# Patient Record
Sex: Female | Born: 1985
Health system: Southern US, Community
[De-identification: ages and names within clinical notes are randomized; demographics above are authoritative.]

## PROBLEM LIST (undated history)

## (undated) DIAGNOSIS — A630 Anogenital (venereal) warts: Secondary | ICD-10-CM

## (undated) HISTORY — PX: WISDOM TOOTH EXTRACTION: SHX21

## (undated) HISTORY — PX: TONSILLECTOMY AND ADENOIDECTOMY: SHX28

## (undated) HISTORY — PX: HYSTEROSCOPY: SHX211

## (undated) HISTORY — PX: NO PAST SURGERIES: SHX2092

---
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
## 2006-02-25 ENCOUNTER — Other Ambulatory Visit: Admission: RE | Admit: 2006-02-25 | Discharge: 2006-02-25 | Payer: Self-pay | Admitting: Obstetrics and Gynecology

## 2006-05-29 ENCOUNTER — Encounter: Admission: RE | Admit: 2006-05-29 | Discharge: 2006-05-29 | Payer: Self-pay | Admitting: Obstetrics and Gynecology

## 2009-04-06 ENCOUNTER — Encounter: Admission: RE | Admit: 2009-04-06 | Discharge: 2009-04-06 | Payer: Self-pay | Admitting: Family Medicine

## 2010-05-15 ENCOUNTER — Ambulatory Visit (HOSPITAL_COMMUNITY): Admission: RE | Admit: 2010-05-15 | Discharge: 2010-05-15 | Payer: Self-pay | Admitting: Obstetrics and Gynecology

## 2010-10-18 ENCOUNTER — Ambulatory Visit (HOSPITAL_COMMUNITY)
Admission: RE | Admit: 2010-10-18 | Discharge: 2010-10-18 | Payer: Self-pay | Source: Home / Self Care | Attending: Obstetrics and Gynecology | Admitting: Obstetrics and Gynecology

## 2010-11-05 NOTE — L&D Delivery Note (Signed)
Delivery Note At 5:18 PM a viable female was delivered via Vaginal, Spontaneous Delivery (Presentation: Right Occiput Anterior).  APGAR: 8, 9; weight 7 lb 12.5 oz (3530 g).   Placenta status: Intact, Spontaneous.  Cord: 3 vessels with the following complications: Nuchal  Anesthesia: Epidural  Episiotomy: none Lacerations: 1st degree;Labial Suture Repair: 3.0 vicryl rapide Est. Blood Loss (mL): 300  Mom to postpartum.  Baby to nursery-stable.  A -, RI, IUD? - check benefits, Breast Martha Coleman  Coleman,Martha Cardozo 06/12/2011, 5:51 PM

## 2011-02-10 LAB — HIV ANTIBODY (ROUTINE TESTING W REFLEX): HIV: NONREACTIVE

## 2011-04-12 LAB — STREP B DNA PROBE: GBS: NEGATIVE

## 2011-06-02 ENCOUNTER — Encounter (HOSPITAL_COMMUNITY): Payer: Self-pay | Admitting: *Deleted

## 2011-06-02 ENCOUNTER — Inpatient Hospital Stay (HOSPITAL_COMMUNITY)
Admission: AD | Admit: 2011-06-02 | Discharge: 2011-06-02 | Disposition: A | Discharge status: Home / Self Care | Payer: Medicaid Other | Source: Ambulatory Visit | Attending: Obstetrics and Gynecology | Admitting: Obstetrics and Gynecology

## 2011-06-02 DIAGNOSIS — O479 False labor, unspecified: Secondary | ICD-10-CM | POA: Insufficient documentation

## 2011-06-02 HISTORY — DX: Anogenital (venereal) warts: A63.0

## 2011-06-02 NOTE — Progress Notes (Signed)
Dr. Jackelyn Knife notified of cervical exam unchanged from previous exam. Plan to discharge patient home with labor precautions. Pain management discussed with pt and Dr. Rock Nephew declines the need for pain medication at this time.

## 2011-06-02 NOTE — Progress Notes (Signed)
Pt reports ctx every 3-4 min snce 0430. Reports some leaking of clear fluid no big gush

## 2011-06-02 NOTE — Progress Notes (Signed)
Dr. Jackelyn Knife notified of vaginal exam 3, 60 -2, Prior exam in the office was the same. Contraction are 2-4 mins apart mild/mod. bulging Bag of water felt on exam. Plan to keep pt 1 hr- allow her to walk and recheck cervix.

## 2011-06-02 NOTE — Progress Notes (Signed)
Pt presents to [facility] with complaints of contractions that started at 0400 this AM. Pt is a G2P0 at 38 weeks 2days.

## 2011-06-02 NOTE — Progress Notes (Signed)
Monitor removed for patient to walk times 1hr.

## 2011-06-11 ENCOUNTER — Other Ambulatory Visit: Payer: Self-pay | Admitting: Obstetrics and Gynecology

## 2011-06-12 ENCOUNTER — Inpatient Hospital Stay (HOSPITAL_COMMUNITY)
Admission: RE | Admit: 2011-06-12 | Discharge: 2011-06-14 | DRG: 775 | Disposition: A | Discharge status: Home / Self Care | Payer: Medicaid Other | Source: Ambulatory Visit | Attending: Obstetrics and Gynecology | Admitting: Obstetrics and Gynecology

## 2011-06-12 ENCOUNTER — Encounter (HOSPITAL_COMMUNITY): Payer: Self-pay

## 2011-06-12 ENCOUNTER — Encounter (HOSPITAL_COMMUNITY): Payer: Self-pay | Admitting: Anesthesiology

## 2011-06-12 ENCOUNTER — Inpatient Hospital Stay (HOSPITAL_COMMUNITY): Payer: Medicaid Other | Admitting: Anesthesiology

## 2011-06-12 DIAGNOSIS — Z3490 Encounter for supervision of normal pregnancy, unspecified, unspecified trimester: Secondary | ICD-10-CM

## 2011-06-12 LAB — CBC
HCT: 33 % — ABNORMAL LOW (ref 36.0–46.0)
RDW: 13 % (ref 11.5–15.5)
WBC: 12.9 10*3/uL — ABNORMAL HIGH (ref 4.0–10.5)

## 2011-06-12 LAB — RPR
RPR Ser Ql: NONREACTIVE
RPR: NONREACTIVE

## 2011-06-12 LAB — TYPE AND SCREEN: Antibody Screen: NEGATIVE

## 2011-06-12 MED ORDER — SENNOSIDES-DOCUSATE SODIUM 8.6-50 MG PO TABS
2.0000 | ORAL_TABLET | Freq: Every day | ORAL | Status: DC
  Administered 2011-06-12 – 2011-06-13 (×2): 2 via ORAL

## 2011-06-12 MED ORDER — OXYCODONE-ACETAMINOPHEN 5-325 MG PO TABS: 2.0000 | ORAL_TABLET | ORAL | Status: DC | PRN

## 2011-06-12 MED ORDER — SIMETHICONE 80 MG PO CHEW
80.0000 mg | CHEWABLE_TABLET | ORAL | Status: DC | PRN
  Administered 2011-06-12: 80 mg via ORAL

## 2011-06-12 MED ORDER — PHENYLEPHRINE 40 MCG/ML (10ML) SYRINGE FOR IV PUSH (FOR BLOOD PRESSURE SUPPORT)
PREFILLED_SYRINGE | INTRAVENOUS | Status: AC
  Filled 2011-06-12: qty 5

## 2011-06-12 MED ORDER — OXYTOCIN 20 UNITS IN LACTATED RINGERS INFUSION - SIMPLE: 125.0000 mL/h | Freq: Once | INTRAVENOUS | Status: DC

## 2011-06-12 MED ORDER — LIDOCAINE HCL (PF) 1 % IJ SOLN
30.0000 mL | INTRAMUSCULAR | Status: DC | PRN
  Administered 2011-06-12: 30 mL via SUBCUTANEOUS
  Filled 2011-06-12: qty 30

## 2011-06-12 MED ORDER — PRENATAL PLUS 27-1 MG PO TABS: 1.0000 | ORAL_TABLET | Freq: Every day | ORAL | Status: DC

## 2011-06-12 MED ORDER — OXYTOCIN 20 UNITS IN LACTATED RINGERS INFUSION - SIMPLE: 125.0000 mL/h | INTRAVENOUS | Status: DC | PRN

## 2011-06-12 MED ORDER — LIDOCAINE HCL 1.5 % IJ SOLN
INTRAMUSCULAR | Status: DC | PRN
  Administered 2011-06-12 (×2): 5 mL via EPIDURAL

## 2011-06-12 MED ORDER — DIBUCAINE 1 % RE OINT: 1.0000 "application " | TOPICAL_OINTMENT | RECTAL | Status: DC | PRN

## 2011-06-12 MED ORDER — CITRIC ACID-SODIUM CITRATE 334-500 MG/5ML PO SOLN: 30.0000 mL | ORAL | Status: DC | PRN

## 2011-06-12 MED ORDER — FENTANYL 2.5 MCG/ML BUPIVACAINE 1/10 % EPIDURAL INFUSION (WH - ANES)
INTRAMUSCULAR | Status: AC
  Filled 2011-06-12: qty 60

## 2011-06-12 MED ORDER — LIDOCAINE HCL (PF) 1 % IJ SOLN: 30.0000 mL | INTRAMUSCULAR | Status: DC | PRN

## 2011-06-12 MED ORDER — SODIUM CHLORIDE 0.9 % IJ SOLN: 3.0000 mL | Freq: Two times a day (BID) | INTRAMUSCULAR | Status: DC

## 2011-06-12 MED ORDER — LANOLIN HYDROUS EX OINT: TOPICAL_OINTMENT | CUTANEOUS | Status: DC | PRN

## 2011-06-12 MED ORDER — LACTATED RINGERS IV SOLN: 500.0000 mL | INTRAVENOUS | Status: DC | PRN

## 2011-06-12 MED ORDER — ZOLPIDEM TARTRATE 5 MG PO TABS: 5.0000 mg | ORAL_TABLET | Freq: Every evening | ORAL | Status: DC | PRN

## 2011-06-12 MED ORDER — TERBUTALINE SULFATE 1 MG/ML IJ SOLN: 0.2500 mg | Freq: Once | INTRAMUSCULAR | Status: DC | PRN

## 2011-06-12 MED ORDER — IBUPROFEN 600 MG PO TABS: 600.0000 mg | ORAL_TABLET | Freq: Four times a day (QID) | ORAL | Status: DC | PRN

## 2011-06-12 MED ORDER — DIPHENHYDRAMINE HCL 25 MG PO CAPS: 25.0000 mg | ORAL_CAPSULE | Freq: Four times a day (QID) | ORAL | Status: DC | PRN

## 2011-06-12 MED ORDER — OXYTOCIN 10 UNIT/ML IJ SOLN
INTRAMUSCULAR | Status: AC
  Filled 2011-06-12: qty 2

## 2011-06-12 MED ORDER — EPHEDRINE 5 MG/ML INJ
INTRAVENOUS | Status: AC
  Filled 2011-06-12: qty 4

## 2011-06-12 MED ORDER — ONDANSETRON HCL 4 MG/2ML IJ SOLN: 4.0000 mg | Freq: Four times a day (QID) | INTRAMUSCULAR | Status: DC | PRN

## 2011-06-12 MED ORDER — CALCIUM CARBONATE ANTACID 500 MG PO CHEW: 1.0000 | CHEWABLE_TABLET | Freq: Every day | ORAL | Status: DC | PRN

## 2011-06-12 MED ORDER — FENTANYL 2.5 MCG/ML BUPIVACAINE 1/10 % EPIDURAL INFUSION (WH - ANES)
INTRAMUSCULAR | Status: DC | PRN
  Administered 2011-06-12: 16:00:00
  Administered 2011-06-12: 14 mL/h via EPIDURAL

## 2011-06-12 MED ORDER — ACETAMINOPHEN 325 MG PO TABS: 650.0000 mg | ORAL_TABLET | ORAL | Status: DC | PRN

## 2011-06-12 MED ORDER — FENTANYL 2.5 MCG/ML BUPIVACAINE 1/10 % EPIDURAL INFUSION (WH - ANES)
INTRAMUSCULAR | Status: AC
  Administered 2011-06-12: 14:00:00
  Filled 2011-06-12: qty 60

## 2011-06-12 MED ORDER — IBUPROFEN 600 MG PO TABS
600.0000 mg | ORAL_TABLET | Freq: Four times a day (QID) | ORAL | Status: DC
  Administered 2011-06-13 – 2011-06-14 (×7): 600 mg via ORAL
  Filled 2011-06-12 (×7): qty 1

## 2011-06-12 MED ORDER — ONDANSETRON HCL 4 MG PO TABS: 4.0000 mg | ORAL_TABLET | ORAL | Status: DC | PRN

## 2011-06-12 MED ORDER — OXYTOCIN 20 UNITS IN LACTATED RINGERS INFUSION - SIMPLE
1.0000 m[IU]/min | INTRAVENOUS | Status: DC
  Administered 2011-06-12: 333 m[IU]/min via INTRAVENOUS
  Administered 2011-06-12: 6 m[IU]/min via INTRAVENOUS
  Administered 2011-06-12: 2 m[IU]/min via INTRAVENOUS
  Filled 2011-06-12: qty 1000

## 2011-06-12 MED ORDER — WITCH HAZEL-GLYCERIN EX PADS: 1.0000 "application " | MEDICATED_PAD | CUTANEOUS | Status: DC | PRN

## 2011-06-12 MED ORDER — PRENATAL PLUS 27-1 MG PO TABS
1.0000 | ORAL_TABLET | Freq: Every day | ORAL | Status: DC
  Administered 2011-06-12 – 2011-06-14 (×3): 1 via ORAL
  Filled 2011-06-12 (×3): qty 1

## 2011-06-12 MED ORDER — SODIUM CHLORIDE 0.9 % IV SOLN: 250.0000 mL | INTRAVENOUS | Status: DC

## 2011-06-12 MED ORDER — FLEET ENEMA 7-19 GM/118ML RE ENEM: 1.0000 | ENEMA | RECTAL | Status: DC | PRN

## 2011-06-12 MED ORDER — IBUPROFEN 600 MG PO TABS
600.0000 mg | ORAL_TABLET | Freq: Four times a day (QID) | ORAL | Status: DC | PRN
  Administered 2011-06-12: 600 mg via ORAL
  Filled 2011-06-12: qty 1

## 2011-06-12 MED ORDER — LACTATED RINGERS IV SOLN
INTRAVENOUS | Status: DC
  Administered 2011-06-12: 1000 mL via INTRAVENOUS

## 2011-06-12 MED ORDER — LACTATED RINGERS IV SOLN: INTRAVENOUS | Status: DC

## 2011-06-12 MED ORDER — BENZOCAINE-MENTHOL 20-0.5 % EX AERO: 1.0000 "application " | INHALATION_SPRAY | CUTANEOUS | Status: DC | PRN

## 2011-06-12 MED ORDER — SODIUM CHLORIDE 0.9 % IJ SOLN: 3.0000 mL | INTRAMUSCULAR | Status: DC | PRN

## 2011-06-12 MED ORDER — OXYCODONE-ACETAMINOPHEN 5-325 MG PO TABS
2.0000 | ORAL_TABLET | ORAL | Status: DC | PRN
  Administered 2011-06-12: 2 via ORAL
  Filled 2011-06-12 (×2): qty 1

## 2011-06-12 MED ORDER — TETANUS-DIPHTH-ACELL PERTUSSIS 5-2.5-18.5 LF-MCG/0.5 IM SUSP
0.5000 mL | Freq: Once | INTRAMUSCULAR | Status: AC
  Administered 2011-06-13: 0.5 mL via INTRAMUSCULAR
  Filled 2011-06-12: qty 0.5

## 2011-06-12 NOTE — Anesthesia Procedure Notes (Signed)
Epidural Patient location during procedure: OB Start time: 06/12/2011 11:03 AM  Staffing Performed by: anesthesiologist   Preanesthetic Checklist Completed: patient identified, site marked, surgical consent, pre-op evaluation, timeout performed, IV checked, risks and benefits discussed and monitors and equipment checked  Epidural Patient position: sitting Prep: site prepped and draped and DuraPrep Patient monitoring: continuous pulse ox and blood pressure Approach: midline Injection technique: LOR air and LOR saline  Needle:  Needle type: Tuohy  Needle gauge: 17 G Needle length: 9 cm Needle insertion depth: 5 cm cm Catheter type: closed end flexible Catheter size: 19 Gauge Catheter at skin depth: 10 cm Test dose: negative  Assessment Events: blood not aspirated, injection not painful, no injection resistance, negative IV test and no paresthesia  Additional Notes Patient identified.  Risk benefits discussed including failed block, incomplete pain control, headache, nerve damage, paralysis, blood pressure changes, nausea, vomiting, reactions to medication both toxic or allergic, and postpartum back pain.  Patient expressed understanding and wished to proceed.  All questions were answered.  Sterile technique used throughout procedure and epidural site dressed with sterile barrier dressing. No paresthesia or other complications noted.The patient did not experience any signs of intravascular injection such as tinnitus or metallic taste in mouth nor signs of intrathecal spread such as rapid motor block. Please see nursing notes for vital signs.

## 2011-06-12 NOTE — Progress Notes (Signed)
AROM performed for copious clear fluid, w/o diff/comp SVE 5/80/-2 J BOVARD

## 2011-06-12 NOTE — Progress Notes (Signed)
Martha Coleman is a 25 y.o. G2P0010 at [redacted]w[redacted]d  admitted for induction of labor due to Elective at term.  Subjective: comf with epidural  Objective: BP 126/91  Pulse 83  Temp(Src) 98 F (36.7 C) (Tympanic)  Resp 20  Ht 5\' 4"  (1.626 m)  Wt 78.019 kg (172 lb)  BMI 29.52 kg/m2  SpO2 100%     gen NAD FHT:  FHR: 120-130 bpm, variability: moderate,  accelerations:  Present,  decelerations:  Absent UC:   regular, every 2-3 minutes SVE:   Dilation:7.8 Effacement (%): 90 Station: 0/+1 Exam by:: Bovard, MD  Labs: Lab Results  Component Value Date   WBC 12.9* 06/12/2011   HGB 11.1* 06/12/2011   HCT 33.0* 06/12/2011   MCV 82.5 06/12/2011   PLT 199 06/12/2011    Assessment / Plan: Induction of labor due to term with favorable cervix,  progressing well on pitocin  Labor: Progressing normally Preeclampsia:  no signs or symptoms of toxicity Fetal Wellbeing:  Category II Pain Control:  Epidural  Anticipated MOD:  NSVD  BOVARD,Serria Sloma 06/12/2011, 2:02 PM

## 2011-06-12 NOTE — Anesthesia Preprocedure Evaluation (Signed)
Anesthesia Evaluation  Name, MR# and DOB Patient awake  General Assessment Comment  Reviewed: Allergy & Precautions, H&P , Patient's Chart, lab work & pertinent test results  Airway Mallampati: II TM Distance: >3 FB Neck ROM: full    Dental No notable dental hx.    Pulmonary  clear to auscultation  pulmonary exam normalPulmonary Exam Normal breath sounds clear to auscultation none    Cardiovascular regular Normal    Neuro/Psych Negative Neurological ROS  Negative Psych ROS  GI/Hepatic/Renal negative GI ROS, negative Liver ROS, and negative Renal ROS (+)       Endo/Other  Negative Endocrine ROS (+)      Abdominal   Musculoskeletal   Hematology negative hematology ROS (+)   Peds  Reproductive/Obstetrics (+) Pregnancy    Anesthesia Other Findings             Anesthesia Physical Anesthesia Plan  ASA: II  Anesthesia Plan: Epidural   Post-op Pain Management:    Induction:   Airway Management Planned:   Additional Equipment:   Intra-op Plan:   Post-operative Plan:   Informed Consent: I have reviewed the patients History and Physical, chart, labs and discussed the procedure including the risks, benefits and alternatives for the proposed anesthesia with the patient or authorized representative who has indicated his/her understanding and acceptance.     Plan Discussed with:   Anesthesia Plan Comments:         Anesthesia Quick Evaluation  

## 2011-06-12 NOTE — H&P (Addendum)
TAJAI SUDER is a 25 y.o. female presenting for iol given term status and favorable cervix.  +FM, no LOF, no VB, occ ctx.  D/w pt iol inc R/B/A wishes to proceed.   History OB History    Grav Para Term Preterm Abortions TAB SAB Ect Mult Living   2 0 0 0 1 0 1 0 0 0     G1 SAB, G2 present; no abn pap, no STDs  Past Medical History  Diagnosis Date  . Genital warts   . 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 No past surgeries    Family History: Br Ca Social History:  reports that she has never smoked. She does not have any smokeless tobacco history on file. She reports that she does not drink alcohol or use illicit drugs. engaged  PNL A neg, Ab Scr neg, RI, HepbsAg neg, HIV neg, plt 199k, GBBS neg, RPR NR; Rhogam given, ASCUS HR HPV neg, GC-, Chl -, CF neg, AFP and 1st tri screen declined, glucola 121 1st tri [place] cwd EDC 8/9 [place] cwd, nl anat, Lt wall plac, female  Review of Systems  Constitutional: Negative.   HENT: Negative.   Eyes: Negative.   Respiratory: Negative.   Cardiovascular: Negative.   Gastrointestinal: Negative.   Genitourinary: Negative.   Musculoskeletal: Negative.   Skin: Negative.   Neurological: Negative.   Endo/Heme/Allergies: Negative.   Psychiatric/Behavioral: Negative.       Blood pressure 125/98, pulse 74, temperature 98.4 F (36.9 C), temperature source Oral, resp. rate 20, height 5\' 4"  (1.626 m), weight 78.019 kg (172 lb). Exam Physical Exam  Constitutional: She is oriented to person, place, and time. She appears well-developed and well-nourished.  HENT:  Head: Normocephalic and atraumatic.  Neck: Normal range of motion. Neck supple.  Cardiovascular: Normal rate and regular rhythm.   Respiratory: Effort normal and breath sounds normal.  GI: Soft.  Neurological: She is alert and oriented to person, place, and time. She has normal reflexes.     Assessment/Plan: 25yo G2P0010  at 39+ for iol given favorable cervix, term status Epidural for pain gbbs neg AROM and pitocin to augment   BOVARD,Kennia Vanvorst 06/12/2011, 9:11 AM

## 2011-06-13 LAB — CBC
HCT: 28 % — ABNORMAL LOW (ref 36.0–46.0)
Hemoglobin: 9.4 g/dL — ABNORMAL LOW (ref 12.0–15.0)
MCH: 28 pg (ref 26.0–34.0)
MCHC: 33.6 g/dL (ref 30.0–36.0)
MCV: 83.3 fL (ref 78.0–100.0)

## 2011-06-13 MED ORDER — OXYCODONE-ACETAMINOPHEN 5-325 MG PO TABS
1.0000 | ORAL_TABLET | ORAL | Status: DC | PRN
  Administered 2011-06-13 – 2011-06-14 (×3): 1 via ORAL
  Filled 2011-06-13 (×3): qty 1

## 2011-06-13 NOTE — Progress Notes (Signed)
Post Partum Day 1 Subjective: no complaints, tolerating PO and nl lochia and pain controlled.  Some cramping  Objective: Blood pressure 102/68, pulse 99, temperature 97.2 F (36.2 C), temperature source Oral, resp. rate 20, height 5\' 4"  (1.626 m), weight 78.019 kg (172 lb), SpO2 97.00%, unknown if currently breastfeeding.  Physical Exam:  General: alert and no distress Lochia: appropriate Uterine Fundus: firm    Basename 06/13/11 0512 06/12/11 0807  HGB 9.4* 11.1*  HCT 28.0* 33.0*    Assessment/Plan: Plan for discharge tomorrow and Contraception IUD Lactation consult.   LOS: 1 day   BOVARD,Kaisyn Reinhold 06/13/2011, 8:07 AM

## 2011-06-13 NOTE — Consult Note (Signed)
BREASTFEEDING CONSULTATION SERVICES INFORMATION LEFT WITH PATIENT.  PATIENT STATES BABY HAS BEEN SLEEPY AT FEEDINGS.  REVIEWED WAKING TECHNIQUES AND BASIC BREASTFEEDING TEACHING DONE.  BABY PLACED SKIN TO SKIN WITH GOOD FEEDING CUES.  BABY LATCHED EASILY AND NURSED WELL WITH GOOD SUCK/SWALLOWS.  COMFORT GELS GIVEN FOR SORE NIPPLES.  ENCOURAGED TO CALL FOR CONCERNS/ASSIST.

## 2011-06-13 NOTE — Progress Notes (Signed)
UR chart review completed.  

## 2011-06-14 MED ORDER — OXYCODONE-ACETAMINOPHEN 5-325 MG PO TABS: 1.0000 | ORAL_TABLET | ORAL | Status: AC | PRN

## 2011-06-14 MED ORDER — PRENATAL PLUS 27-1 MG PO TABS: 1.0000 | ORAL_TABLET | Freq: Every day | ORAL | Status: DC

## 2011-06-14 MED ORDER — IBUPROFEN 600 MG PO TABS: 600.0000 mg | ORAL_TABLET | Freq: Four times a day (QID) | ORAL | Status: AC

## 2011-06-14 NOTE — Progress Notes (Signed)
Mother has flat nipples and has bilateral positional strips. Infant has have only shallow latch. Multiple attempts until latch was sustained. Infant breast fed for 20-25 mins. inst mother in use of nipple shield , 2ml of EBM placed in NS and infant maintained good sucking for 3-5 mins. inst mother to only use nipple shield if unable to latch infant. inst to feed infant any amt of EBM pumped every 3 hrs. inst mother in hand exp and breast compression inst mother to pre pump piror to attempting to latch.. Mother rented [facility] loaner and also schedules for fup lc visit. August 15 at 2:30. Written plan given to mother . Mother states good understanding of plan.

## 2011-06-14 NOTE — Anesthesia Postprocedure Evaluation (Signed)
  Anesthesia Post Note  Patient: Martha Coleman  Procedure(s) Performed: * No procedures listed *  Anesthesia type: Epidural  Patient location: Mother/Baby  Post pain: Pain level controlled  Post assessment: Post-op Vital signs reviewed  Last Vitals:  Filed Vitals:   06/14/11 0624  BP: 107/72  Pulse: 80  Temp: 98.1 F (36.7 C)  Resp: 16    Post vital signs: Reviewed  Level of consciousness: awake  Complications: No apparent anesthesia complications

## 2011-06-14 NOTE — Discharge Summary (Signed)
Obstetric Discharge Summary Reason for Admission: induction of labor Prenatal Procedures: none Intrapartum Procedures: spontaneous vaginal delivery Postpartum Procedures: none Complications-Operative and Postpartum: vaginal laceration Hemoglobin  Date Value Range Status  06/13/2011 9.4* 12.0-15.0 (g/dL) Final     HCT  Date Value Range Status  06/13/2011 28.0* 36.0-46.0 (%) Final    Discharge Diagnoses: Term Pregnancy-delivered  Discharge Information: Date: 06/14/2011 Activity: pelvic rest Diet: routine Medications: PNV, Ibuprophen and Percocet Condition: stable Instructions: refer to practice specific booklet Discharge to: home   Newborn Data: Live born female  Birth Weight: 7 lb 12.5 oz (3530 g) APGAR: 8, 9  Home with mother.  Coleman,Martha Lightsey 06/14/2011, 7:18 AM

## 2011-06-14 NOTE — Progress Notes (Signed)
Post Partum Day 2 Subjective: no complaints and tolerating POnl lochia, pain controlled  Objective: Blood pressure 107/72, pulse 80, temperature 98.1 F (36.7 C), temperature source Oral, resp. rate 16, height 5\' 4"  (1.626 m), weight 78.019 kg (172 lb), SpO2 97.00%, unknown if currently breastfeeding.  Physical Exam:  General: alert and no distress Lochia: appropriate Uterine Fundus: firm    Basename 06/13/11 0512 06/12/11 0807  HGB 9.4* 11.1*  HCT 28.0* 33.0*    Assessment/Plan: Discharge home with Motrin, Persocet, PNV, f/u 6 weeks.     LOS: 2 days   BOVARD,Aliscia Clayton 06/14/2011, 6:52 AM

## 2011-06-20 ENCOUNTER — Ambulatory Visit (HOSPITAL_COMMUNITY)
Admit: 2011-06-20 | Discharge: 2011-06-20 | Disposition: A | Discharge status: Home / Self Care | Payer: Medicaid Other | Attending: Obstetrics and Gynecology | Admitting: Obstetrics and Gynecology

## 2011-06-20 NOTE — Progress Notes (Signed)
Adult Lactation Consultation Outpatient Visit Note  Patient Name: Martha Coleman Date of Birth: Sep 24, 1986 Gestational Age at Delivery: Unknown Type of Delivery:   Breastfeeding History: Frequency of Breastfeeding:Can only get to latch 2 times a day  Length of Feeding:Maybe 10 minutes, but not sucking the whole time  Voids: 10 saturated Stools: 5 to 6 yellow mustard color seedy  Supplementing / Method: Pumping:  Type of Pump:Got Lactina pump Monday - Was using Harmony before then   Frequency:Trying to pump every 2 1/2 to 3 hours .  Volume:  3 1/2 oz total (Feeding baby 2 1/2 oz every 3 hours).  Before mature milk came in was only pumping 1 oz.  Comments:  Mom has been having latch issues since birth d/t flat nipples and causing mom nipple tenderness.  Has been using # 24 nipple shield, but Martha Coleman usually refuses.  Has only been able to get her to breastfeed maybe twice a day leaving her on the breast for 10 minutes, but not sucking the whole time.  Was seen by home visit nurse today and had gained 1 1/2 oz over birth weight (7lb 13 1/2 oz) totally nude.  Martha Coleman had jaundice probably d/t poor latching and not enough milk exchange.  When Martha Coleman was discharged from the hospital her bilirubin was 6.4.  The next day at the [facility] office it was 16.4 resulting in more lethargy and poor feeding.  Supplementation started that day of 1/2 formula and 1/2 breastmilk.  The day supplementation was started, Martha Coleman was seen back in the hospital for throwing up per mom and bili was down to 13 then.  Martha Coleman got used to bottles and then really had latch issues.  Mom's mature milk transitioned in 2 days ago.  Mom's full breasts interfered more with Martha Coleman's latch.  Came in for consult today at 29 days old to get her back to the breast if possible.   Consultation Evaluation: Pre pumped mom to help evert nipple and initiate flow and got 25ml from left breast after 5 minutes.  Shown how to sandwich breast.  Martha Coleman latched  without aid of nipple shield and began good rhythmic sucking and swallowing.  Took 34ml from left breast after 10 to 15 minutes sucking for most of the time at the breast stimulating occasionally and massaging breast to keep sucking.  Mom latched Martha Coleman onto right breast without assistance from [facility] pre pumping and sandwiching breast as demonstrated on first breast and she took 28ml after 10 minutes and came off satiated taking a total of 62ml.  According to present weight calculations, Martha Coleman should be taking in 2.6 oz every 3 hours to continue to gain weight and grow.      Initial Feeding Assessment: Pre-feed Weight:3574gms (7-14.2 oz) Post-feed Weight:3602 gms (7-15.3 oz) Amount Transferred:68ml Comments:6ml taken from left breast after 10 to 15 minutes.  Changed moderate yellow seedy stool before feeding and large yellow seedy stool between breasts  Additional Feeding Assessment: Pre-feed Weight:3590 gms(7-14.5) Post-feed Weight:3618 gms (7-15.5 Amount Transferred:41ml Comments:75ml taken from right breast after 10 minutes.  Changed another moderate yellow seedy stool after right breast  Additional Feeding Assessment: Pre-feed Weight: Post-feed Weight: Amount Transferred: Comments:  Total Breast milk Transferred this Visit: 62ml (gms) Total Supplement Given:   Additional Interventions:  Encouraged mom to continue to put Martha Coleman to the breast even if she requests to feed less than every 2 to 3 hours for the next 24 to 48 hours to get her used  to solely breastfeeding only giving the bottle (expressed breastmilk) if absolutely necessary.  Continue to pre pump if needed to help nipple evert and initiate flow.  Decreased nipple size to #20 from #24, but only use if cannot achieve latch otherwise.  If cannot get to latch with or without nipple shield, give 2.5 to 3 oz via bottle.  Signs of adequate intake reviewed with mom and told to call with any problems, concerns or if needed earlier f/u lactation  out patient consult.      Follow-Up  Set up f/u out patient lactation consult for August 21st at 4pm.  But if concerned before then about adequate intake, please call. Has f/u 1 month Pediatric appointment with Dr. Genelle Bal at [facility].    Louis Meckel 06/20/2011, 2:30 PM

## 2011-06-26 ENCOUNTER — Ambulatory Visit (HOSPITAL_COMMUNITY): Payer: Medicaid Other | Attending: Obstetrics and Gynecology

## 2013-06-01 ENCOUNTER — Ambulatory Visit: Payer: Self-pay

## 2013-06-01 ENCOUNTER — Ambulatory Visit (INDEPENDENT_AMBULATORY_CARE_PROVIDER_SITE_OTHER): Payer: No Typology Code available for payment source | Admitting: Family Medicine

## 2013-06-01 ENCOUNTER — Ambulatory Visit: Payer: No Typology Code available for payment source

## 2013-06-01 VITALS — BP 110/66 | HR 71 | Temp 98.4°F | Resp 18 | Ht 64.0 in | Wt 162.0 lb

## 2013-06-01 DIAGNOSIS — S93409A Sprain of unspecified ligament of unspecified ankle, initial encounter: Secondary | ICD-10-CM

## 2013-06-01 DIAGNOSIS — M79609 Pain in unspecified limb: Secondary | ICD-10-CM

## 2013-06-01 MED ORDER — HYDROCODONE-ACETAMINOPHEN 5-325 MG PO TABS: 1.0000 | ORAL_TABLET | Freq: Three times a day (TID) | ORAL | Status: DC | PRN

## 2013-06-01 MED ORDER — TRAMADOL HCL 50 MG PO TABS: 50.0000 mg | ORAL_TABLET | Freq: Three times a day (TID) | ORAL | Status: DC | PRN

## 2013-06-01 NOTE — Patient Instructions (Addendum)
Please let me know if your ankle is not better in the next several days.   Elevated and ice your ankle as needed- try to ice it at least twice a day Use your ankle support as needed.    Use the tramadol as needed, but it can cause drowsiness so use caution.

## 2013-06-01 NOTE — Progress Notes (Signed)
[facility] [address] [place] [address] [phone]  Date:  06/01/2013   Name:  Martha Coleman   DOB:  09-25-1986   MRN:  [id number]  PCP:  No PCP Per Patient    Chief Complaint: injury to left ankle saturday   History of Present Illness:  CARINE NORDGREN is a 27 y.o. very pleasant female patient who presents with the following:  2 days ago she was working out in the yard and injured her right ankle. She is not sure exactly what happened to her ankle, but she twisted it somehow and fell.  She is able to walk on it, but it does hurt to dorsiflex the foot.  She has more pain over the lateral and dorsal foot.   She is otherwise generally healthy.   She had a baby 2 years ago and is not breastfeeding at this time.  She is using ibuprofen as needed for her ankle pain  She has an IUD.    Patient Active Problem List   Diagnosis Date Noted  . SVD (spontaneous vaginal delivery) 06/12/2011    Past Medical History  Diagnosis Date  . Genital warts   . Normal pregnancy 06/12/2011    Past Surgical History  Procedure Laterality Date  . No past surgeries      History  Substance Use Topics  . Smoking status: Never Smoker   . Smokeless tobacco: Not on file  . Alcohol Use: No    History reviewed. No pertinent family history.  No Known Allergies  Medication list has been reviewed and updated.  Current Outpatient Prescriptions on File Prior to Visit  Medication Sig Dispense Refill  . acetaminophen (TYLENOL) 500 MG tablet Take 500 mg by mouth every 6 (six) hours as needed. For pain       . calcium carbonate (TUMS - DOSED IN MG ELEMENTAL CALCIUM) 500 MG chewable tablet Chew 1 tablet by mouth daily as needed. For heartburn      . calcium carbonate (TUMS - DOSED IN MG ELEMENTAL CALCIUM) 500 MG chewable tablet Chew 1 tablet by mouth daily as needed. For heartburn       . prenatal vitamin w/FE, FA (PRENATAL 1 + 1) 27-1 MG TABS Take 1 tablet by mouth daily.        . prenatal vitamin w/FE, FA (PRENATAL 1 + 1) 27-1 MG TABS Take 1 tablet by mouth daily.        . prenatal vitamin w/FE, FA (PRENATAL 1 + 1) 27-1 MG TABS Take 1 tablet by mouth daily.  100 each  3   No current facility-administered medications on file prior to visit.    Review of Systems:  As per HPI- otherwise negative.   Physical Examination: Filed Vitals:   06/01/13 1624  BP: 110/66  Pulse: 71  Temp: 98.4 F (36.9 C)  Resp: 18   Filed Vitals:   06/01/13 1624  Height: 5\' 4"  (1.626 m)  Weight: 162 lb (73.483 kg)   Body mass index is 27.79 kg/(m^2). Ideal Body Weight: Weight in (lb) to have BMI = 25: 145.3  GEN: WDWN, NAD, Non-toxic, A & O x 3, looks well HEENT: Atraumatic, Normocephalic. Neck supple. No masses, No LAD. Ears and Nose: No external deformity. CV: RRR, No M/G/R. No JVD. No thrill. No extra heart sounds. PULM: CTA B, no wheezes, crackles, rhonchi. No retractions. No resp. distress. No accessory muscle use. EXTR: No c/c/e NEURO minimally favoring left leg  PSYCH: Normally interactive. Conversant. Not depressed or anxious appearing.  Calm demeanor.  Left lower extremity:  Minimal swelling, some tenderness over the lateral ankle.  No bruise.  Normal cap refill and ankle function.  Normal DP pulse  [facility] reading (PRIMARY) by  Dr. Patsy Lager. Left foot: negative  Left ankle: negative  LEFT FOOT - COMPLETE 3+ VIEW  Comparison: None.  Findings: There is no evidence of fracture or dislocation. There is no evidence of arthropathy or other focal bone abnormality. Soft tissues are unremarkable.  IMPRESSION: Negative.  LEFT ANKLE COMPLETE - 3+ VIEW  Comparison: None.  Findings: There is no evidence of fracture, dislocation, or joint effusion. There is no evidence of arthropathy or other focal bone abnormality. Soft tissues are unremarkable.  IMPRESSION: Negative.    Assessment and Plan: Sprain of ankle, unspecified site - Plan: DG Ankle Complete Left,  DG Foot Complete Left, HYDROcodone-acetaminophen (NORCO/VICODIN) 5-325 MG per tablet, DISCONTINUED: traMADol (ULTRAM) 50 MG tablet  Ankle sprain.  Applied sweedo, she declined crutches or work note.  She will ice and elevate her ankle, and let me know if not better in the next few days.  Sooner if worse.  Given a small supply of vicodin to use at night as needed for pain.      Signed Abbe Amsterdam, MD

## 2014-11-24 ENCOUNTER — Encounter: Payer: Self-pay | Admitting: Neurology

## 2014-11-24 ENCOUNTER — Ambulatory Visit (INDEPENDENT_AMBULATORY_CARE_PROVIDER_SITE_OTHER): Payer: BLUE CROSS/BLUE SHIELD | Admitting: Neurology

## 2014-11-24 ENCOUNTER — Telehealth: Payer: Self-pay | Admitting: *Deleted

## 2014-11-24 VITALS — BP 118/66 | HR 88 | Temp 98.7°F | Resp 16 | Ht 64.0 in | Wt 169.6 lb

## 2014-11-24 DIAGNOSIS — G4440 Drug-induced headache, not elsewhere classified, not intractable: Secondary | ICD-10-CM

## 2014-11-24 DIAGNOSIS — M542 Cervicalgia: Secondary | ICD-10-CM

## 2014-11-24 DIAGNOSIS — H538 Other visual disturbances: Secondary | ICD-10-CM

## 2014-11-24 DIAGNOSIS — G4441 Drug-induced headache, not elsewhere classified, intractable: Secondary | ICD-10-CM

## 2014-11-24 DIAGNOSIS — M5481 Occipital neuralgia: Secondary | ICD-10-CM

## 2014-11-24 NOTE — Patient Instructions (Signed)
The headache in the back of your head could be occipital neuralgia or from the neck itself.  I think you would benefit from physical therapy of the neck. The constant dull headache is likely related to medication overuse.  I would limit use of pain relievers to no more than 2 days out of the week. We will refer you for a formal eye exam regarding blurred vision  Follow up in 6-8 weeks.

## 2014-11-24 NOTE — Telephone Encounter (Signed)
[facility]06/14 at 8:30 am directions and telephone number was given  records faxed  to [phone]

## 2014-11-24 NOTE — Progress Notes (Signed)
NEUROLOGY CONSULTATION NOTE  Martha Coleman MRN: [id number] DOB: October 10, 1986  Referring provider: Carilyn Goodpasture, PA-C Primary care provider: Farris Has, MD  Reason for consult:  Headache, occipital neuralgia  HISTORY OF PRESENT ILLNESS: Martha Coleman is a 29 year old right-handed woman who presents for left occipital neuralgia.  Records reviewed.  Since August or September, she had had intermittent episodes of sharp pain in the left occipital region, lasting a few seconds and occuring about every 2 minutes.  This would typically last for one to one and a half days.  She has had about 4-5 episodes since August.  It is not associated with burning, numbness or tingling in the back of her head.  She does note some left-sided neck tightness and tenderness, and the pain can be exacerbated by neck movement to either side.  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  When she looks at the screen, her vision seems blurry and she has to squint.  She never tried closing either eye to see if it resolves.  This is not constant, however.  There is no associated vertigo, focal numbness or weakness or ataxia.  She also has been experiencing a constant dull bi-temporal headache for the same period of time.  It is associated with some photosensitivity but no nausea or phonophobia.  She has been taking either Tylenol, Excedrin or ibuprofen daily.  She was prescribed gabapentin but has not taken it.  She prefers not to take any medications as she is currently trying to get pregnant.   She denies prior history of headache.  Her brother has history of migraine.  There is no family history of intracranial aneurysms, but she says her mother has a "blood clotting disorder".  She is not sure specifically what this is.  PAST MEDICAL HISTORY: Past Medical History  Diagnosis Date  . Genital warts   . Normal pregnancy 06/12/2011    PAST SURGICAL HISTORY: Past Surgical History  Procedure Laterality  Date  . No past surgeries      MEDICATIONS: Current Outpatient Prescriptions on File Prior to Visit  Medication Sig Dispense Refill  . acetaminophen (TYLENOL) 500 MG tablet Take 500 mg by mouth every 6 (six) hours as needed. For pain     . prenatal vitamin w/FE, FA (PRENATAL 1 + 1) 27-1 MG TABS Take 1 tablet by mouth daily.     . SERTRALINE HCL PO Take by mouth.    . calcium carbonate (TUMS - DOSED IN MG ELEMENTAL CALCIUM) 500 MG chewable tablet Chew 1 tablet by mouth daily as needed. For heartburn    . calcium carbonate (TUMS - DOSED IN MG ELEMENTAL CALCIUM) 500 MG chewable tablet Chew 1 tablet by mouth daily as needed. For heartburn     . HYDROcodone-acetaminophen (NORCO/VICODIN) 5-325 MG per tablet Take 1 tablet by mouth every 8 (eight) hours as needed for pain. (Patient not taking: Reported on 11/24/2014) 8 tablet 0  . prenatal vitamin w/FE, FA (PRENATAL 1 + 1) 27-1 MG TABS Take 1 tablet by mouth daily.      . prenatal vitamin w/FE, FA (PRENATAL 1 + 1) 27-1 MG TABS Take 1 tablet by mouth daily. (Patient not taking: Reported on 11/24/2014) 100 each 3   No current facility-administered medications on file prior to visit.    ALLERGIES: Allergies  Allergen Reactions  . Doxapap-N [Propoxyphene]     FAMILY HISTORY: Family History  Problem Relation Age of Onset  . Diabetes Maternal  Grandmother   . Hypertension Maternal Grandmother     SOCIAL HISTORY: History   Social History  . Marital Status: Single    Spouse Name: N/A    Number of Children: N/A  . Years of Education: N/A   Occupational History  . Not on file.   Social History Main Topics  . Smoking status: Never Smoker   . Smokeless tobacco: Not on file  . Alcohol Use: 0.0 oz/week    0 Not specified per week     Comment: social   . Drug Use: No  . Sexual Activity:    Partners: Male   Other Topics Concern  . Not on file   Social History Narrative    REVIEW OF SYSTEMS: Constitutional: No fevers, chills, or  sweats, no generalized fatigue, change in appetite Eyes: No visual changes, double vision, eye pain Ear, nose and throat: No hearing loss, ear pain, nasal congestion, sore throat Cardiovascular: No chest pain, palpitations Respiratory:  No shortness of breath at rest or with exertion, wheezes GastrointestinaI: No nausea, vomiting, diarrhea, abdominal pain, fecal incontinence Genitourinary:  No dysuria, urinary retention or frequency Musculoskeletal:  No neck pain, back pain Integumentary: No rash, pruritus, skin lesions Neurological: as above Psychiatric: No depression, insomnia, anxiety Endocrine: No palpitations, fatigue, diaphoresis, mood swings, change in appetite, change in weight, increased thirst Hematologic/Lymphatic:  No anemia, purpura, petechiae. Allergic/Immunologic: no itchy/runny eyes, nasal congestion, recent allergic reactions, rashes  PHYSICAL EXAM: Filed Vitals:   11/24/14 0807  BP: 118/66  Pulse: 88  Temp: 98.7 F (37.1 C)  Resp: 16   General: No acute distress Head:  Normocephalic/atraumatic Eyes:  fundi unremarkable, without vessel changes, exudates, hemorrhages or papilledema. Neck: supple, left sided paraspinal tenderness, full range of motion.  Mild tenderness to palpation of the left suboccipital region. Back: No paraspinal tenderness Heart: regular rate and rhythm Lungs: Clear to auscultation bilaterally. Vascular: No carotid bruits. Neurological Exam: Mental status: alert and oriented to person, place, and time, recent and remote memory intact, fund of knowledge intact, attention and concentration intact, speech fluent and not dysarthric, language intact. Cranial nerves: CN I: not tested CN II: pupils equal, round and reactive to light, visual fields intact, fundi unremarkable, without vessel changes, exudates, hemorrhages or papilledema. CN III, IV, VI:  full range of motion, no nystagmus, no ptosis CN V: facial sensation intact CN VII: upper and  lower face symmetric CN VIII: hearing intact CN IX, X: gag intact, uvula midline CN XI: sternocleidomastoid and trapezius muscles intact CN XII: tongue midline Bulk & Tone: normal, no fasciculations. Motor:  5/5 throughout Sensation:  Temperature and vibration intact Deep Tendon Reflexes:  2+ throughout, toes downgoing Finger to nose testing:  No dysmetria Heel to shin:  No dysmetria Gait:  Normal station and stride.  Able to turn and walk in tandem. Romberg negative.  IMPRESSION: 1.  Left occipital neuralgia versus cervicogenic headache. 2.  Medication overuse headache.  PLAN: 1.  As she prefers not to take any medications, I feel that PT of the neck would be beneficial.  I don't think an occipital nerve block would be helpful because it occurs intermittently and lasts. 2.  Must limit use of pain relievers to no more than 2 days out of the week to prevent medication overuse headache. 3.  Since these headaches seem consistent with these diagnoses and she has a normal exam, I don't think imaging is warranted at this time.  She will follow up in 6 to 8  weeks.  If the headaches persist, we can consider MRI of the brain with a nd without contrast at that time.  45 minutes spent with patient, over 50% spent discussing diagnoses and coordinating care.  Thank you for allowing me to take part in the care of this patient.  Shon Millet, DO  CC:  Carilyn Goodpasture, PA-C  Farris Has, MD

## 2014-12-27 ENCOUNTER — Telehealth: Payer: Self-pay | Admitting: Neurology

## 2014-12-27 NOTE — Telephone Encounter (Signed)
Pt canceled appt to see Dr Everlena CooperJaffe on 01-07-15 for a follow up and will call back to resch

## 2015-01-07 ENCOUNTER — Ambulatory Visit: Payer: BLUE CROSS/BLUE SHIELD | Admitting: Neurology

## 2015-03-23 ENCOUNTER — Emergency Department (HOSPITAL_COMMUNITY): Payer: Medicaid Other

## 2015-03-23 ENCOUNTER — Emergency Department (HOSPITAL_COMMUNITY)
Admission: EM | Admit: 2015-03-23 | Discharge: 2015-03-23 | Disposition: A | Discharge status: Home / Self Care | Payer: Medicaid Other | Attending: Emergency Medicine | Admitting: Emergency Medicine

## 2015-03-23 ENCOUNTER — Encounter (HOSPITAL_COMMUNITY): Payer: Self-pay

## 2015-03-23 DIAGNOSIS — Z3202 Encounter for pregnancy test, result negative: Secondary | ICD-10-CM | POA: Diagnosis not present

## 2015-03-23 DIAGNOSIS — Z79899 Other long term (current) drug therapy: Secondary | ICD-10-CM | POA: Diagnosis not present

## 2015-03-23 DIAGNOSIS — R42 Dizziness and giddiness: Secondary | ICD-10-CM | POA: Diagnosis not present

## 2015-03-23 DIAGNOSIS — R110 Nausea: Secondary | ICD-10-CM | POA: Diagnosis not present

## 2015-03-23 DIAGNOSIS — Z8619 Personal history of other infectious and parasitic diseases: Secondary | ICD-10-CM | POA: Diagnosis not present

## 2015-03-23 LAB — URINALYSIS, ROUTINE W REFLEX MICROSCOPIC
Bilirubin Urine: NEGATIVE
Glucose, UA: NEGATIVE mg/dL
Hgb urine dipstick: NEGATIVE
KETONES UR: NEGATIVE mg/dL
Leukocytes, UA: NEGATIVE
NITRITE: NEGATIVE
PH: 8 (ref 5.0–8.0)
Protein, ur: NEGATIVE mg/dL
SPECIFIC GRAVITY, URINE: 1.012 (ref 1.005–1.030)
UROBILINOGEN UA: 0.2 mg/dL (ref 0.0–1.0)

## 2015-03-23 LAB — CBC WITH DIFFERENTIAL/PLATELET
Basophils Absolute: 0 10*3/uL (ref 0.0–0.1)
Basophils Relative: 0 % (ref 0–1)
EOS ABS: 0.2 10*3/uL (ref 0.0–0.7)
Eosinophils Relative: 2 % (ref 0–5)
HEMATOCRIT: 42.1 % (ref 36.0–46.0)
Hemoglobin: 14.8 g/dL (ref 12.0–15.0)
Lymphocytes Relative: 18 % (ref 12–46)
Lymphs Abs: 1.4 10*3/uL (ref 0.7–4.0)
MCH: 31.1 pg (ref 26.0–34.0)
MCHC: 35.2 g/dL (ref 30.0–36.0)
MCV: 88.4 fL (ref 78.0–100.0)
MONOS PCT: 5 % (ref 3–12)
Monocytes Absolute: 0.4 10*3/uL (ref 0.1–1.0)
NEUTROS ABS: 5.9 10*3/uL (ref 1.7–7.7)
Neutrophils Relative %: 75 % (ref 43–77)
Platelets: 251 10*3/uL (ref 150–400)
RBC: 4.76 MIL/uL (ref 3.87–5.11)
RDW: 11.8 % (ref 11.5–15.5)
WBC: 7.9 10*3/uL (ref 4.0–10.5)

## 2015-03-23 LAB — BASIC METABOLIC PANEL
ANION GAP: 7 (ref 5–15)
BUN: 10 mg/dL (ref 6–20)
CALCIUM: 9.4 mg/dL (ref 8.9–10.3)
CO2: 28 mmol/L (ref 22–32)
Chloride: 106 mmol/L (ref 101–111)
Creatinine, Ser: 0.63 mg/dL (ref 0.44–1.00)
GFR calc Af Amer: >60 mL/min (ref >60)
GFR calc non Af Amer: >60 mL/min (ref >60)
Glucose, Bld: 94 mg/dL (ref 65–99)
Potassium: 4 mmol/L (ref 3.5–5.1)
SODIUM: 141 mmol/L (ref 135–145)

## 2015-03-23 LAB — POC URINE PREG, ED: PREG TEST UR: NEGATIVE

## 2015-03-23 MED ORDER — MECLIZINE HCL 25 MG PO TABS
25.0000 mg | ORAL_TABLET | Freq: Once | ORAL | Status: AC
  Administered 2015-03-23: 25 mg via ORAL
  Filled 2015-03-23: qty 1

## 2015-03-23 MED ORDER — IOHEXOL 350 MG/ML SOLN
100.0000 mL | Freq: Once | INTRAVENOUS | Status: AC | PRN
  Administered 2015-03-23: 100 mL via INTRAVENOUS

## 2015-03-23 NOTE — Discharge Instructions (Signed)
Benign Positional Vertigo °Vertigo means you feel like you or your surroundings are moving when they are not. Benign positional vertigo is the most common form of vertigo. Benign means that the cause of your condition is not serious. Benign positional vertigo is more common in older adults. °CAUSES  °Benign positional vertigo is the result of an upset in the labyrinth system. This is an area in the middle ear that helps control your balance. This may be caused by a viral infection, head injury, or repetitive motion. However, often no specific cause is found. °SYMPTOMS  °Symptoms of benign positional vertigo occur when you move your head or eyes in different directions. Some of the symptoms may include: °1. Loss of balance and falls. °2. Vomiting. °3. Blurred vision. °4. Dizziness. °5. Nausea. °6. Involuntary eye movements (nystagmus). °DIAGNOSIS  °Benign positional vertigo is usually diagnosed by physical exam. If the specific cause of your benign positional vertigo is unknown, your caregiver may perform imaging tests, such as magnetic resonance imaging (MRI) or computed tomography (CT). °TREATMENT  °Your caregiver may recommend movements or procedures to correct the benign positional vertigo. Medicines such as meclizine, benzodiazepines, and medicines for nausea may be used to treat your symptoms. In rare cases, if your symptoms are caused by certain conditions that affect the inner ear, you may need surgery. °HOME CARE INSTRUCTIONS  °· Follow your caregiver's instructions. °· Move slowly. Do not make sudden body or head movements. °· Avoid driving. °· Avoid operating heavy machinery. °· Avoid performing any tasks that would be dangerous to you or others during a vertigo episode. °· Drink enough fluids to keep your urine clear or pale yellow. °SEEK IMMEDIATE MEDICAL CARE IF:  °· You develop problems with walking, weakness, numbness, or using your arms, hands, or legs. °· You have difficulty speaking. °· You develop  severe headaches. °· Your nausea or vomiting continues or gets worse. °· You develop visual changes. °· Your family or friends notice any behavioral changes. °· Your condition gets worse. °· You have a fever. °· You develop a stiff neck or sensitivity to light. °MAKE SURE YOU:  °· Understand these instructions. °· Will watch your condition. °· Will get help right away if you are not doing well or get worse. °Document Released: 07/30/2006 Document Revised: 01/14/2012 Document Reviewed: 07/12/2011 °ExitCare® Patient Information ©2015 [employer]. This information is not intended to replace advice given to you by your health care provider. Make sure you discuss any questions you have with your health care provider. ° °Epley Maneuver Self-Care °WHAT IS THE EPLEY MANEUVER? °The Epley maneuver is an exercise you can do to relieve symptoms of benign paroxysmal positional vertigo (BPPV). This condition is often just referred to as vertigo. BPPV is caused by the movement of tiny crystals (canaliths) inside your inner ear. The accumulation and movement of canaliths in your inner ear causes a sudden spinning sensation (vertigo) when you move your head to certain positions. Vertigo usually lasts about 30 seconds. BPPV usually occurs in just one ear. If you get vertigo when you lie on your left side, you probably have BPPV in your left ear. Your health care provider can tell you which ear is involved.  °BPPV may be caused by a head injury. Many people older than 50 get BPPV for unknown reasons. If you have been diagnosed with BPPV, your health care provider may teach you how to do this maneuver. BPPV is not life threatening (benign) and usually goes away in time.  °  WHEN SHOULD I PERFORM THE EPLEY MANEUVER? °You can do this maneuver at home whenever you have symptoms of vertigo. You may do the Epley maneuver up to 3 times a day until your symptoms of vertigo go away. °HOW SHOULD I DO THE EPLEY MANEUVER? °7. Sit on the edge of a  bed or table with your back straight. Your legs should be extended or hanging over the edge of the bed or table.   °8. Turn your head halfway toward the affected ear.   °9. Lie backward quickly with your head turned until you are lying flat on your back. You may want to position a pillow under your shoulders.   °10. Hold this position for 30 seconds. You may experience an attack of vertigo. This is normal. Hold this position until the vertigo stops. °11. Then turn your head to the opposite direction until your unaffected ear is facing the floor.   °12. Hold this position for 30 seconds. You may experience an attack of vertigo. This is normal. Hold this position until the vertigo stops. °13. Now turn your whole body to the same side as your head. Hold for another 30 seconds.   °14. You can then sit back up. °ARE THERE RISKS TO THIS MANEUVER? °In some cases, you may have other symptoms (such as changes in your vision, weakness, or numbness). If you have these symptoms, stop doing the maneuver and call your health care provider. Even if doing these maneuvers relieves your vertigo, you may still have dizziness. Dizziness is the sensation of light-headedness but without the sensation of movement. Even though the Epley maneuver may relieve your vertigo, it is possible that your symptoms will return within 5 years. °WHAT SHOULD I DO AFTER THIS MANEUVER? °After doing the Epley maneuver, you can return to your normal activities. Ask your doctor if there is anything you should do at home to prevent vertigo. This may include: °· Sleeping with two or more pillows to keep your head elevated. °· Not sleeping on the side of your affected ear. °· Getting up slowly from bed. °· Avoiding sudden movements during the day. °· Avoiding extreme head movement, like looking up or bending over. °· Wearing a cervical collar to prevent sudden head movements. °WHAT SHOULD I DO IF MY SYMPTOMS GET WORSE? °Call your health care provider if your  vertigo gets worse. Call your provider right way if you have other symptoms, including:  °· Nausea. °· Vomiting. °· Headache. °· Weakness. °· Numbness. °· Vision changes. °Document Released: 10/27/2013 Document Reviewed: 10/27/2013 °ExitCare® Patient Information ©2015 [employer]. This information is not intended to replace advice given to you by your health care provider. Make sure you discuss any questions you have with your health care provider. ° °

## 2015-03-23 NOTE — ED Notes (Signed)
Pt presents with c/o dizziness that started this morning. Pt reports she is also feeling some nausea. Pt reports she was told to have an MRI because of some headaches she was experiencing, unsure if all of this is related. Pt reports her neck is also very stiff.

## 2015-03-23 NOTE — ED Provider Notes (Signed)
CSN: [id number]     Arrival date & time 03/23/15  [id number] History   First MD Initiated Contact with Patient 03/23/15 0901     Chief Complaint  Patient presents with  . Dizziness     (Consider location/radiation/quality/duration/timing/severity/associated sxs/prior Treatment) Patient is a 29 y.o. female presenting with dizziness.  Dizziness Quality:  Room spinning Severity:  Moderate Onset quality:  Gradual Timing:  Constant Progression:  Unchanged Chronicity:  Recurrent Context comment:  Spontaneous, seen by neurology for occipital ha Relieved by:  Nothing Worsened by:  Standing up and turning head Ineffective treatments:  None tried Associated symptoms: nausea   Associated symptoms: no blood in stool, no chest pain, no shortness of breath, no syncope, no tinnitus, no vision changes, no vomiting and no weakness     Past Medical History  Diagnosis Date  . Genital warts   . Normal pregnancy 06/12/2011   Past Surgical History  Procedure Laterality Date  . No past surgeries     Family History  Problem Relation Age of Onset  . Diabetes Maternal Grandmother   . Hypertension Maternal Grandmother    History  Substance Use Topics  . Smoking status: Never Smoker   . Smokeless tobacco: Not on file  . Alcohol Use: 0.0 oz/week    0 Standard drinks or equivalent per week     Comment: social    OB History    Gravida Para Term Preterm AB TAB SAB Ectopic Multiple Living   2 1 1  0 1 0 1 0 0 1     Review of Systems  HENT: Negative for tinnitus.   Respiratory: Negative for shortness of breath.   Cardiovascular: Negative for chest pain and syncope.  Gastrointestinal: Positive for nausea. Negative for vomiting and blood in stool.  Neurological: Positive for dizziness. Negative for weakness.  All other systems reviewed and are negative.     Allergies  Doxapap-n and Doxycycline  Home Medications   Prior to Admission medications   Medication Sig Start Date End Date Taking?  Authorizing Provider  acetaminophen (TYLENOL) 500 MG tablet Take 500 mg by mouth every 6 (six) hours as needed. For pain     Historical Provider, MD  benzonatate (TESSALON) 100 MG capsule  09/17/14   Historical Provider, MD  calcium carbonate (TUMS - DOSED IN MG ELEMENTAL CALCIUM) 500 MG chewable tablet Chew 1 tablet by mouth daily as needed. For heartburn    Historical Provider, MD  calcium carbonate (TUMS - DOSED IN MG ELEMENTAL CALCIUM) 500 MG chewable tablet Chew 1 tablet by mouth daily as needed. For heartburn     Historical Provider, MD  CHERATUSSIN AC 100-10 MG/5ML syrup  09/17/14   Historical Provider, MD  HYDROcodone-acetaminophen (NORCO/VICODIN) 5-325 MG per tablet Take 1 tablet by mouth every 8 (eight) hours as needed for pain. Patient not taking: Reported on 11/24/2014 06/01/13   Gwenlyn FoundJessica C Copland, MD  ondansetron (ZOFRAN) 4 MG tablet Take 4 mg by mouth 3 (three) times daily as needed. 09/17/14   Historical Provider, MD  prenatal vitamin w/FE, FA (PRENATAL 1 + 1) 27-1 MG TABS Take 1 tablet by mouth daily.     Historical Provider, MD  prenatal vitamin w/FE, FA (PRENATAL 1 + 1) 27-1 MG TABS Take 1 tablet by mouth daily.      Historical Provider, MD  prenatal vitamin w/FE, FA (PRENATAL 1 + 1) 27-1 MG TABS Take 1 tablet by mouth daily. Patient not taking: Reported on 11/24/2014 06/14/11   Jody Bovard-Stuckert,  MD  SERTRALINE HCL PO Take by mouth.    Historical Provider, MD   BP 142/94 mmHg  Pulse 94  Temp(Src) 98.8 F (37.1 C) (Oral)  Resp 18  SpO2 98%  LMP 03/08/2015 (Approximate) Physical Exam  Constitutional: She is oriented to person, place, and time. She appears well-developed and well-nourished.  HENT:  Head: Normocephalic and atraumatic.  Right Ear: External ear normal.  Left Ear: External ear normal.  Eyes: Conjunctivae and EOM are normal. Pupils are equal, round, and reactive to light.  Neck: Normal range of motion. Neck supple.  Cardiovascular: Normal rate, regular rhythm,  normal heart sounds and intact distal pulses.   Pulmonary/Chest: Effort normal and breath sounds normal.  Abdominal: Soft. Bowel sounds are normal. There is no tenderness.  Musculoskeletal: Normal range of motion.  Neurological: She is alert and oriented to person, place, and time.  Skin: Skin is warm and dry.  Vitals reviewed.   ED Course  Procedures (including critical care time) Labs Review Labs Reviewed - No data to display  Imaging Review No results found.   EKG Interpretation   Date/Time:  Wednesday Mar 23 2015 08:45:06 EDT Ventricular Rate:  92 PR Interval:  152 QRS Duration: 92 QT Interval:  368 QTC Calculation: 455 R Axis:   53 Text Interpretation:  Sinus rhythm Baseline wander in lead(s) III aVL aVF  V1 No old tracing to compare Confirmed by Mirian MoGentry, Matthew [phone]([id number]) on  03/23/2015 9:03:53 AM      MDM   Final diagnoses:  None    29 y.o. female with pertinent PMH of ha, seen by neurology for same with suspected occipital neuralgia presents with neck pain, dizziness as above.  On arrival pt with vitals as above.  Exam with HINTS suggestive of L peripheral etiology.  Meclizine provided mild relief.  Pt not ataxic.  Obtained labs and CTA of head and neck due to concurrent neck pain to ro vert or carotid dissection.  This was unremarkable.  Doubt posterior fossa pathology given lack of deficits, exam.  DC home with strict return precautions to fu with neurology.    I have reviewed all laboratory and imaging studies if ordered as above  1. Dizziness   2. Dizziness        Mirian MoMatthew Gentry, MD 03/24/15 [phone]

## 2015-03-23 NOTE — ED Notes (Signed)
Martha LundJanet L collected blood at bedside and sent to lab

## 2015-11-06 NOTE — L&D Delivery Note (Signed)
Delivery Note Pt complete with pressure, in 2 pushes had delivery.  At 10:32 AM a viable and healthy female was delivered via  (Presentation: OA, ROT  ).  APGAR: 8,9 ; weight  .   Placenta status: delivered, intact.  Cord:  3 V with the following complications: none.    Anesthesia:  epidural Episiotomy:  none Lacerations:  1st degree, R labial Suture Repair: 3.0 vicryl rapide Est. Blood Loss (mL):  200cc  Mom to postpartum.  Baby to Couplet care / Skin to Skin.  Coleman, Martha Gerstenberger 10/12/2016, 10:53 AM  Br/A-/RI/Tdap in PNC/Contra?

## 2016-09-07 DIAGNOSIS — Z22330 Carrier of Group B streptococcus: Secondary | ICD-10-CM | POA: Insufficient documentation

## 2016-10-12 ENCOUNTER — Inpatient Hospital Stay (HOSPITAL_COMMUNITY): Payer: 59 | Admitting: Anesthesiology

## 2016-10-12 ENCOUNTER — Inpatient Hospital Stay (HOSPITAL_COMMUNITY)
Admission: AD | Admit: 2016-10-12 | Discharge: 2016-10-14 | DRG: 775 | Disposition: A | Discharge status: Home / Self Care | Payer: 59 | Source: Ambulatory Visit | Attending: Obstetrics and Gynecology | Admitting: Obstetrics and Gynecology

## 2016-10-12 ENCOUNTER — Encounter (HOSPITAL_COMMUNITY): Payer: Self-pay | Admitting: Certified Nurse Midwife

## 2016-10-12 DIAGNOSIS — Z8249 Family history of ischemic heart disease and other diseases of the circulatory system: Secondary | ICD-10-CM | POA: Diagnosis not present

## 2016-10-12 DIAGNOSIS — E669 Obesity, unspecified: Secondary | ICD-10-CM | POA: Diagnosis present

## 2016-10-12 DIAGNOSIS — O99214 Obesity complicating childbirth: Secondary | ICD-10-CM | POA: Diagnosis present

## 2016-10-12 DIAGNOSIS — Z6832 Body mass index (BMI) 32.0-32.9, adult: Secondary | ICD-10-CM | POA: Diagnosis not present

## 2016-10-12 DIAGNOSIS — Z3A38 38 weeks gestation of pregnancy: Secondary | ICD-10-CM

## 2016-10-12 DIAGNOSIS — Z3493 Encounter for supervision of normal pregnancy, unspecified, third trimester: Secondary | ICD-10-CM | POA: Diagnosis present

## 2016-10-12 DIAGNOSIS — O99824 Streptococcus B carrier state complicating childbirth: Secondary | ICD-10-CM | POA: Diagnosis present

## 2016-10-12 DIAGNOSIS — Z833 Family history of diabetes mellitus: Secondary | ICD-10-CM

## 2016-10-12 DIAGNOSIS — Z3490 Encounter for supervision of normal pregnancy, unspecified, unspecified trimester: Secondary | ICD-10-CM

## 2016-10-12 LAB — CBC
HEMATOCRIT: 33.8 % — AB (ref 36.0–46.0)
HEMOGLOBIN: 11.6 g/dL — AB (ref 12.0–15.0)
MCH: 27 pg (ref 26.0–34.0)
MCHC: 34.3 g/dL (ref 30.0–36.0)
MCV: 78.8 fL (ref 78.0–100.0)
Platelets: 273 10*3/uL (ref 150–400)
RBC: 4.29 MIL/uL (ref 3.87–5.11)
RDW: 13.8 % (ref 11.5–15.5)
WBC: 10.1 10*3/uL (ref 4.0–10.5)

## 2016-10-12 LAB — TYPE AND SCREEN
ABO/RH(D): A NEG
Antibody Screen: NEGATIVE

## 2016-10-12 LAB — RPR: RPR Ser Ql: NONREACTIVE

## 2016-10-12 MED ORDER — BUTORPHANOL TARTRATE 1 MG/ML IJ SOLN: 1.0000 mg | INTRAMUSCULAR | Status: DC | PRN

## 2016-10-12 MED ORDER — PRENATAL MULTIVITAMIN CH
1.0000 | ORAL_TABLET | Freq: Every day | ORAL | Status: DC
  Administered 2016-10-12 – 2016-10-13 (×2): 1 via ORAL
  Filled 2016-10-12 (×2): qty 1

## 2016-10-12 MED ORDER — EPHEDRINE 5 MG/ML INJ
10.0000 mg | INTRAVENOUS | Status: DC | PRN
  Filled 2016-10-12: qty 4

## 2016-10-12 MED ORDER — ONDANSETRON HCL 4 MG PO TABS: 4.0000 mg | ORAL_TABLET | ORAL | Status: DC | PRN

## 2016-10-12 MED ORDER — COCONUT OIL OIL: 1.0000 "application " | TOPICAL_OIL | Status: DC | PRN

## 2016-10-12 MED ORDER — SENNOSIDES-DOCUSATE SODIUM 8.6-50 MG PO TABS
2.0000 | ORAL_TABLET | ORAL | Status: DC
  Administered 2016-10-13 (×2): 2 via ORAL
  Filled 2016-10-12 (×2): qty 2

## 2016-10-12 MED ORDER — OXYTOCIN 40 UNITS IN LACTATED RINGERS INFUSION - SIMPLE MED
2.5000 [IU]/h | INTRAVENOUS | Status: DC
  Administered 2016-10-12: 2.5 [IU]/h via INTRAVENOUS

## 2016-10-12 MED ORDER — IBUPROFEN 600 MG PO TABS
600.0000 mg | ORAL_TABLET | Freq: Four times a day (QID) | ORAL | Status: DC
  Administered 2016-10-12 – 2016-10-14 (×8): 600 mg via ORAL
  Filled 2016-10-12 (×8): qty 1

## 2016-10-12 MED ORDER — ACETAMINOPHEN 325 MG PO TABS: 650.0000 mg | ORAL_TABLET | ORAL | Status: DC | PRN

## 2016-10-12 MED ORDER — FENTANYL 2.5 MCG/ML BUPIVACAINE 1/10 % EPIDURAL INFUSION (WH - ANES)
14.0000 mL/h | INTRAMUSCULAR | Status: DC | PRN
  Administered 2016-10-12 (×2): 14 mL/h via EPIDURAL
  Filled 2016-10-12: qty 100

## 2016-10-12 MED ORDER — OXYTOCIN 40 UNITS IN LACTATED RINGERS INFUSION - SIMPLE MED
1.0000 m[IU]/min | INTRAVENOUS | Status: DC
  Administered 2016-10-12: 2 m[IU]/min via INTRAVENOUS
  Filled 2016-10-12: qty 1000

## 2016-10-12 MED ORDER — WITCH HAZEL-GLYCERIN EX PADS: 1.0000 "application " | MEDICATED_PAD | CUTANEOUS | Status: DC | PRN

## 2016-10-12 MED ORDER — DIPHENHYDRAMINE HCL 25 MG PO CAPS: 25.0000 mg | ORAL_CAPSULE | Freq: Four times a day (QID) | ORAL | Status: DC | PRN

## 2016-10-12 MED ORDER — OXYCODONE-ACETAMINOPHEN 5-325 MG PO TABS
1.0000 | ORAL_TABLET | ORAL | Status: DC | PRN
Start: 2016-10-12 — End: 2016-10-12

## 2016-10-12 MED ORDER — ONDANSETRON HCL 4 MG/2ML IJ SOLN: 4.0000 mg | INTRAMUSCULAR | Status: DC | PRN

## 2016-10-12 MED ORDER — OXYCODONE HCL 5 MG PO TABS
5.0000 mg | ORAL_TABLET | ORAL | Status: DC | PRN
  Administered 2016-10-13 – 2016-10-14 (×2): 5 mg via ORAL
  Filled 2016-10-12 (×2): qty 1

## 2016-10-12 MED ORDER — OXYCODONE HCL 5 MG PO TABS: 10.0000 mg | ORAL_TABLET | ORAL | Status: DC | PRN

## 2016-10-12 MED ORDER — LACTATED RINGERS IV SOLN
INTRAVENOUS | Status: DC
  Administered 2016-10-12: 03:00:00 via INTRAVENOUS

## 2016-10-12 MED ORDER — OXYCODONE-ACETAMINOPHEN 5-325 MG PO TABS: 2.0000 | ORAL_TABLET | ORAL | Status: DC | PRN

## 2016-10-12 MED ORDER — PHENYLEPHRINE 40 MCG/ML (10ML) SYRINGE FOR IV PUSH (FOR BLOOD PRESSURE SUPPORT)
80.0000 ug | PREFILLED_SYRINGE | INTRAVENOUS | Status: DC | PRN
  Filled 2016-10-12: qty 10
  Filled 2016-10-12: qty 5

## 2016-10-12 MED ORDER — FENTANYL 2.5 MCG/ML BUPIVACAINE 1/10 % EPIDURAL INFUSION (WH - ANES)
INTRAMUSCULAR | Status: AC
  Filled 2016-10-12: qty 100

## 2016-10-12 MED ORDER — ZOLPIDEM TARTRATE 5 MG PO TABS: 5.0000 mg | ORAL_TABLET | Freq: Every evening | ORAL | Status: DC | PRN

## 2016-10-12 MED ORDER — LACTATED RINGERS IV SOLN: 500.0000 mL | Freq: Once | INTRAVENOUS | Status: DC

## 2016-10-12 MED ORDER — LACTATED RINGERS IV SOLN: 500.0000 mL | INTRAVENOUS | Status: DC | PRN

## 2016-10-12 MED ORDER — PHENYLEPHRINE 40 MCG/ML (10ML) SYRINGE FOR IV PUSH (FOR BLOOD PRESSURE SUPPORT)
80.0000 ug | PREFILLED_SYRINGE | INTRAVENOUS | Status: DC | PRN
  Administered 2016-10-12 (×2): 80 ug via INTRAVENOUS
  Filled 2016-10-12: qty 5

## 2016-10-12 MED ORDER — LIDOCAINE HCL (PF) 1 % IJ SOLN
INTRAMUSCULAR | Status: DC | PRN
  Administered 2016-10-12 (×2): 4 mL via EPIDURAL

## 2016-10-12 MED ORDER — FLEET ENEMA 7-19 GM/118ML RE ENEM: 1.0000 | ENEMA | RECTAL | Status: DC | PRN

## 2016-10-12 MED ORDER — LACTATED RINGERS IV SOLN: INTRAVENOUS | Status: DC

## 2016-10-12 MED ORDER — ACETAMINOPHEN 325 MG PO TABS
650.0000 mg | ORAL_TABLET | ORAL | Status: DC | PRN
  Administered 2016-10-13 – 2016-10-14 (×3): 650 mg via ORAL
  Filled 2016-10-12 (×3): qty 2

## 2016-10-12 MED ORDER — PHENYLEPHRINE 40 MCG/ML (10ML) SYRINGE FOR IV PUSH (FOR BLOOD PRESSURE SUPPORT)
PREFILLED_SYRINGE | INTRAVENOUS | Status: AC
  Administered 2016-10-12: 80 ug via INTRAVENOUS
  Filled 2016-10-12: qty 10

## 2016-10-12 MED ORDER — DIPHENHYDRAMINE HCL 50 MG/ML IJ SOLN
12.5000 mg | INTRAMUSCULAR | Status: DC | PRN
Start: 2016-10-12 — End: 2016-10-14
  Administered 2016-10-12: 12.5 mg via INTRAVENOUS
  Filled 2016-10-12: qty 1

## 2016-10-12 MED ORDER — TERBUTALINE SULFATE 1 MG/ML IJ SOLN
0.2500 mg | Freq: Once | INTRAMUSCULAR | Status: DC | PRN
  Filled 2016-10-12: qty 1

## 2016-10-12 MED ORDER — ONDANSETRON HCL 4 MG/2ML IJ SOLN: 4.0000 mg | Freq: Four times a day (QID) | INTRAMUSCULAR | Status: DC | PRN

## 2016-10-12 MED ORDER — SIMETHICONE 80 MG PO CHEW: 80.0000 mg | CHEWABLE_TABLET | ORAL | Status: DC | PRN

## 2016-10-12 MED ORDER — PENICILLIN G POT IN DEXTROSE 60000 UNIT/ML IV SOLN
3.0000 10*6.[IU] | INTRAVENOUS | Status: DC
  Administered 2016-10-12: 3 10*6.[IU] via INTRAVENOUS
  Filled 2016-10-12 (×2): qty 50

## 2016-10-12 MED ORDER — SOD CITRATE-CITRIC ACID 500-334 MG/5ML PO SOLN: 30.0000 mL | ORAL | Status: DC | PRN

## 2016-10-12 MED ORDER — LIDOCAINE HCL (PF) 1 % IJ SOLN
30.0000 mL | INTRAMUSCULAR | Status: DC | PRN
  Filled 2016-10-12: qty 30

## 2016-10-12 MED ORDER — DIBUCAINE 1 % RE OINT
1.0000 | TOPICAL_OINTMENT | RECTAL | Status: DC | PRN
Start: 2016-10-12 — End: 2016-10-14
  Filled 2016-10-12: qty 28

## 2016-10-12 MED ORDER — BENZOCAINE-MENTHOL 20-0.5 % EX AERO
1.0000 "application " | INHALATION_SPRAY | CUTANEOUS | Status: DC | PRN
  Administered 2016-10-13: 1 via TOPICAL
  Filled 2016-10-12: qty 56

## 2016-10-12 MED ORDER — OXYTOCIN BOLUS FROM INFUSION: 500.0000 mL | Freq: Once | INTRAVENOUS | Status: DC

## 2016-10-12 MED ORDER — PENICILLIN G POTASSIUM 5000000 UNITS IJ SOLR
5.0000 10*6.[IU] | Freq: Once | INTRAVENOUS | Status: AC
  Administered 2016-10-12: 5 10*6.[IU] via INTRAVENOUS
  Filled 2016-10-12: qty 5

## 2016-10-12 NOTE — Anesthesia Pain Management Evaluation Note (Signed)
  CRNA Pain Management Visit Note  Patient: Martha Coleman, 30 y.o., female  "Hello I am a member of the anesthesia team at [facility]. We have an anesthesia team available at all times to provide care throughout the hospital, including epidural management and anesthesia for C-section. I don't know your plan for the delivery whether it a natural birth, water birth, IV sedation, nitrous supplementation, doula or epidural, but we want to meet your pain goals."   1.Was your pain managed to your expectations on prior hospitalizations?   Yes   2.What is your expectation for pain management during this hospitalization?     Epidural  3.How can we help you reach that goal? Epidural in place at time of visit  Record the patient's initial score and the patient's pain goal.   Pain: 0  Pain Goal: 5 [facility] wants you to be able to say your pain was always managed very well.  Martha Coleman,Martha Coleman 10/12/2016

## 2016-10-12 NOTE — Anesthesia Preprocedure Evaluation (Signed)
Anesthesia Evaluation  Patient identified by MRN, date of birth, ID band Patient awake    Reviewed: Allergy & Precautions, Patient's Chart, lab work & pertinent test results  Airway Mallampati: II  TM Distance: >3 FB Neck ROM: Full    Dental no notable dental hx. (+) Teeth Intact   Pulmonary neg pulmonary ROS,    Pulmonary exam normal breath sounds clear to auscultation       Cardiovascular negative cardio ROS Normal cardiovascular exam Rhythm:Regular Rate:Normal     Neuro/Psych negative neurological ROS  negative psych ROS   GI/Hepatic   Endo/Other  Obesity  Renal/GU   negative genitourinary   Musculoskeletal negative musculoskeletal ROS (+)   Abdominal (+) + obese,   Peds  Hematology  (+) anemia ,   Anesthesia Other Findings   Reproductive/Obstetrics HPV                             Lab Results  Component Value Date   WBC 10.1 10/12/2016   HGB 11.6 (L) 10/12/2016   HCT 33.8 (L) 10/12/2016   MCV 78.8 10/12/2016   PLT 273 10/12/2016    Anesthesia Physical Anesthesia Plan  ASA: II  Anesthesia Plan: Epidural   Post-op Pain Management:    Induction:   Airway Management Planned: Natural Airway  Additional Equipment:   Intra-op Plan:   Post-operative Plan:   Informed Consent: I have reviewed the patients History and Physical, chart, labs and discussed the procedure including the risks, benefits and alternatives for the proposed anesthesia with the patient or authorized representative who has indicated his/her understanding and acceptance.     Plan Discussed with: Anesthesiologist  Anesthesia Plan Comments:         Anesthesia Quick Evaluation

## 2016-10-12 NOTE — MAU Note (Signed)
Patient present to MAU with complaints of contractions. Patient states contractions are 3-5 minutes apart that began around 2000. Patient rates the contractions and lower back pain 7/10. Denies bleeding and leaking of fluid.

## 2016-10-12 NOTE — Lactation Note (Signed)
This note was copied from a baby's chart. Lactation Consultation Note  Patient Name: Girl Electa SniffBrittany Yazdani ZOXWR'UToday's Date: 10/12/2016 Reason for consult: Follow-up assessment Baby at 8 hr of life. Upon entry baby was sts with mom. Mom stated baby is latching more comfortably with the NS. She has DEBP and Harmony in the room. She reports knowing how to manually express and has spoon in the room. Discussed baby behavior, feeding frequency, baby belly size, voids, wt loss, breast changes, and nipple care. She is aware of lactation services and support group. She will call as needed. She will offer the breast on demand 8+/24hr and post pump after using the NS.    Maternal Data    Feeding Feeding Type: Breast Fed Length of feed: 30 min  LATCH Score/Interventions Latch: Repeated attempts needed to sustain latch, nipple held in mouth throughout feeding, stimulation needed to elicit sucking reflex. Intervention(s): Assist with latch  Audible Swallowing: A few with stimulation Intervention(s): Skin to skin;Hand expression  Type of Nipple: Flat Intervention(s): Double electric pump  Comfort (Breast/Nipple): Soft / non-tender     Hold (Positioning): Assistance needed to correctly position infant at breast and maintain latch. Intervention(s): Support Pillows;Position options  LATCH Score: 6  Lactation Tools Discussed/Used Nipple shield size: 20 (20)   Consult Status Consult Status: Follow-up Date: 10/13/16 Follow-up type: In-patient    Rulon Eisenmengerlizabeth E Lauriel Helin 10/12/2016, 7:02 PM

## 2016-10-12 NOTE — Progress Notes (Signed)
Has epidural, some discomfort Afeb, VSS FHT- 130-140, mod variability, + accels, some early decels, ctx q 3-4 min, Cat I VE-5-6/70/-2, vtx, AROM clear Protracted labor, will see how she does with AROM for augmentation as well as pitocin, PCN for GBS

## 2016-10-12 NOTE — Anesthesia Procedure Notes (Signed)
Epidural Patient location during procedure: OB Start time: 10/12/2016 4:00 AM  Staffing Anesthesiologist: Mal AmabileFOSTER, Providence Stivers Performed: anesthesiologist   Preanesthetic Checklist Completed: patient identified, site marked, surgical consent, pre-op evaluation, timeout performed, IV checked, risks and benefits discussed and monitors and equipment checked  Epidural Patient position: sitting Prep: site prepped and draped and DuraPrep Patient monitoring: continuous pulse ox and blood pressure Approach: midline Location: L3-L4 Injection technique: LOR air  Needle:  Needle type: Tuohy  Needle gauge: 17 G Needle length: 9 cm and 9 Needle insertion depth: 5 cm cm Catheter type: closed end flexible Catheter size: 19 Gauge Catheter at skin depth: 10 cm Test dose: negative and Other  Assessment Events: blood not aspirated, injection not painful, no injection resistance, negative IV test and no paresthesia  Additional Notes Patient identified. Risks and benefits discussed including failed block, incomplete  Pain control, post dural puncture headache, nerve damage, paralysis, blood pressure Changes, nausea, vomiting, reactions to medications-both toxic and allergic and post Partum back pain. All questions were answered. Patient expressed understanding and wished to proceed. Sterile technique was used throughout procedure. Epidural site was Dressed with sterile barrier dressing. No paresthesias, signs of intravascular injection Or signs of intrathecal spread were encountered.  Patient was more comfortable after the epidural was dosed. Please see RN's note for documentation of vital signs and FHR which are stable.

## 2016-10-12 NOTE — H&P (Addendum)
Martha RakersBrittany L Coleman is a 30 y.o. female  G3P1011 at 38+ with ctx, increasing in intensity and frequency.  Cervical change from exam in office.  Relatively uncomplicated ZPNC.  Received Tdap, Rhogam and Flu.    OB History    Gravida Para Term Preterm AB Living   3 1 1  0 1 1   SAB TAB Ectopic Multiple Live Births   1 0 0 0 1    G1 SAB G2 SVD female, Martha Coleman, 7#12 G3 present  No abn pap, last 2017 WNL No STD  Past Medical History:  Diagnosis Date  . Genital warts   . Normal pregnancy 06/12/2011  h/o depression/anxiety  Past Surgical History:  Procedure Laterality Date  . Hysteroscopy - polyp removal     Family History: family history includes Diabetes in her maternal grandmother; Hypertension in her maternal grandmother. mo coag d/o  Social History:  reports that she has never smoked. She has quit using smokeless tobacco. She reports that she drinks alcohol. She reports that she does not use drugs. married [employer] [facility]  Meds PNV All doxycycline, latex     Maternal Diabetes: No Genetic Screening: Normal Maternal Ultrasounds/Referrals: Normal Fetal Ultrasounds or other Referrals:  None Maternal Substance Abuse:  No Significant Maternal Medications:  None Significant Maternal Lab Results:  Lab values include: Group B Strep positive Other Comments:  None  Review of Systems  Constitutional: Negative.   HENT: Negative.   Eyes: Negative.   Respiratory: Negative.   Cardiovascular: Negative.   Gastrointestinal: Negative.   Genitourinary: Negative.   Musculoskeletal: Negative.   Skin: Negative.   Neurological: Negative.   Psychiatric/Behavioral: Negative.    Maternal Medical History:  Reason for admission: Contractions.   Contractions: Frequency: regular.   Perceived severity is strong.    Fetal activity: Perceived fetal activity is normal.    Prenatal Complications - Diabetes: none.    Dilation: 5.5 Effacement (%): 70 Station: -1 Exam by:: S.dixon RN  Blood  pressure 116/90, pulse (!) 104, temperature 98.7 F (37.1 C), temperature source Oral, resp. rate 16, height 5\' 4"  (1.626 m), weight 84.4 kg (186 lb), last menstrual period 01/10/2016, SpO2 100 %. Maternal Exam:  Uterine Assessment: Contraction frequency is regular.   Abdomen: Patient reports no abdominal tenderness. Fundal height is appropriate for gestation.   Estimated fetal weight is 7.8-8.5#.   Fetal presentation: vertex  Introitus: Normal vulva. Normal vagina.    Physical Exam  Constitutional: She is oriented to person, place, and time. She appears well-developed and well-nourished.  HENT:  Head: Normocephalic and atraumatic.  Cardiovascular: Normal rate and regular rhythm.   Respiratory: Effort normal and breath sounds normal. No respiratory distress. She has no wheezes.  GI: Soft. Bowel sounds are normal. She exhibits no distension. There is no tenderness.  Musculoskeletal: Normal range of motion.  Neurological: She is alert and oriented to person, place, and time.  Skin: Skin is warm and dry.  Psychiatric: She has a normal mood and affect. Her behavior is normal.     FHTs 130's, mod var, category 1 toco q 4min  Prenatal labs: ABO, Rh: --/--/A NEG (12/08 0255) Antibody: NEG (12/08 0255) Rubella:  immune RPR:   NR HBsAg:   neg HIV:   neg GBS:   positive in urine  Hgb 13.9/Plt 303/Ur Cx +/GC neg/ Chl neg/Pap WNL/ First trimester Scr WNL/glucol 96/essential panel WNL/  Dated by LMP cw First trimester US  Nl NT  Nl anat post plac, female  Assessment/Plan:  30yo G3P1011 at 38+ in labor Epidural for pain PCN for GBBS Pitocin and AROM to augment Expect SVD  Bovard-Stuckert, Martha Coleman 10/12/2016, 4:49 AM

## 2016-10-12 NOTE — Lactation Note (Signed)
This note was copied from a baby's chart. Lactation Consultation Note  Patient Name: Martha Coleman UJWJX'BToday's Date: 10/12/2016 Reason for consult: Follow-up assessment;Difficult latch;Breast/nipple pain Mom was not successful BF her 30 year old, DL and nipple pain. This baby very eager at breast. Mom has flat nipples but with breast compression baby latched demonstrating few good suckling bursts. Baby has difficulty keeping lower lip un-tucked and because of this Mom developed small bruise on left nipple with baby nursing. Assisted with latch on right breast but same experience. Mom wanted to try nipple shield to see if more comfortable with baby at breast. Applied 20 nipple shield and Mom did not have discomfort. Baby tongue thrusting off/on throughout the feeding. Lower lip will not stay un-tucked so this needed to be adjusted off/on throughout the feeding.  Supporting breast well helped with this. Scant amount of colostrum visible in nipple shield with feeding. Advised Mom to BF with feeding ques, use hand pump to pre-pump to help with latch. Use nipple shield if less painful with baby at breast. Discussed post pumping later today if using nipple shield. Lactation brochure left for review, advised of OP services and support group. Mom considering pump/bottle feeding due to previous experience. Mom willing to work with BF but may change if painful or having difficulty with latch. Encouraged Mom to call for assist with feedings.   Maternal Data Has patient been taught Hand Expression?: Yes Does the patient have breastfeeding experience prior to this delivery?: Yes  Feeding Feeding Type: Breast Fed Length of feed: 20 min  LATCH Score/Interventions Latch: Grasps breast easily, tongue down, lips flanged, rhythmical sucking. (usign 20 nipple shield) Intervention(s): Adjust position;Assist with latch  Audible Swallowing: A few with stimulation  Type of Nipple: Flat Intervention(s): Hand  pump  Comfort (Breast/Nipple): Filling, red/small blisters or bruises, mild/mod discomfort  Problem noted: Mild/Moderate discomfort Interventions (Mild/moderate discomfort): Pre-pump if needed  Hold (Positioning): Assistance needed to correctly position infant at breast and maintain latch. Intervention(s): Breastfeeding basics reviewed;Support Pillows;Position options;Skin to skin  LATCH Score: 6  Lactation Tools Discussed/Used Tools: Nipple Dorris CarnesShields;Pump Nipple shield size: 20 Breast pump type: Manual WIC Program: No   Consult Status Consult Status: Follow-up Date: 10/13/16 Follow-up type: In-patient    Alfred LevinsGranger, [name] 10/12/2016, 12:10 PM

## 2016-10-12 NOTE — Anesthesia Postprocedure Evaluation (Signed)
Anesthesia Post Note  Patient: Renaye RakersBrittany L Vazquez  Procedure(s) Performed: * No procedures listed *  Patient location during evaluation: Mother Baby Anesthesia Type: Epidural Level of consciousness: awake and alert and oriented Pain management: satisfactory to patient Vital Signs Assessment: post-procedure vital signs reviewed and stable Respiratory status: spontaneous breathing and nonlabored ventilation Cardiovascular status: stable Postop Assessment: no headache, no backache, no signs of nausea or vomiting, adequate PO intake and patient able to bend at knees (patient up walking) Anesthetic complications: no     Last Vitals:  Vitals:   10/12/16 1350 10/12/16 1727  BP: 118/75 113/70  Pulse: 75 78  Resp: 18 18  Temp: 36.9 C 36.8 C    Last Pain:  Vitals:   10/12/16 1727  TempSrc: Oral  PainSc: 0-No pain   Pain Goal: Patients Stated Pain Goal: 0 (10/12/16 1230)               Madison HickmanGREGORY,Barbara Keng

## 2016-10-13 LAB — CBC
HEMATOCRIT: 28.2 % — AB (ref 36.0–46.0)
HEMOGLOBIN: 9.5 g/dL — AB (ref 12.0–15.0)
MCH: 27.2 pg (ref 26.0–34.0)
MCHC: 33.7 g/dL (ref 30.0–36.0)
MCV: 80.8 fL (ref 78.0–100.0)
Platelets: 199 10*3/uL (ref 150–400)
RBC: 3.49 MIL/uL — AB (ref 3.87–5.11)
RDW: 14.1 % (ref 11.5–15.5)
WBC: 8 10*3/uL (ref 4.0–10.5)

## 2016-10-13 MED ORDER — RHO D IMMUNE GLOBULIN 1500 UNIT/2ML IJ SOSY
300.0000 ug | PREFILLED_SYRINGE | Freq: Once | INTRAMUSCULAR | Status: AC
  Administered 2016-10-13: 300 ug via INTRAMUSCULAR
  Filled 2016-10-13: qty 2

## 2016-10-13 NOTE — Progress Notes (Signed)
MOB was referred for history of depression/anxiety.  Referral is screened out by Clinical Social Worker because none of the following criteria appear to apply and there are no reports impacting the pregnancy or her transition to the postpartum period.  CSW does not deem it clinically necessary to further investigate at this time.   -History of anxiety/depression during this pregnancy, or of post-partum depression.  - Diagnosis of anxiety and/or depression within last 3 years.-  - History of depression due to pregnancy loss/loss of child or -MOB's symptoms are currently being treated with medication and/or therapy.  Please contact the Clinical Social Worker if needs arise or upon MOB request.    Cecilie Heidel, MSW, LCSW-A Clinical Social Worker  [facility]  Office: [phone]   

## 2016-10-13 NOTE — Progress Notes (Signed)
PPD #1 No problems Afeb, VSS Fundus firm, NT at U-1 Continue routine postpartum care 

## 2016-10-13 NOTE — Lactation Note (Signed)
This note was copied from a baby's chart. Lactation Consultation Note  Patient Name: Girl Electa SniffBrittany Subramaniam ZOXWR'UToday's Date: 10/13/2016 Reason for consult: Follow-up assessment;Breast/nipple pain;Difficult latch   Follow up with mom at RN request. Mom had infant latched to right breast using great position and pillow support. Infant actively nursing. Mom noted constant pain when infant sucking using # 20 NS. Placed # 24 NS and primed with EBM. Infant latched well with active suckling and intermittent swallows. Mom report pain was decreased with # 24 NS. Mom is pumping and getting about 5 cc colostrum. Mom reports she is using # 24 flanges and noted that it is painful to pump, enc her to decrease suction to tolerable level and to try # 27 flanges with next pumping. Bruises noted to both nipples, skin intact. Enc mom to apply EBM to nipples post feed followed by comfort gels. Comfort gels given with instructions for use and cleaning. Mom asked how much infant should be supplemented with, enc her to feed back all EBM that is pumped. Mom voiced understanding. Mom and dad without further questions/concerns.   Maternal Data Formula Feeding for Exclusion: No Has patient been taught Hand Expression?: Yes Does the patient have breastfeeding experience prior to this delivery?: Yes  Feeding Feeding Type: Breast Fed Length of feed: 20 min  LATCH Score/Interventions Latch: Repeated attempts needed to sustain latch, nipple held in mouth throughout feeding, stimulation needed to elicit sucking reflex. Intervention(s): Adjust position;Assist with latch;Breast massage;Breast compression  Audible Swallowing: A few with stimulation Intervention(s): Alternate breast massage;Hand expression;Skin to skin  Type of Nipple: Flat (everts with stimulation) Intervention(s): Double electric pump  Comfort (Breast/Nipple): Filling, red/small blisters or bruises, mild/mod discomfort  Problem noted: Mild/Moderate  discomfort;Cracked, bleeding, blisters, bruises Interventions (Filling): Massage;Double electric pump Interventions  (Cracked/bleeding/bruising/blister): Expressed breast milk to nipple Interventions (Mild/moderate discomfort): Comfort gels  Hold (Positioning): No assistance needed to correctly position infant at breast. Intervention(s): Breastfeeding basics reviewed;Support Pillows;Position options;Skin to skin  LATCH Score: 6  Lactation Tools Discussed/Used Tools: Nipple Shields Nipple shield size: 24;20 Pump Review: Setup, frequency, and cleaning;Milk Storage   Consult Status Consult Status: Follow-up Date: 10/14/16 Follow-up type: In-patient    Silas FloodSharon S Kourtnie Sachs 10/13/2016, 8:07 PM

## 2016-10-14 LAB — RH IG WORKUP (INCLUDES ABO/RH)
ABO/RH(D): A NEG
Fetal Screen: NEGATIVE
Gestational Age(Wks): 38.3
Unit division: 0

## 2016-10-14 MED ORDER — IBUPROFEN 600 MG PO TABS: 600.0000 mg | ORAL_TABLET | Freq: Four times a day (QID) | ORAL | 0 refills | Status: DC

## 2016-10-14 MED ORDER — OXYCODONE HCL 5 MG PO TABS: 5.0000 mg | ORAL_TABLET | ORAL | 0 refills | Status: DC | PRN

## 2016-10-14 NOTE — Discharge Instructions (Signed)
As per discharge pamphlet °

## 2016-10-14 NOTE — Progress Notes (Signed)
PPD #2 Doing well Afeb, VSS D/c home 

## 2016-10-14 NOTE — Lactation Note (Signed)
This note was copied from a baby's chart. Lactation Consultation Note  Patient Name: Martha Electa SniffBrittany Sandquist IONGE'XToday's Date: 10/14/2016 Reason for consult: Infant weight loss;Other (Comment) (7 % weight loss ) Baby is 48 hours old, and per mom the baby cluster fed last night. The MBU  RN  Increased the NS to #24 and it felt better and the baby is latching well , also used the curved tip syringe to  Put some formula in the top. The baby seemed more satisfied.  Per mom breast are fuller and seeing milk in the NS after baby releases. Sore nipple and engorgement  Prevention and tx reviewed. Per mom has a DEBP Spectra at home. LC reminded mom of the Spectra sent  Up and the membranes, per mom increased her flanges during the night to #27 's and they were more comfortable.  LC mentioned to mom the Spectra has 25's and 28's and she may want to try the #25's 1st and check for comfort.  [facility] dad [facility] Thursday Dec.14 th at 10:30 am , appt, reminder given to mom. And extra diary sheets.  LC also recommended feeding 1st breast 20 mins , supplemented with EBM and post pump 10 - 15 mins. Next feeding  Switch to the other breast to feed for 20 mins , supplement , post pump due to the  7% weight loss and also due to mom  Having to multi - task due to using the NS.  Mother informed of post-discharge support and given phone number to the lactation department, including services for phone call assistance; out-patient appointments; and breastfeeding support group. List of other breastfeeding resources in the community given in the handout. Encouraged mother to call for problems or concerns related to breastfeeding.  Maternal Data    Feeding Feeding Type:  (baby last fed at 0910 ) Length of feed: 40 min  LATCH Score/Interventions Latch: Grasps breast easily, tongue down, lips flanged, rhythmical sucking.  Audible Swallowing: A few with stimulation  Type of Nipple:  Flat  Comfort (Breast/Nipple): Filling, red/small blisters or bruises, mild/mod discomfort     Hold (Positioning): No assistance needed to correctly position infant at breast. Intervention(s): Breastfeeding basics reviewed  LATCH Score: 7  Lactation Tools Discussed/Used Tools: Pump Nipple shield size: 20;24;Other (comment) (per mom the #24 NS feels more comfortable ) Breast pump type: Double-Electric Breast Pump   Consult Status Consult Status: Follow-up Date: 10/18/16 [facility] O/P appt. at 10:30 am , appt. reminder given ) Follow-up type: Out-patient    Martha Coleman 10/14/2016, 10:59 AM

## 2016-10-14 NOTE — Discharge Summary (Signed)
OB Discharge Summary     Patient Name: Martha Coleman DOB: 05/20/1986 MRN: [id number]  Date of admission: 10/12/2016 Delivering MD: Sherian ReinBOVARD-STUCKERT, JODY   Date of discharge: 10/14/2016  Admitting diagnosis: 37wks contraction 3-575mins back pain  Intrauterine pregnancy: 1145w3d     Secondary diagnosis:  Principal Problem:   SVD (spontaneous vaginal delivery) Active Problems:   Pregnancy      Discharge diagnosis: Term Pregnancy Delivered                                                                                                 Hospital course:  Onset of Labor With Vaginal Delivery     30 y.o. yo [id number] at 5745w3d was admitted in Active Labor on 10/12/2016. Patient had an uncomplicated labor course as follows:  Membrane Rupture Time/Date: 8:15 AM ,10/12/2016   Intrapartum Procedures: Episiotomy: None [1]                                         Lacerations:  1st degree [2];Labial [10]  Patient had a delivery of a Viable infant. 10/12/2016  Information for the patient's newborn:  Martha Coleman, Martha Coleman [[id number]][[id number]]  Delivery Method: Vaginal, Spontaneous Delivery (Filed from Delivery Summary)    Pateint had an uncomplicated postpartum course.  She is ambulating, tolerating a regular diet, passing flatus, and urinating well. Patient is discharged home in stable condition on 10/14/16.    Physical exam Vitals:   10/12/16 2053 10/13/16 0533 10/13/16 1845 10/14/16 0600  BP: 132/87 107/69 136/86 132/72  Pulse: 82 63 74 68  Resp: 18 18 18 18   Temp: 98.1 F (36.7 C) 97.6 F (36.4 C) 97.7 F (36.5 C) 98.2 F (36.8 C)  TempSrc: Oral Oral Oral Oral  SpO2:      Weight:      Height:       General: alert Lochia: appropriate Uterine Fundus: firm  Labs: Lab Results  Component Value Date   WBC 8.0 10/13/2016   HGB 9.5 (L) 10/13/2016   HCT 28.2 (L) 10/13/2016   MCV 80.8 10/13/2016   PLT 199 10/13/2016   CMP Latest Ref Rng & Units 03/23/2015  Glucose 65 - 99 mg/dL 94  BUN 6 - 20  mg/dL 10  Creatinine 1.300.44 - 8.651.00 mg/dL 7.840.63  Sodium 696135 - 295145 mmol/L 141  Potassium 3.5 - 5.1 mmol/L 4.0  Chloride 101 - 111 mmol/L 106  CO2 22 - 32 mmol/L 28  Calcium 8.9 - 10.3 mg/dL 9.4    Discharge instruction: per After Visit Summary and "Baby and Me Booklet".  After visit meds:    Medication List    TAKE these medications   acetaminophen 500 MG tablet Commonly known as:  TYLENOL Take 1,000 mg by mouth every 6 (six) hours as needed for moderate pain.   docusate sodium 100 MG capsule Commonly known as:  COLACE Take 100 mg by mouth daily.   ibuprofen 600 MG tablet Commonly known as:  ADVIL,MOTRIN Take 1 tablet (600  mg total) by mouth every 6 (six) hours.   oxyCODONE 5 MG immediate release tablet Commonly known as:  Oxy IR/ROXICODONE Take 1 tablet (5 mg total) by mouth every 4 (four) hours as needed for severe pain.       Diet: routine diet  Activity: Advance as tolerated. Pelvic rest for 6 weeks.   Outpatient follow up:4 weeks   Newborn Data: Live born female  Birth Weight: 6 lb 15.5 oz (3161 g) APGAR: 8, 9  Baby Feeding: Breast Disposition:home with mother   10/14/2016 Zenaida NieceMEISINGER,Lajune Perine D, MD

## 2016-10-16 ENCOUNTER — Inpatient Hospital Stay (HOSPITAL_COMMUNITY): Admission: RE | Admit: 2016-10-16 | Payer: MEDICAID | Source: Ambulatory Visit

## 2016-10-18 ENCOUNTER — Ambulatory Visit (HOSPITAL_COMMUNITY): Payer: 59

## 2016-12-29 DIAGNOSIS — J00 Acute nasopharyngitis [common cold]: Secondary | ICD-10-CM | POA: Diagnosis not present

## 2017-03-18 DIAGNOSIS — R3915 Urgency of urination: Secondary | ICD-10-CM | POA: Diagnosis not present

## 2017-03-18 DIAGNOSIS — R350 Frequency of micturition: Secondary | ICD-10-CM | POA: Diagnosis not present

## 2017-03-21 DIAGNOSIS — R319 Hematuria, unspecified: Secondary | ICD-10-CM | POA: Diagnosis not present

## 2017-03-26 DIAGNOSIS — Z01419 Encounter for gynecological examination (general) (routine) without abnormal findings: Secondary | ICD-10-CM | POA: Diagnosis not present

## 2017-07-25 DIAGNOSIS — Z23 Encounter for immunization: Secondary | ICD-10-CM | POA: Diagnosis not present

## 2017-09-13 DIAGNOSIS — R102 Pelvic and perineal pain: Secondary | ICD-10-CM | POA: Diagnosis not present

## 2017-09-13 DIAGNOSIS — R51 Headache: Secondary | ICD-10-CM | POA: Diagnosis not present

## 2017-12-06 ENCOUNTER — Ambulatory Visit: Payer: 59 | Admitting: Family Medicine

## 2017-12-06 ENCOUNTER — Encounter: Payer: Self-pay | Admitting: Family Medicine

## 2017-12-06 VITALS — BP 134/82 | HR 74 | Temp 98.5°F | Ht 63.25 in | Wt 156.0 lb

## 2017-12-06 DIAGNOSIS — E663 Overweight: Secondary | ICD-10-CM | POA: Insufficient documentation

## 2017-12-06 DIAGNOSIS — Z6827 Body mass index (BMI) 27.0-27.9, adult: Secondary | ICD-10-CM | POA: Diagnosis not present

## 2017-12-06 DIAGNOSIS — Z7689 Persons encountering health services in other specified circumstances: Secondary | ICD-10-CM

## 2017-12-06 DIAGNOSIS — N915 Oligomenorrhea, unspecified: Secondary | ICD-10-CM | POA: Insufficient documentation

## 2017-12-06 DIAGNOSIS — N762 Acute vulvitis: Secondary | ICD-10-CM | POA: Insufficient documentation

## 2017-12-06 DIAGNOSIS — N926 Irregular menstruation, unspecified: Secondary | ICD-10-CM | POA: Insufficient documentation

## 2017-12-06 DIAGNOSIS — R87619 Unspecified abnormal cytological findings in specimens from cervix uteri: Secondary | ICD-10-CM | POA: Insufficient documentation

## 2017-12-06 NOTE — Progress Notes (Signed)
   Subjective:    Patient ID: Renaye RakersBrittany L Smyth, female    DOB: 10/09/1986, 32 y.o.   MRN: [id number]  HPI This is a 32 yo female who presents today to establish care. Husband also establishing care today. 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  Enjoys reading.   No concerns today  Last CPE- annual with gyn (Bovard) Pap- up to date per pt Tdap- 07/27/16 Flu- annaul Eye-regular Dental-regular Exercise- not regular, active at work and home. Sleep- 5-6 hours a night, has trouble winding down some nights, once asleep usually stays asleep.    Contraception- Using OCPs, had problem with IUD after second baby born, had to be removed.     Past Medical History:  Diagnosis Date  . Genital warts   . Normal pregnancy 06/12/2011  . SVD (spontaneous vaginal delivery) 10/12/2016   Past Surgical History:  Procedure Laterality Date  . HYSTEROSCOPY    . NO PAST SURGERIES    . TONSILLECTOMY AND ADENOIDECTOMY    . WISDOM TOOTH EXTRACTION     Family History  Problem Relation Age of Onset  . Clotting disorder Mother   . Diabetes Maternal Grandmother   . Hypertension Maternal Grandmother    Social History   Tobacco Use  . Smoking status: Former Games developermoker  . Smokeless tobacco: Never Used  Substance Use Topics  . Alcohol use: Yes    Alcohol/week: 0.0 oz    Comment: social   . Drug use: No      Review of Systems  Constitutional: Negative for fatigue, fever and unexpected weight change.  Respiratory: Negative for cough and shortness of breath.   Cardiovascular: Negative for chest pain, palpitations and leg swelling.  Genitourinary: Negative for dyspareunia.  Neurological: Positive for headaches (chronic, intermittent).  Psychiatric/Behavioral: Negative for dysphoric mood and sleep disturbance. The patient is not nervous/anxious.        Objective:   Physical Exam Physical Exam  Constitutional: Oriented to person, place, and time. She  appears well-developed and well-nourished.  HENT:  Head: Normocephalic and atraumatic.  Eyes: Conjunctivae are normal.  Neck: Normal range of motion. Neck supple.  Cardiovascular: Normal rate, regular rhythm and normal heart sounds.   Pulmonary/Chest: Effort normal and breath sounds normal.  Musculoskeletal: Normal range of motion.  Neurological: Alert and oriented to person, place, and time.  Skin: Skin is warm and dry.  Psychiatric: Normal mood and affect. Behavior is normal. Judgment and thought content normal.  Vitals reviewed.     BP 134/82   Pulse 74   Temp 98.5 F (36.9 C) (Oral)   Ht 5' 3.25" (1.607 m)   Wt 156 lb (70.8 kg)   SpO2 98%   BMI 27.42 kg/m      Assessment & Plan:  1. Encounter to establish care - Discussed and encouraged healthy lifestyle choices- adequate sleep, regular exercise, stress management and healthy food choices.   2. BMI 27.0-27.9,adult - encouraged increased sleep, stress management, making time for interests  - follow up in 1 year  Olean Reeeborah Gessner, FNP-BC  [facility]  12/06/2017 3:43 PM

## 2017-12-06 NOTE — Patient Instructions (Signed)
It was a pleasure to meet you today! I look forward to partnering with you for your health care needs   

## 2018-03-27 DIAGNOSIS — N644 Mastodynia: Secondary | ICD-10-CM | POA: Insufficient documentation

## 2018-04-01 ENCOUNTER — Other Ambulatory Visit: Payer: Self-pay | Admitting: Obstetrics and Gynecology

## 2018-04-01 DIAGNOSIS — N644 Mastodynia: Secondary | ICD-10-CM

## 2018-04-02 ENCOUNTER — Ambulatory Visit
Admission: RE | Admit: 2018-04-02 | Discharge: 2018-04-02 | Disposition: A | Discharge status: Home / Self Care | Payer: 59 | Source: Ambulatory Visit | Attending: Obstetrics and Gynecology | Admitting: Obstetrics and Gynecology

## 2018-04-02 ENCOUNTER — Other Ambulatory Visit: Payer: Self-pay | Admitting: Obstetrics and Gynecology

## 2018-04-02 DIAGNOSIS — N644 Mastodynia: Secondary | ICD-10-CM

## 2018-04-02 DIAGNOSIS — N6312 Unspecified lump in the right breast, upper inner quadrant: Secondary | ICD-10-CM | POA: Diagnosis not present

## 2018-04-02 DIAGNOSIS — R922 Inconclusive mammogram: Secondary | ICD-10-CM | POA: Diagnosis not present

## 2018-04-02 DIAGNOSIS — N6310 Unspecified lump in the right breast, unspecified quadrant: Secondary | ICD-10-CM

## 2018-05-09 DIAGNOSIS — Z01419 Encounter for gynecological examination (general) (routine) without abnormal findings: Secondary | ICD-10-CM | POA: Diagnosis not present

## 2018-05-09 DIAGNOSIS — Z130 Encounter for screening for diseases of the blood and blood-forming organs and certain disorders involving the immune mechanism: Secondary | ICD-10-CM | POA: Diagnosis not present

## 2018-05-26 ENCOUNTER — Encounter: Payer: Self-pay | Admitting: Family Medicine

## 2018-05-26 ENCOUNTER — Ambulatory Visit: Payer: 59 | Admitting: Family Medicine

## 2018-05-26 VITALS — BP 130/98 | HR 96 | Temp 98.6°F | Ht 63.25 in | Wt 156.8 lb

## 2018-05-26 DIAGNOSIS — R5383 Other fatigue: Secondary | ICD-10-CM | POA: Diagnosis not present

## 2018-05-26 DIAGNOSIS — R7989 Other specified abnormal findings of blood chemistry: Secondary | ICD-10-CM | POA: Diagnosis not present

## 2018-05-26 DIAGNOSIS — R21 Rash and other nonspecific skin eruption: Secondary | ICD-10-CM | POA: Diagnosis not present

## 2018-05-26 DIAGNOSIS — R002 Palpitations: Secondary | ICD-10-CM | POA: Diagnosis not present

## 2018-05-26 LAB — COMPREHENSIVE METABOLIC PANEL
ALBUMIN: 3.9 g/dL (ref 3.5–5.2)
ALK PHOS: 37 U/L — AB (ref 39–117)
ALT: 16 U/L (ref 0–35)
AST: 13 U/L (ref 0–37)
BUN: 10 mg/dL (ref 6–23)
CO2: 26 mEq/L (ref 19–32)
Calcium: 9.2 mg/dL (ref 8.4–10.5)
Chloride: 103 mEq/L (ref 96–112)
Creatinine, Ser: 0.92 mg/dL (ref 0.40–1.20)
GFR: 75.53 mL/min (ref >60.00)
Glucose, Bld: 87 mg/dL (ref 70–99)
POTASSIUM: 4.1 meq/L (ref 3.5–5.1)
Sodium: 137 mEq/L (ref 135–145)
TOTAL PROTEIN: 7.2 g/dL (ref 6.0–8.3)
Total Bilirubin: 0.5 mg/dL (ref 0.2–1.2)

## 2018-05-26 LAB — SEDIMENTATION RATE: SED RATE: 4 mm/h (ref 0–20)

## 2018-05-26 LAB — TSH: TSH: 5.68 u[IU]/mL — AB (ref 0.35–4.50)

## 2018-05-26 LAB — CBC WITH DIFFERENTIAL/PLATELET
Basophils Absolute: 0 10*3/uL (ref 0.0–0.1)
Basophils Relative: 0.3 % (ref 0.0–3.0)
Eosinophils Absolute: 0.2 10*3/uL (ref 0.0–0.7)
Eosinophils Relative: 2.6 % (ref 0.0–5.0)
HEMATOCRIT: 43.5 % (ref 36.0–46.0)
HEMOGLOBIN: 15.2 g/dL — AB (ref 12.0–15.0)
Lymphocytes Relative: 21.7 % (ref 12.0–46.0)
Lymphs Abs: 1.6 10*3/uL (ref 0.7–4.0)
MCHC: 34.9 g/dL (ref 30.0–36.0)
MCV: 90 fl (ref 78.0–100.0)
MONO ABS: 0.3 10*3/uL (ref 0.1–1.0)
Monocytes Relative: 3.6 % (ref 3.0–12.0)
Neutro Abs: 5.2 10*3/uL (ref 1.4–7.7)
Neutrophils Relative %: 71.8 % (ref 43.0–77.0)
Platelets: 282 10*3/uL (ref 150.0–400.0)
RBC: 4.83 Mil/uL (ref 3.87–5.11)
RDW: 12.1 % (ref 11.5–15.5)
WBC: 7.3 10*3/uL (ref 4.0–10.5)

## 2018-05-26 MED ORDER — BETAMETHASONE DIPROPIONATE AUG 0.05 % EX CREA: TOPICAL_CREAM | CUTANEOUS | 1 refills | Status: DC

## 2018-05-26 MED ORDER — CLINDAMYCIN PHOSPHATE 1 % EX GEL: Freq: Two times a day (BID) | CUTANEOUS | 0 refills | Status: DC

## 2018-05-26 NOTE — Patient Instructions (Signed)
Good to see you today  I'll get in touch when I get your labs back

## 2018-05-26 NOTE — Progress Notes (Signed)
Subjective:    Patient ID: Martha Coleman, female    DOB: 10/01/1986, 32 y.o.   MRN: [id number]  HPI This is a 32 yo female who presents today with rash on hands for 2 months. 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 She has gotten conflicting diagnoses. Intermittent lesions on hands get raised and red with bumps under the skin. Unknown trigger, has been using betamethasone with some improvement.   Had tick bite prior to rash on hands starting. Tick was small, not sure if it was engorged, unsure how long it was there. Took a partial course of Ceftin but stopped because topical clindamycin was helping.   Had a lesion appear on left foot last week that was painful and itchy. Was prescribed topical clindamycin with improvement. Now with spot on right foot. Had one episode of vomiting while at beach. Had negative pregnancy test.   Having some intermittent palpitations and just not feeling well, no fevers.   Past Medical History:  Diagnosis Date  . Genital warts   . Normal pregnancy 06/12/2011  . SVD (spontaneous vaginal delivery) 10/12/2016   Past Surgical History:  Procedure Laterality Date  . HYSTEROSCOPY    . NO PAST SURGERIES    . TONSILLECTOMY AND ADENOIDECTOMY    . WISDOM TOOTH EXTRACTION     Family History  Problem Relation Age of Onset  . Clotting disorder Mother   . Diabetes Maternal Grandmother   . Hypertension Maternal Grandmother    Social History   Tobacco Use  . Smoking status: Former Games developermoker  . Smokeless tobacco: Never Used  Substance Use Topics  . Alcohol use: Yes    Alcohol/week: 0.0 oz    Comment: social   . Drug use: No      Review of Systems Per HPI    Objective:   Physical Exam  Constitutional: She is oriented to person, place, and time. She appears well-developed and well-nourished. No distress.  HENT:  Head: Normocephalic and atraumatic.  Eyes: Conjunctivae are normal.  Cardiovascular: Normal rate.  Pulmonary/Chest: Effort  normal.  Neurological: She is alert and oriented to person, place, and time.  Skin: Skin is warm and dry. She is not diaphoretic.  Bilateral palms of hands with scattered faint erythematous papules. Non tender.  Sole of left foot with foot area of mild hyperpigmentation of middle of arch. Sole of left foot with small area erythema.  Psychiatric: She has a normal mood and affect. Her behavior is normal. Judgment and thought content normal.  Vitals reviewed.     BP (!) 130/98 (BP Location: Right Arm, Patient Position: Sitting, Cuff Size: Normal)   Pulse 96   Temp 98.6 F (37 C) (Oral)   Ht 5' 3.25" (1.607 m)   Wt 156 lb 12 oz (71.1 kg)   SpO2 98%   BMI 27.55 kg/m  Wt Readings from Last 3 Encounters:  05/26/18 156 lb 12 oz (71.1 kg)  12/06/17 156 lb (70.8 kg)  10/12/16 186 lb (84.4 kg)       Assessment & Plan:  1. Rash - unclear etiology, do not suspect related to distant tick bite - augmented betamethasone dipropionate (DIPROLENE-AF) 0.05 % cream; betamethasone, augmented 0.05 % topical cream  APPLY TO AFFECTED AREA 1X PER DAY  Dispense: 50 g; Refill: 1 - clindamycin (CLINDAGEL) 1 % gel; Apply topically 2 (two) times daily.  Dispense: 30 g; Refill: 0 - CBC with Differential - Comprehensive metabolic panel - Sedimentation rate  2. Other fatigue - CBC with Differential - TSH - Comprehensive metabolic panel - Sedimentation rate  3. Palpitations - CBC with Differential - TSH - Comprehensive metabolic panel   Olean Ree, FNP-BC  [facility], [facility]  05/26/2018 8:38 PM

## 2018-05-28 ENCOUNTER — Other Ambulatory Visit (INDEPENDENT_AMBULATORY_CARE_PROVIDER_SITE_OTHER): Payer: 59

## 2018-05-28 ENCOUNTER — Other Ambulatory Visit: Payer: Self-pay | Admitting: Family Medicine

## 2018-05-28 DIAGNOSIS — R7989 Other specified abnormal findings of blood chemistry: Secondary | ICD-10-CM

## 2018-05-28 DIAGNOSIS — R21 Rash and other nonspecific skin eruption: Secondary | ICD-10-CM | POA: Diagnosis not present

## 2018-05-28 DIAGNOSIS — R5383 Other fatigue: Secondary | ICD-10-CM

## 2018-05-28 LAB — T4, FREE: Free T4: 0.8 ng/dL (ref 0.60–1.60)

## 2018-05-28 MED ORDER — LEVOTHYROXINE SODIUM 50 MCG PO TABS: 50.0000 ug | ORAL_TABLET | Freq: Every day | ORAL | 1 refills | Status: DC

## 2018-05-28 NOTE — Addendum Note (Signed)
Addended by: Olean ReeGESSNER, Jaeceon Michelin B on: 05/28/2018 08:12 AM   Modules accepted: Orders

## 2018-05-29 ENCOUNTER — Encounter: Payer: Self-pay | Admitting: Family Medicine

## 2018-05-30 LAB — ANA: Anti Nuclear Antibody(ANA): NEGATIVE

## 2018-07-16 ENCOUNTER — Encounter: Payer: Self-pay | Admitting: Family Medicine

## 2018-07-23 DIAGNOSIS — R300 Dysuria: Secondary | ICD-10-CM | POA: Diagnosis not present

## 2018-07-23 DIAGNOSIS — R319 Hematuria, unspecified: Secondary | ICD-10-CM | POA: Diagnosis not present

## 2018-08-18 DIAGNOSIS — N3010 Interstitial cystitis (chronic) without hematuria: Secondary | ICD-10-CM | POA: Diagnosis not present

## 2018-08-18 DIAGNOSIS — N941 Unspecified dyspareunia: Secondary | ICD-10-CM | POA: Diagnosis not present

## 2018-08-18 DIAGNOSIS — R32 Unspecified urinary incontinence: Secondary | ICD-10-CM | POA: Diagnosis not present

## 2018-08-18 DIAGNOSIS — E039 Hypothyroidism, unspecified: Secondary | ICD-10-CM | POA: Diagnosis not present

## 2018-08-28 DIAGNOSIS — Z23 Encounter for immunization: Secondary | ICD-10-CM | POA: Diagnosis not present

## 2018-10-10 ENCOUNTER — Other Ambulatory Visit: Payer: 59

## 2018-10-31 ENCOUNTER — Other Ambulatory Visit: Payer: Self-pay | Admitting: Obstetrics and Gynecology

## 2018-10-31 ENCOUNTER — Ambulatory Visit
Admission: RE | Admit: 2018-10-31 | Discharge: 2018-10-31 | Disposition: A | Discharge status: Home / Self Care | Payer: 59 | Source: Ambulatory Visit | Attending: Obstetrics and Gynecology | Admitting: Obstetrics and Gynecology

## 2018-10-31 DIAGNOSIS — N6313 Unspecified lump in the right breast, lower outer quadrant: Secondary | ICD-10-CM | POA: Diagnosis not present

## 2018-10-31 DIAGNOSIS — N6310 Unspecified lump in the right breast, unspecified quadrant: Secondary | ICD-10-CM

## 2018-11-17 ENCOUNTER — Encounter: Payer: Self-pay | Admitting: Family Medicine

## 2018-11-17 ENCOUNTER — Other Ambulatory Visit: Payer: Self-pay | Admitting: Family Medicine

## 2018-11-17 DIAGNOSIS — R7989 Other specified abnormal findings of blood chemistry: Secondary | ICD-10-CM

## 2018-11-17 MED ORDER — LEVOTHYROXINE SODIUM 50 MCG PO TABS: 50.0000 ug | ORAL_TABLET | Freq: Every day | ORAL | 0 refills | Status: DC

## 2018-11-25 ENCOUNTER — Encounter: Payer: Self-pay | Admitting: Family Medicine

## 2018-11-25 ENCOUNTER — Telehealth: Payer: Self-pay | Admitting: Family Medicine

## 2018-11-25 NOTE — Telephone Encounter (Signed)
Will defer to PCP

## 2018-11-25 NOTE — Telephone Encounter (Signed)
Pt called office requesting to transfer care to Dr.Cody. Pt asked if any MD's are accepting new patients.

## 2018-12-05 DIAGNOSIS — L709 Acne, unspecified: Secondary | ICD-10-CM | POA: Diagnosis not present

## 2018-12-05 DIAGNOSIS — N3010 Interstitial cystitis (chronic) without hematuria: Secondary | ICD-10-CM | POA: Diagnosis not present

## 2018-12-05 DIAGNOSIS — R5383 Other fatigue: Secondary | ICD-10-CM | POA: Diagnosis not present

## 2018-12-05 DIAGNOSIS — E039 Hypothyroidism, unspecified: Secondary | ICD-10-CM | POA: Diagnosis not present

## 2018-12-05 DIAGNOSIS — E559 Vitamin D deficiency, unspecified: Secondary | ICD-10-CM | POA: Diagnosis not present

## 2018-12-05 NOTE — Telephone Encounter (Signed)
That is fine with me.

## 2019-05-15 ENCOUNTER — Ambulatory Visit
Admission: RE | Admit: 2019-05-15 | Discharge: 2019-05-15 | Disposition: A | Discharge status: Home / Self Care | Payer: 59 | Source: Ambulatory Visit | Attending: Obstetrics and Gynecology | Admitting: Obstetrics and Gynecology

## 2019-05-15 ENCOUNTER — Other Ambulatory Visit: Payer: Self-pay

## 2019-05-15 DIAGNOSIS — N6310 Unspecified lump in the right breast, unspecified quadrant: Secondary | ICD-10-CM

## 2019-08-11 ENCOUNTER — Ambulatory Visit: Payer: 59 | Admitting: Family Medicine

## 2019-08-11 ENCOUNTER — Encounter: Payer: Self-pay | Admitting: Family Medicine

## 2019-08-11 ENCOUNTER — Other Ambulatory Visit: Payer: Self-pay

## 2019-08-11 VITALS — BP 118/80 | HR 80 | Temp 97.8°F | Ht 63.25 in | Wt 152.4 lb

## 2019-08-11 DIAGNOSIS — R519 Headache, unspecified: Secondary | ICD-10-CM | POA: Diagnosis not present

## 2019-08-11 MED ORDER — KETOROLAC TROMETHAMINE 30 MG/ML IJ SOLN
30.0000 mg | Freq: Once | INTRAMUSCULAR | Status: AC
  Administered 2019-08-11: 30 mg via INTRAMUSCULAR

## 2019-08-11 MED ORDER — CYCLOBENZAPRINE HCL 5 MG PO TABS: 5.0000 mg | ORAL_TABLET | Freq: Two times a day (BID) | ORAL | 0 refills | Status: DC | PRN

## 2019-08-11 NOTE — Patient Instructions (Addendum)
I don't think this is infection related. Possible migraine.  Treat with toradol shot today.  Start taking flexeril muscle relaxant as needed for headache. Caution it can make you sleepy. I would like to refer you to neurology for further evaluation.  Continue claritin for allergies.  Letter for work provided today.  Call to schedule eye exam as well.

## 2019-08-11 NOTE — Progress Notes (Signed)
This visit was conducted in person.  BP 118/80 (BP Location: Left Arm, Patient Position: Sitting, Cuff Size: Normal)   Pulse 80   Temp 97.8 F (36.6 C) (Temporal)   Ht 5' 3.25" (1.607 m)   Wt 152 lb 6 oz (69.1 kg)   LMP 07/30/2019   SpO2 99%   BMI 26.78 kg/m    CC: HA, ST Subjective:    Patient ID: Martha Coleman, female    DOB: 1986-06-22, 33 y.o.   MRN: [id number]  HPI: Martha Coleman is a 33 y.o. female presenting on 08/11/2019 for Headache (C/o constant HA, painful pressure behind right eye and slight sore throat.  COVID test yesterday, neg. )   8-9d h/o constant pressure headache behind R eye associated with mild sore throat, rhinorrhea. Now having R eye pain over the last 4 days, as well as mild R earache. Some nausea without vomiting from headache. This headache can be activity limiting. Some photophobia, no phonophobia.  She has been treating with claritin, as well as tylenol, ibuprofen, excedrin without much relief.   No fevers/chills, cough, dyspnea, tooth pain, PNdrainage, abd pain, diarrhea, body aches, loss of taste/smell. No vision changes.  No new meds, vitamins, supplements.   Increasing headaches over the last few years, has seen neurologist - thought [place] from ibuprofen use. rec MRI which she never had done. No formal dx migraines in the past.   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   LMP 1-2 wks ago.  She is on daily OCP.  Ex smoker.  Last eye exam - several years ago.     Relevant past medical, surgical, family and social history reviewed and updated as indicated. Interim medical history since our last visit reviewed. Allergies and medications reviewed and updated. Outpatient Medications Prior to Visit  Medication Sig Dispense Refill  . Levonorgestrel-Ethinyl Estrad (SRONYX PO) Take 1 tablet by mouth daily.    Marland Kitchen loratadine (CLARITIN) 10 MG tablet Take 10 mg by mouth daily.    Marland Kitchen spironolactone  (ALDACTONE) 50 MG tablet Take 50 mg by mouth daily. with food    . augmented betamethasone dipropionate (DIPROLENE-AF) 0.05 % cream betamethasone, augmented 0.05 % topical cream  APPLY TO AFFECTED AREA 1X PER DAY 50 g 1  . clindamycin (CLINDAGEL) 1 % gel Apply topically 2 (two) times daily. 30 g 0  . levothyroxine (SYNTHROID, LEVOTHROID) 50 MCG tablet Take 1 tablet (50 mcg total) by mouth daily. 90 tablet 0   No facility-administered medications prior to visit.      Per HPI unless specifically indicated in ROS section below Review of Systems Objective:    BP 118/80 (BP Location: Left Arm, Patient Position: Sitting, Cuff Size: Normal)   Pulse 80   Temp 97.8 F (36.6 C) (Temporal)   Ht 5' 3.25" (1.607 m)   Wt 152 lb 6 oz (69.1 kg)   LMP 07/30/2019   SpO2 99%   BMI 26.78 kg/m   Wt Readings from Last 3 Encounters:  08/11/19 152 lb 6 oz (69.1 kg)  05/26/18 156 lb 12 oz (71.1 kg)  12/06/17 156 lb (70.8 kg)    Physical Exam Vitals signs and nursing note reviewed.  Constitutional:      General: She is not in acute distress.    Appearance: Normal appearance. She is well-developed. She is not ill-appearing.  HENT:     Head: Normocephalic and atraumatic.     Right Ear: Hearing, tympanic  membrane, ear canal and external ear normal.     Left Ear: Hearing, tympanic membrane, ear canal and external ear normal.     Nose: Nose normal. No mucosal edema, congestion or rhinorrhea.     Right Sinus: No maxillary sinus tenderness or frontal sinus tenderness.     Left Sinus: No maxillary sinus tenderness or frontal sinus tenderness.     Mouth/Throat:     Mouth: Mucous membranes are moist.     Pharynx: Oropharynx is clear. Uvula midline. No posterior oropharyngeal erythema.     Tonsils: No tonsillar abscesses.  Eyes:     General: No scleral icterus.    Extraocular Movements: Extraocular movements intact.     Conjunctiva/sclera: Conjunctivae normal.     Pupils: Pupils are equal, round, and  reactive to light.     Comments: No obvious papilledema on limited fundoscopic exam  Neck:     Musculoskeletal: Normal range of motion and neck supple.  Cardiovascular:     Rate and Rhythm: Normal rate and regular rhythm.     Pulses: Normal pulses.     Heart sounds: Normal heart sounds. No murmur.  Pulmonary:     Effort: Pulmonary effort is normal. No respiratory distress.     Breath sounds: Normal breath sounds. No wheezing, rhonchi or rales.  Lymphadenopathy:     Cervical: No cervical adenopathy.  Skin:    General: Skin is warm and dry.     Findings: No rash.  Neurological:     General: No focal deficit present.     Mental Status: She is alert.     Cranial Nerves: Cranial nerves are intact.     Sensory: Sensation is intact.     Motor: Motor function is intact.     Coordination: Coordination is intact.     Gait: Gait is intact.     Comments:  CN 2-12 intact EOMI FTN intact  Psychiatric:        Mood and Affect: Mood normal.        Behavior: Behavior normal.       Results for orders placed or performed in visit on 05/28/18  T4, Free  Result Value Ref Range   Free T4 0.80 0.60 - 1.60 ng/dL  ANA  Result Value Ref Range   Anti Nuclear Antibody(ANA) NEGATIVE NEGATIVE   Lab Results  Component Value Date   CREATININE 0.92 05/26/2018   BUN 10 05/26/2018   NA 137 05/26/2018   K 4.1 05/26/2018   CL 103 05/26/2018   CO2 26 05/26/2018    Assessment & Plan:   Problem List Items Addressed This Visit    Headache - Primary    Headache with migraine characteristics present over the last 1+ week, now localizing behind R eye. Suspect new onset migraine. Benign neurological exam, although I did recommend she schedule vision exam. Treat with toradol IM in office today, then Rx flexeril PRN headache at home (with sedation precautions).  Symptoms not consistent with Covid (and has had negative swab), not consistent with sinusitis.  She did see neurology remotely for headaches and  vision change with normal CT, at that time thought occipital neuralgia + MOH, never followed up with neuro or for recommended MRI. Will refer back to neuro for further evaluation as well especially if ongoing headache despite treatment.  She is on daily OCP, but doubt related to venous sinus thrombosis.       Relevant Medications   cyclobenzaprine (FLEXERIL) 5 MG tablet   Other  Relevant Orders   Ambulatory referral to Neurology       Meds ordered this encounter  Medications  . cyclobenzaprine (FLEXERIL) 5 MG tablet    Sig: Take 1 tablet (5 mg total) by mouth 2 (two) times daily as needed (headache, sedation precautions).    Dispense:  30 tablet    Refill:  0  . ketorolac (TORADOL) 30 MG/ML injection 30 mg   Orders Placed This Encounter  Procedures  . Ambulatory referral to Neurology    Referral Priority:   Routine    Referral Type:   Consultation    Referral Reason:   Specialty Services Required    Requested Specialty:   Neurology    Number of Visits Requested:   1    Patient Instructions  I don't think this is infection related. Possible migraine.  Treat with toradol shot today.  Start taking flexeril muscle relaxant as needed for headache. Caution it can make you sleepy. I would like to refer you to neurology for further evaluation.  Continue claritin for allergies.  Letter for work provided today.  Call to schedule eye exam as well.    Follow up plan: No follow-ups on file.  Eustaquio Boyden, MD

## 2019-08-13 ENCOUNTER — Telehealth: Payer: Self-pay | Admitting: Family Medicine

## 2019-08-13 DIAGNOSIS — R519 Headache, unspecified: Secondary | ICD-10-CM | POA: Insufficient documentation

## 2019-08-13 NOTE — Telephone Encounter (Signed)
Left message on vm per dpr relaying Dr. G's message. Asked pt to call back with answers to Dr. G's questions. 

## 2019-08-13 NOTE — Telephone Encounter (Signed)
plz call for update on headache symptoms - was treated as possible new onset migraine. How did she do with toradol shot in office, how is flexeril helping with headache?  Was she able to schedule eye doctor appointment?  If headache resolved, we could cancel neuro referral.

## 2019-08-13 NOTE — Assessment & Plan Note (Addendum)
Headache with migraine characteristics present over the last 1+ week, now localizing behind R eye. Suspect new onset migraine. Benign neurological exam, although I did recommend she schedule vision exam. Treat with toradol IM in office today, then Rx flexeril PRN headache at home (with sedation precautions).  Symptoms not consistent with Covid (and has had negative swab), not consistent with sinusitis.  She did see neurology remotely for headaches and vision change with normal CT, at that time thought occipital neuralgia + MOH, never followed up with neuro or for recommended MRI. Will refer back to neuro for further evaluation as well especially if ongoing headache despite treatment.  She is on daily OCP, but doubt related to venous sinus thrombosis.

## 2019-08-17 NOTE — Telephone Encounter (Signed)
Left message to follow up with patient

## 2019-08-17 NOTE — Telephone Encounter (Signed)
Left message for patient to call back and left details with questions below and to call back with feedback from patient

## 2019-08-17 NOTE — Telephone Encounter (Signed)
Pt returned your call  Best number [phone]

## 2019-08-19 NOTE — Telephone Encounter (Signed)
Left message on vm per dpr relaying Dr. G's message. Asked pt to call back with answers to Dr. G's questions. 

## 2019-08-20 NOTE — Telephone Encounter (Signed)
Left message on vm per dpr relaying Dr. G's message. Asked pt to call back with answers to Dr. G's questions. 

## 2019-08-21 NOTE — Telephone Encounter (Signed)
Pt returning call.  Asked about HA.  Says it had improved and the Toradol seemed to help.  But the HA is a constant dull pain now and still has minor pressure behind her eye.  Pt has neuro appt on 09/18/19 and is still working on getting eye appt.

## 2019-08-26 ENCOUNTER — Telehealth: Payer: Self-pay | Admitting: Family Medicine

## 2019-08-26 ENCOUNTER — Ambulatory Visit (INDEPENDENT_AMBULATORY_CARE_PROVIDER_SITE_OTHER): Payer: 59 | Admitting: Family Medicine

## 2019-08-26 ENCOUNTER — Other Ambulatory Visit: Payer: Self-pay

## 2019-08-26 ENCOUNTER — Encounter: Payer: Self-pay | Admitting: Family Medicine

## 2019-08-26 VITALS — BP 110/70 | HR 79 | Temp 97.7°F | Ht 63.25 in | Wt 154.6 lb

## 2019-08-26 DIAGNOSIS — G44039 Episodic paroxysmal hemicrania, not intractable: Secondary | ICD-10-CM

## 2019-08-26 MED ORDER — INDOMETHACIN 25 MG PO CAPS: 25.0000 mg | ORAL_CAPSULE | Freq: Three times a day (TID) | ORAL | 0 refills | Status: DC

## 2019-08-26 NOTE — Assessment & Plan Note (Addendum)
Recurrent unilateral headache around R eye with some autonomic features but non focal neurological exam suspicious for paroxysmal hemicrania. Also some migraine features ?second headache syndrome concomitantly. Discussed with patient. Episodes not severe excruciating to suggest cluster headache, she did not really respond to 15 minutes of high flow oxygen in office. Will Rx indocin 25mg  TID AC for next several days while she gets in with neurology. Reviewed risks of medication, she will start pepcid if GI symptoms develop, consider Rx PPI. Discussed head imaging - as she did have MRI/A head/neck 2016 will defer rpt imaging at this time until she sees neurology, or unless not responding to indocin treatment. Update if not improving with this treatment. Pt agrees with plan.

## 2019-08-26 NOTE — Telephone Encounter (Signed)
Pt is already scheduled with Dr. Darnell Level today @ 1:30pm

## 2019-08-26 NOTE — Telephone Encounter (Signed)
Pt wanted to transfer care from debbie to dr g.  Ok to transfer??

## 2019-08-26 NOTE — Telephone Encounter (Signed)
Patient called back today in regards to her referral to Neuro  She stated that they were not able to get her in until Nov 13th and she stated that today her migraine has been really bad. She wanted to know if she could get another shot or if something could be called in for her.

## 2019-08-26 NOTE — Telephone Encounter (Signed)
That is fine with me.

## 2019-08-26 NOTE — Progress Notes (Signed)
This visit was conducted in person.  BP 110/70 (BP Location: Left Arm, Patient Position: Sitting, Cuff Size: Normal)   Pulse 79   Temp 97.7 F (36.5 C) (Temporal)   Ht 5' 3.25" (1.607 m)   Wt 154 lb 9 oz (70.1 kg)   LMP 07/30/2019   SpO2 98% Comment: Administered O2 in office, 10 L by mask for 15 mins.  BMI 27.16 kg/m    CC: HA Subjective:    Patient ID: Martha Coleman, female    DOB: 1986-04-06, 33 y.o.   MRN: [id number]  HPI: Martha Coleman is a 33 y.o. female presenting on 08/26/2019 for Headache (C/o still having sharp pains on right side of head and in right eye.  States inj helped. )   See prior note for details.  Last visit seen for constant R sided pressure headache behind R eye associated with nausea and photophobia, thought new onset migraines treated with toradol IM with benefit then flexeril PRN abortively (overly sedating).   Sharp pain at R forehead associated with R eye pressure/ache started last week, happening twice a day, but today has had 3 episodes this morning with residual pressure pain of eye. Again had R eye pain associated with watery eye and some rhinorrhea. Pain improves in a dark room.   No burning pain or electrical shock like pain. No numbness/paresthesias to R face.  No drooping of eyelid noted with attack. No increased sweating noted with attack.   She has not yet scheduled eye exam.  Neurology appt scheduled for next month.  Continues OCP - same one for the past 2 yrs, prior used IUD. More headaches over the last year.   Drinks 1 pack of caffeine per day.      Relevant past medical, surgical, family and social history reviewed and updated as indicated. Interim medical history since our last visit reviewed. Allergies and medications reviewed and updated. Outpatient Medications Prior to Visit  Medication Sig Dispense Refill  . Levonorgestrel-Ethinyl Estrad (SRONYX PO) Take 1 tablet by mouth daily.    Marland Kitchen loratadine (CLARITIN) 10 MG tablet  Take 10 mg by mouth daily.    Marland Kitchen spironolactone (ALDACTONE) 50 MG tablet Take 50 mg by mouth daily. with food    . cyclobenzaprine (FLEXERIL) 5 MG tablet Take 1 tablet (5 mg total) by mouth 2 (two) times daily as needed (headache, sedation precautions). 30 tablet 0   No facility-administered medications prior to visit.      Per HPI unless specifically indicated in ROS section below Review of Systems Objective:    BP 110/70 (BP Location: Left Arm, Patient Position: Sitting, Cuff Size: Normal)   Pulse 79   Temp 97.7 F (36.5 C) (Temporal)   Ht 5' 3.25" (1.607 m)   Wt 154 lb 9 oz (70.1 kg)   LMP 07/30/2019   SpO2 98% Comment: Administered O2 in office, 10 L by mask for 15 mins.  BMI 27.16 kg/m   Wt Readings from Last 3 Encounters:  08/26/19 154 lb 9 oz (70.1 kg)  08/11/19 152 lb 6 oz (69.1 kg)  05/26/18 156 lb 12 oz (71.1 kg)    Physical Exam Vitals signs and nursing note reviewed.  Constitutional:      General: She is not in acute distress.    Appearance: Normal appearance. She is not ill-appearing.     Comments: Uncomfortable appearing  HENT:     Head: Normocephalic and atraumatic.     Nose: Nose normal. No congestion.  Mouth/Throat:     Mouth: Mucous membranes are moist.     Pharynx: Oropharynx is clear. No oropharyngeal exudate.  Eyes:     Extraocular Movements: Extraocular movements intact.     Conjunctiva/sclera: Conjunctivae normal.     Pupils: Pupils are equal, round, and reactive to light.  Neck:     Musculoskeletal: Normal range of motion and neck supple.  Skin:    General: Skin is warm and dry.     Capillary Refill: Capillary refill takes less than 2 seconds.  Neurological:     General: No focal deficit present.     Mental Status: She is alert and oriented to person, place, and time. Mental status is at baseline.     Cranial Nerves: No cranial nerve deficit.     Sensory: Sensation is intact.     Motor: Motor function is intact.     Coordination:  Coordination is intact.     Gait: Gait is intact.     Comments:  CN 2-12 intact FTN intact EOMI  Gait, stance intact  Psychiatric:        Mood and Affect: Mood normal.        Behavior: Behavior normal.       Assessment & Plan:   Problem List Items Addressed This Visit    Headache - Primary    Recurrent unilateral headache around R eye with some autonomic features but non focal neurological exam suspicious for paroxysmal hemicrania. Also some migraine features ?second headache syndrome concomitantly. Discussed with patient. Episodes not severe excruciating to suggest cluster headache, she did not really respond to 15 minutes of high flow oxygen in office. Will Rx indocin 25mg  TID AC for next several days while she gets in with neurology. Reviewed risks of medication, she will start pepcid if GI symptoms develop, consider Rx PPI. Discussed head imaging - as she did have MRI/A head/neck 2016 will defer rpt imaging at this time until she sees neurology, or unless not responding to indocin treatment. Update if not improving with this treatment. Pt agrees with plan.       Relevant Medications   indomethacin (INDOCIN) 25 MG capsule       Meds ordered this encounter  Medications  . indomethacin (INDOCIN) 25 MG capsule    Sig: Take 1 capsule (25 mg total) by mouth 3 (three) times daily with meals.    Dispense:  90 capsule    Refill:  0   No orders of the defined types were placed in this encounter.   Patient instructions: I am suspicious for paroxysmal hemicrania - which is in the family of cluster headaches, vs migraine.  High flow oxygen treatment today wasn't very helpful.  Treat with indocin 25mg  three times daily with food. If no improvement after 3 days or any worsening, let me know for head imaging.  Keep neurology appointment.   Follow up plan: No follow-ups on file.  2017, MD

## 2019-08-26 NOTE — Telephone Encounter (Signed)
Box by me - I saw recently for new HAs.

## 2019-08-26 NOTE — Telephone Encounter (Signed)
Can she come in to see PCP at 10:15am today? If not I could see at 1:30pm today.

## 2019-08-26 NOTE — Patient Instructions (Addendum)
I am suspicious for paroxysmal hemicrania - which is in the family of cluster headaches, vs migraine.  High flow oxygen treatment today wasn't very helpful.  Treat with indocin 25mg  three times daily with food. If no improvement after 3 days or any worsening, let me know for head imaging.  Keep neurology appointment.   Indomethacin-Responsive Headache, Adult An indomethacin-responsive headache is a headache that gets better when you take indomethacin. Indomethacin is a kind of NSAID (nonsteroidal anti-inflammatory drug). Indomethacin can quickly stop the pain from some kinds of headaches, such as:  Paroxysmal hemicrania. This is a series of short, severe headaches, usually on just one side of the head.  Hemicrania continua. Pain is nonstop and on one side of the face.  Primary exertional headache. Exercise sets off these headaches.  Primary cough headache. Pain may come from pressure in the brain when coughing or straining. What are the causes? The exact cause of this condition is not known. Certain conditions may start (trigger) a headache. They include:  Moving the head in certain ways.  Stress.  Pressure on sensitive areas of the neck.  Drinking alcohol.  Exercise.  Coughing and sneezing. What increases the risk? The following factors may make you more likely to develop this condition:  Being 76 years of age or older.  Having a serious head injury.  Having migraine headaches.  Having a family history of this condition. What are the signs or symptoms? Symptoms of this condition depend on the kind of headache you have.  Paroxysmal hemicrania: ? Having about 10 headaches a day. Each may last from a few minutes to 2 hours. ? Severe, pounding pain. ? Pain usually on just one side of the head. It often centers around the eye or in the forehead. ? A watery eye, which may become red or swollen. ? A droopy or swollen eyelid. ? Sweating and having a red or pinkish face. ? A  stuffy, runny nose.  Hemicrania continua: ? All-day headache. This may occur daily for at least 3 months. Then there may be no headaches for weeks or months. ? Pain that gets worse several times during the day. ? Pain in the face, on one side only. It almost always occurs on the same side. ? A watery eye. It may also become droopy, red, and swollen. ? A stuffy, runny nose. ? Pain that gets worse with sound or light.  Primary exertional headache: ? Pain during physical activity. ? Pounding or throbbing pain. ? Pain that lasts for 5 minutes to 48 hours, or sometimes longer.  Primary cough headache: ? Pain that starts after coughing, sneezing, or straining. ? Sharp, stabbing pain. ? Pain on both sides of the head. It is often worse in the back of the head. ? Pain that is severe for a few minutes and then dull for several hours. How is this diagnosed? This condition may be diagnosed based on:  Symptoms and medical history. Your health care provider will ask you questions about your headaches.  Physical exam.  Tests that may include: ? Blood and urine tests. ? Spinal tap (lumbar puncture). This tests a sample of fluid from your spine. The test checks for infection, bleeding in your brain (brain hemorrhage), or extra pressure inside your skull. ? Ultrasound, MRI, CT scan, or other imaging tests. If no medical condition is causing your headaches, you will be given indomethacin. If yours is an indomethacin-responsive headache, your symptoms should go away quickly. How is this treated?  By taking indomethacin.  With other medicines: ? To prevent or treat stomach ulcers. ? To relieve stomachache or heartburn (antacids). ? For nausea. Follow these instructions at home: Lifestyle  Rest in a dark, quiet room.  Put a cool, damp washcloth on your head or face.  Get plenty of sleep. Most adults should get at least 7-9 hours of sleep each night.  Eat on a regular schedule. Do not skip  meals.  Limit alcohol intake to no more than 1 drink per day for non-pregnant women and 2 drinks per day for men. One drink equals 12 ounces of beer, 5 ounces of wine, or 1 ounces of hard liquor.  Do not use any products that contain nicotine or tobacco, such as cigarettes and e-cigarettes. If you need help quitting, ask your health care provider. Headache diary Keep a headache diary. This will help you and your health care provider determine what is triggering your headaches. Each time you have a headache, write down:  When it started and stopped. Include the day and time.  How it felt.  Any triggers, such as noise, stress, or foods.  Any medicines you took.  General instructions  Take over-the-counter and prescription medicines only as told by your health care provider. ? Do not take other NSAIDs, such as ibuprofen, with indomethacin.  Tell your health care provider about all medicines you are taking, including vitamins, herbs, eye drops, creams, and over-the-counter medicines.  Keep all follow-up visits as told by your health care provider. This is important. Contact a health care provider if:  Your pain continues even with treatment.  You have nausea.  You have a fever. Get help right away if:  You have bad stomach pain or vomiting.  You vomit blood.  You have blood in your stool.  You have chest pain.  You have any symptoms of a stroke. "BE FAST" is an easy way to remember the main warning signs of a stroke: ? B - Balance. Signs are dizziness, sudden trouble walking, or loss of balance. ? E - Eyes. Signs are trouble seeing or a sudden change in vision. ? F - Face. Signs are sudden weakness or numbness of the face, or the face or eyelid drooping on one side. ? A - Arms. Signs are weakness or numbness in an arm. This happens suddenly and usually on one side of the body. ? S - Speech. Signs are sudden trouble speaking, slurred speech, or trouble understanding what  people say. ? T - Time. Time to call emergency services. Write down what time symptoms started.  You have other signs of a stroke, such as: ? A sudden, severe headache. ? Nausea or vomiting. ? Seizure. Summary  An indomethacin-responsive headache is a headache that gets better when you take indomethacin, a medicine that stops inflammation.  The exact cause of this condition is not known, but there are certain conditions that may start (trigger) a headache.  Keep a headache diary to help your health care provider determine your triggers.  Treatment of this condition includes indomethacin, but it may include other medicines to relieve other symptoms. This information is not intended to replace advice given to you by your health care provider. Make sure you discuss any questions you have with your health care provider. Document Released: 10/10/2009 Document Revised: 12/15/2018 Document Reviewed: 11/02/2017 Elsevier Patient Education  2020 [employer].

## 2019-08-26 NOTE — Telephone Encounter (Signed)
Can you please call patient to schedule this afternoon with Dr. Darnell Level?

## 2019-08-31 NOTE — Telephone Encounter (Signed)
Left message asking pt to call office  °

## 2019-09-01 ENCOUNTER — Encounter (HOSPITAL_COMMUNITY): Payer: Self-pay

## 2019-09-01 ENCOUNTER — Ambulatory Visit (INDEPENDENT_AMBULATORY_CARE_PROVIDER_SITE_OTHER)
Admission: EM | Admit: 2019-09-01 | Discharge: 2019-09-01 | Disposition: A | Discharge status: Home / Self Care | Payer: 59 | Source: Home / Self Care | Attending: Emergency Medicine | Admitting: Emergency Medicine

## 2019-09-01 ENCOUNTER — Other Ambulatory Visit: Payer: Self-pay

## 2019-09-01 ENCOUNTER — Encounter (HOSPITAL_COMMUNITY): Payer: Self-pay | Admitting: Emergency Medicine

## 2019-09-01 ENCOUNTER — Telehealth: Payer: Self-pay | Admitting: *Deleted

## 2019-09-01 DIAGNOSIS — R519 Headache, unspecified: Secondary | ICD-10-CM | POA: Insufficient documentation

## 2019-09-01 DIAGNOSIS — Z87891 Personal history of nicotine dependence: Secondary | ICD-10-CM | POA: Diagnosis not present

## 2019-09-01 DIAGNOSIS — R202 Paresthesia of skin: Secondary | ICD-10-CM | POA: Insufficient documentation

## 2019-09-01 DIAGNOSIS — Z79899 Other long term (current) drug therapy: Secondary | ICD-10-CM | POA: Insufficient documentation

## 2019-09-01 DIAGNOSIS — G44049 Chronic paroxysmal hemicrania, not intractable: Secondary | ICD-10-CM

## 2019-09-01 NOTE — Telephone Encounter (Signed)
Spoke with pt about HA.  States the indomethacin helped at first but as of today it's not doing anything.  Says pain is now in the whole right side of her head, down to the neck.  Pt wants to go ahead with MRI.

## 2019-09-01 NOTE — Telephone Encounter (Addendum)
plz notify - I have ordered MRI.  I also want her to increase indocin to 50mg  three times daily for next 3 days and call us with an update.

## 2019-09-01 NOTE — Telephone Encounter (Signed)
Left message on vm per dpr relaying Dr. G's message.  

## 2019-09-01 NOTE — ED Provider Notes (Signed)
HPI  SUBJECTIVE:  Martha Coleman is a 33 y.o. female who reports daily constant headache starting about a month ago.  She states that initially it was on the right side, sharp, but now involves her entire head,  face, neck.  She states that her teeth hurt when they touch.  She reports nausea, photophobia, phonophobia.  She reports that sensation on the right side of her face feels different than the left.  She denies facial numbness.  States that this is the worst her headache has been thus far, states that this is "the worst headache I've ever had".  She denies vomiting, rash, neck stiffness, purulent nasal drainage, eyestrain, ear pain.  No jaw pain.  States that her teeth hurt when she touches them together.  No facial droop, arm or leg weakness, slurred speech, discoordination, visual changes.  She was started on indomethacin 6 days ago, but states that it was not working, but stopped working today.  She has also tried Flexeril, ibuprofen/Tylenol/Excedrin in the past.  Symptoms are worse with neck movement, touching her teeth together.  Patient seen by PMD October 6 for headache, was thought to be a migraine, and not sinusitis or venous sinus thrombosis.  She was given Toradol, referred back to neurology, sent home with Flexeril.  Returned on 10/21 for recurrent unilateral headache on the right side, did not respond to oxygen, was not thought to be cluster headache.  She was prescribed Indocin 25 mg 3 times daily, again referred to neurology.  She was advised to follow-up with him for imaging if headache did not respond to the Indocin.  Past medical history negative for aneurysm, SAH/ICH, frequent sinusitis, stroke, glaucoma, atrial fibrillation, temporal arteritis, migraines, diabetes, hypertension, DVT, PE, hypercoagulability, antiplatelet, anticoagulant use.  She is on OCPs.  Family history negative for stroke although she states that her mom gets "blood clots".  QJJ:HERDEYC, Binnie Rail, FNP She  has an appointment with neurology on November 13.  Past Medical History:  Diagnosis Date  . Genital warts   . Normal pregnancy 06/12/2011  . SVD (spontaneous vaginal delivery) 10/12/2016    Past Surgical History:  Procedure Laterality Date  . HYSTEROSCOPY    . NO PAST SURGERIES    . TONSILLECTOMY AND ADENOIDECTOMY    . WISDOM TOOTH EXTRACTION      Family History  Problem Relation Age of Onset  . Clotting disorder Mother   . Diabetes Maternal Grandmother   . Hypertension Maternal Grandmother     Social History   Tobacco Use  . Smoking status: Former Games developer  . Smokeless tobacco: Never Used  Substance Use Topics  . Alcohol use: Yes    Alcohol/week: 0.0 standard drinks    Comment: social   . Drug use: No    No current facility-administered medications for this encounter.   Current Outpatient Medications:  .  indomethacin (INDOCIN) 25 MG capsule, Take 1 capsule (25 mg total) by mouth 3 (three) times daily with meals., Disp: 90 capsule, Rfl: 0 .  Levonorgestrel-Ethinyl Estrad (SRONYX PO), Take 1 tablet by mouth daily., Disp: , Rfl:  .  loratadine (CLARITIN) 10 MG tablet, Take 10 mg by mouth daily., Disp: , Rfl:  .  spironolactone (ALDACTONE) 50 MG tablet, Take 50 mg by mouth daily. with food, Disp: , Rfl:   Allergies  Allergen Reactions  . Doxycycline Rash     ROS  As noted in HPI.   Physical Exam  BP (!) 147/96 (BP Location: Right Arm)  Pulse 78   Temp 97.8 F (36.6 C) (Tympanic)   Resp 16   LMP 08/31/2019   SpO2 100%   Constitutional: Well developed, well nourished, no acute distress Eyes: PERRL, EOMI, conjunctiva normal bilaterally. Fundoscopic normal b/l. HENT: Normocephalic, atraumatic,mucus membranes moist, normal dentition.  TM normal b/l. No TMJ tenderness. No nasal congestion, no sinus tenderness. No temporal artery tenderness.  Neck: no cervical LN - trapezial muscle tenderness. No meningismus Respiratory: normal inspiratory  effort Cardiovascular: Normal rate, regular rhythm GI:  nondistended skin: No rash, skin intact Musculoskeletal: No edema, no tenderness, no deformities Neurologic: Alert & oriented x 3, CN III-XII intact, romberg neg, finger-> nose, heel-> shin equal b/l, Romberg neg, tandem gait steady Psychiatric: Speech and behavior appropriate   ED Course   Medications - No data to display  No orders of the defined types were placed in this encounter.  No results found for this or any previous visit (from the past 24 hour(s)). No results found.   ED Clinical Impression  1. Acute intractable headache, unspecified headache type     ED Assessment/Plan  Outside records reviewed.  As noted in HPI.  Patient with a headache for a month.  Patient's headache has changed in character today, patient states this is the worst headache she has ever had without prompting, it is no longer controlled with medications.  Feel that patient needs a more comprehensive work-up than what can be done here at the urgent care.  Doubt stroke, sinusitis, meningitis.  In the differential is complex migraine, cavernous sinus thrombosis, leaking aneurysm, mass  Transferring to the [facility] long emergency department for further evaluation, pain management.  Patient is stable to go via private vehicle.  Discussed rationale for transfer to the emergency department.  Patient agrees with plan.  No orders of the defined types were placed in this encounter.   *This clinic note was created using Dragon dictation software. Therefore, there may be occasional mistakes despite careful proofreading.  ?    Melynda Ripple, MD 09/01/19 [phone]

## 2019-09-01 NOTE — Discharge Instructions (Addendum)
Go immediately to the [facility] emergency room.  Let them know if your headache changes, gets worse.

## 2019-09-01 NOTE — Addendum Note (Signed)
Addended by: Ria Bush on: 09/01/2019 05:33 PM   Modules accepted: Orders

## 2019-09-01 NOTE — ED Triage Notes (Signed)
Patient here from home with complaints of headache x1 month. Toradol with no relief. Seen multiple times for same.

## 2019-09-01 NOTE — Telephone Encounter (Signed)
Pt left VM at Triage for Dr. Darnell Level, she said he saw her recently regarding HA/ Migraines she said that the indomethacin helped her migraines at 1st a little bit but now the med isn't helping at all she is still dealing with severe HAs. She said Dr. Darnell Level mentioned possibly getting an MRI. Pt would like to proceed with MRI if Dr. Darnell Level is okay with that or she asked what should she do next while she is waiting for her neurology appt

## 2019-09-01 NOTE — ED Triage Notes (Signed)
Pt presents to UC w/ c/o headache x1 month. Pt states she has seen her PCP and has started indomethacin which helped some but now it has stopped helping. Pt has an appt w/ a neurologist. Pt states pain is currently all over face, head, and neck. Pt states the right side of her face feels "different" than usual at this time.

## 2019-09-01 NOTE — Telephone Encounter (Signed)
Best number [phone]  Pt called back wanting to know what she needs to do.  She is having a really bad headache today

## 2019-09-02 ENCOUNTER — Encounter: Payer: Self-pay | Admitting: Family Medicine

## 2019-09-02 ENCOUNTER — Emergency Department (HOSPITAL_COMMUNITY)
Admission: EM | Admit: 2019-09-02 | Discharge: 2019-09-02 | Disposition: A | Discharge status: Home / Self Care | Payer: 59 | Attending: Emergency Medicine | Admitting: Emergency Medicine

## 2019-09-02 ENCOUNTER — Telehealth: Payer: Self-pay

## 2019-09-02 DIAGNOSIS — R519 Headache, unspecified: Secondary | ICD-10-CM

## 2019-09-02 DIAGNOSIS — Z8349 Family history of other endocrine, nutritional and metabolic diseases: Secondary | ICD-10-CM

## 2019-09-02 LAB — CBC WITH DIFFERENTIAL/PLATELET
Abs Immature Granulocytes: 0.02 10*3/uL (ref 0.00–0.07)
Basophils Absolute: 0.1 10*3/uL (ref 0.0–0.1)
Basophils Relative: 1 %
Eosinophils Absolute: 0.1 10*3/uL (ref 0.0–0.5)
Eosinophils Relative: 2 %
HCT: 47.7 % — ABNORMAL HIGH (ref 36.0–46.0)
Hemoglobin: 16.8 g/dL — ABNORMAL HIGH (ref 12.0–15.0)
Immature Granulocytes: 0 %
Lymphocytes Relative: 23 %
Lymphs Abs: 1.9 10*3/uL (ref 0.7–4.0)
MCH: 31 pg (ref 26.0–34.0)
MCHC: 35.2 g/dL (ref 30.0–36.0)
MCV: 88 fL (ref 80.0–100.0)
Monocytes Absolute: 0.4 10*3/uL (ref 0.1–1.0)
Monocytes Relative: 5 %
Neutro Abs: 5.7 10*3/uL (ref 1.7–7.7)
Neutrophils Relative %: 69 %
Platelets: 325 10*3/uL (ref 150–400)
RBC: 5.42 MIL/uL — ABNORMAL HIGH (ref 3.87–5.11)
RDW: 11.3 % — ABNORMAL LOW (ref 11.5–15.5)
WBC: 8.3 10*3/uL (ref 4.0–10.5)
nRBC: 0 % (ref 0.0–0.2)

## 2019-09-02 LAB — I-STAT BETA HCG BLOOD, ED (MC, WL, AP ONLY): I-stat hCG, quantitative: <5 m[IU]/mL (ref <5)

## 2019-09-02 LAB — COMPREHENSIVE METABOLIC PANEL
ALT: 22 U/L (ref 0–44)
AST: 16 U/L (ref 15–41)
Albumin: 4 g/dL (ref 3.5–5.0)
Alkaline Phosphatase: 46 U/L (ref 38–126)
Anion gap: 8 (ref 5–15)
BUN: 10 mg/dL (ref 6–20)
CO2: 26 mmol/L (ref 22–32)
Calcium: 9.4 mg/dL (ref 8.9–10.3)
Chloride: 106 mmol/L (ref 98–111)
Creatinine, Ser: 0.78 mg/dL (ref 0.44–1.00)
GFR calc Af Amer: >60 mL/min (ref >60)
GFR calc non Af Amer: >60 mL/min (ref >60)
Glucose, Bld: 91 mg/dL (ref 70–99)
Potassium: 3.6 mmol/L (ref 3.5–5.1)
Sodium: 140 mmol/L (ref 135–145)
Total Bilirubin: 0.6 mg/dL (ref 0.3–1.2)
Total Protein: 7.6 g/dL (ref 6.5–8.1)

## 2019-09-02 MED ORDER — PROCHLORPERAZINE EDISYLATE 10 MG/2ML IJ SOLN
10.0000 mg | Freq: Once | INTRAMUSCULAR | Status: AC
  Administered 2019-09-02: 10 mg via INTRAMUSCULAR
  Filled 2019-09-02: qty 2

## 2019-09-02 MED ORDER — DEXAMETHASONE 4 MG PO TABS
10.0000 mg | ORAL_TABLET | Freq: Once | ORAL | Status: AC
  Administered 2019-09-02: 10 mg via ORAL
  Filled 2019-09-02: qty 2

## 2019-09-02 NOTE — Discharge Instructions (Signed)
You may use over-the-counter Motrin (Ibuprofen), Acetaminophen (Tylenol), topical muscle creams such as SalonPas, Icy Hot, Bengay, etc. Please stretch, apply heat, and have massage therapy for additional assistance. ° °

## 2019-09-02 NOTE — Telephone Encounter (Signed)
Spoke w/pt about scheduling MRI Brain.   Pt states "ER told me I need neck imaging too".  She has orthopedic appointment today and if they order neck imaging, she would like to do both done@same  time.  She will c/b to schedule her MRI if needed.

## 2019-09-02 NOTE — Telephone Encounter (Signed)
Noted  

## 2019-09-02 NOTE — ED Provider Notes (Signed)
[facility]-EMERGENCY DEPT Provider Note  CSN: [id number] Arrival date & time: 09/01/19 1857  Chief Complaint(s) Headache  HPI Martha Coleman is a 33 y.o. female who presents to the emergency department with 1 month of gradually worsening headache.  Headache is right-sided occipital radiating to the front.  Associated with right facial paresthesias.  No weakness.  Patient was seen by her PCP and treated with indomethacin which initially relieved her pain.  Several days ago the indomethacin was stopped working.  She denies any recent fevers or infections.  No cough and congestion.  Some nausea without emesis.  Patient now noticing neck pain.  Reports that her headache involved the left side of her head today.  Currently pain has improved and is only affecting the right side.  Pain is worse with movements of her neck and certain positions.  Patient is currently scheduled for an MRI by her PCP.  She was seen at urgent care earlier and instructed to present to the emergency department for further work-up and management.  HPI  Past Medical History Past Medical History:  Diagnosis Date  . Genital warts   . Normal pregnancy 06/12/2011  . SVD (spontaneous vaginal delivery) 10/12/2016   Patient Active Problem List   Diagnosis Date Noted  . Headache 08/13/2019  . Pain of breast 03/27/2018  . Abnormal cervical Papanicolaou smear 12/06/2017  . Irregular periods 12/06/2017  . Oligomenorrhea 12/06/2017  . BMI 27.0-27.9,adult 12/06/2017  . SVD (spontaneous vaginal delivery) 10/12/2016  . Carrier of group B Streptococcus 09/07/2016   Home Medication(s) Prior to Admission medications   Medication Sig Start Date End Date Taking? Authorizing Provider  indomethacin (INDOCIN) 25 MG capsule Take 1 capsule (25 mg total) by mouth 3 (three) times daily with meals. 08/26/19  Yes Eustaquio Boyden, MD  Levonorgestrel-Ethinyl Estrad (SRONYX PO) Take 1 tablet by mouth daily.   Yes  [provider]  loratadine (CLARITIN) 10 MG tablet Take 10 mg by mouth daily as needed for allergies.    Yes [provider]  spironolactone (ALDACTONE) 50 MG tablet Take 50 mg by mouth daily. with food 06/14/19  Yes [provider]                                                                                                                                    Past Surgical History Past Surgical History:  Procedure Laterality Date  . HYSTEROSCOPY    . NO PAST SURGERIES    . TONSILLECTOMY AND ADENOIDECTOMY    . WISDOM TOOTH EXTRACTION     Family History Family History  Problem Relation Age of Onset  . Clotting disorder Mother   . Diabetes Maternal Grandmother   . Hypertension Maternal Grandmother     Social History Social History   Tobacco Use  . Smoking status: Former Games developer  . Smokeless tobacco: Never Used  Substance Use Topics  . Alcohol use: Yes  Alcohol/week: 0.0 standard drinks    Comment: social   . Drug use: No   Allergies Doxycycline  Review of Systems Review of Systems All other systems are reviewed and are negative for acute change except as noted in the HPI  Physical Exam Vital Signs  I have reviewed the triage vital signs BP (!) 116/93   Pulse 65   Temp 98.3 F (36.8 C) (Oral)   Resp 16   Ht 5\' 4"  (1.626 m)   Wt 67.6 kg   LMP 08/31/2019   SpO2 98%   BMI 25.58 kg/m   Physical Exam Vitals signs reviewed.  Constitutional:      General: She is not in acute distress.    Appearance: She is well-developed. She is not diaphoretic.  HENT:     Head: Normocephalic and atraumatic.     Right Ear: External ear normal.     Left Ear: External ear normal.     Nose: Nose normal.  Eyes:     General: No scleral icterus.    Conjunctiva/sclera: Conjunctivae normal.     Pupils: Pupils are equal, round, and reactive to light.     Funduscopic exam:    Right eye: No papilledema. Venous pulsations present.        Left eye: No  papilledema. Venous pulsations present. Neck:     Musculoskeletal: Normal range of motion.     Trachea: Phonation normal.  Cardiovascular:     Rate and Rhythm: Normal rate and regular rhythm.  Pulmonary:     Effort: Pulmonary effort is normal. No respiratory distress.     Breath sounds: No stridor.  Abdominal:     General: There is no distension.  Musculoskeletal: Normal range of motion.  Neurological:     Mental Status: She is alert and oriented to person, place, and time.     Comments: Mental Status:  Alert and oriented to person, place, and time.  Attention and concentration normal.  Speech clear.  Recent memory is intact  Cranial Nerves:  II Visual Fields: Intact to confrontation. Visual fields intact. III, IV, VI: Pupils equal and reactive to light and near. Full eye movement without nystagmus  V Facial Sensation: Normal. No weakness of masticatory muscles  VII: No facial weakness or asymmetry  VIII Auditory Acuity: Grossly normal  IX/X: The uvula is midline; the palate elevates symmetrically  XI: Normal sternocleidomastoid and trapezius strength  XII: The tongue is midline. No atrophy or fasciculations.   Motor System: Muscle Strength: 5/5 and symmetric in the upper and lower extremities. No pronation or drift.  Muscle Tone: Tone and muscle bulk are normal in the upper and lower extremities.   Reflexes: DTRs: 1+ and symmetrical in all four extremities. No Clonus Coordination: Intact finger-to-nose. No tremor.  Sensation: Intact to light touch..  Gait: deferred   Psychiatric:        Behavior: Behavior normal.     ED Results and Treatments Labs (all labs ordered are listed, but only abnormal results are displayed) Labs Reviewed  CBC WITH DIFFERENTIAL/PLATELET - Abnormal; Notable for the following components:      Result Value   RBC 5.42 (*)    Hemoglobin 16.8 (*)    HCT 47.7 (*)    RDW 11.3 (*)    All other components within normal limits  COMPREHENSIVE  METABOLIC PANEL  I-STAT BETA HCG BLOOD, ED ([facility], [facility], [facility] ONLY)  EKG  EKG Interpretation  Date/Time:    Ventricular Rate:    PR Interval:    QRS Duration:   QT Interval:    QTC Calculation:   R Axis:     Text Interpretation:        Radiology No results found.  Pertinent labs & imaging results that were available during my care of the patient were reviewed by me and considered in my medical decision making (see chart for details).  Medications Ordered in ED Medications  prochlorperazine (COMPAZINE) injection 10 mg (10 mg Intramuscular Given 09/02/19 0411)  dexamethasone (DECADRON) tablet 10 mg (10 mg Oral Given 09/02/19 0410)                                                                                                                                    Procedures Procedures  (including critical care time)  Medical Decision Making / ED Course I have reviewed the nursing notes for this encounter and the patient's prior records (if available in EHR or on provided paperwork).   Renaye RakersBrittany L Costlow was evaluated in Emergency Department on 09/02/2019 for the symptoms described in the history of present illness. She was evaluated in the context of the global COVID-19 pandemic, which necessitated consideration that the patient might be at risk for infection with the SARS-CoV-2 virus that causes COVID-19. Institutional protocols and algorithms that pertain to the evaluation of patients at risk for COVID-19 are in a state of rapid change based on information released by regulatory bodies including the CDC and federal and state organizations. These policies and algorithms were followed during the patient's care in the ED.  Non focal neuro exam. No recent head trauma. No fever. Doubt meningitis. Doubt intracranial bleed. Doubt IIH. Low suspicion for VST.  No indication for  imaging.   Will treat with migraine cocktail and reevaluate.  The patient appears reasonably screened and/or stabilized for discharge and I doubt any other medical condition or other [facility] requiring further screening, evaluation, or treatment in the ED at this time prior to discharge.  The patient is safe for discharge with strict return precautions.        Final Clinical Impression(s) / ED Diagnoses Final diagnoses:  Bad headache     The patient appears reasonably screened and/or stabilized for discharge and I doubt any other medical condition or other [facility] requiring further screening, evaluation, or treatment in the ED at this time prior to discharge.  Disposition: Discharge  Condition: Good  I have discussed the results, Dx and Tx plan with the patient who expressed understanding and agree(s) with the plan. Discharge instructions discussed at great length. The patient was given strict return precautions who verbalized understanding of the instructions. No further questions at time of discharge.    ED Discharge Orders    None        Follow Up: Emi BelfastGessner, Deborah B, FNP [address]  [place] [address] [phone]  Schedule an appointment as soon as possible for a visit       This chart was dictated using voice recognition software.  Despite best efforts to proofread,  errors can occur which can change the documentation meaning.   Fatima Blank, MD 09/02/19 [phone]

## 2019-09-03 ENCOUNTER — Telehealth: Payer: Self-pay | Admitting: Neurology

## 2019-09-03 NOTE — Telephone Encounter (Signed)
This is an urgent referral from Dr. Renda Rolls for headache. Can we find a spot for her on my schedule this week? thanks

## 2019-09-07 NOTE — Telephone Encounter (Signed)
Dr. Ahern aware. 

## 2019-09-07 NOTE — Telephone Encounter (Signed)
Dr. Jaynee Eagles spoke with office manager. Pt will be coming here. Please schedule appt for this Wednesday at 3:30 pm (not 3:00). Arrival 15-30 minutes early.

## 2019-09-07 NOTE — Telephone Encounter (Signed)
Please hold actually. Pt already has an appt with Dr. Tomi Likens for 11/13. Dr. Jaynee Eagles.

## 2019-09-07 NOTE — Telephone Encounter (Signed)
Can you schedule patient for this wed at 3;00 pm?

## 2019-09-09 ENCOUNTER — Encounter: Payer: Self-pay | Admitting: Neurology

## 2019-09-09 ENCOUNTER — Ambulatory Visit: Payer: 59 | Admitting: Neurology

## 2019-09-09 ENCOUNTER — Other Ambulatory Visit: Payer: Self-pay

## 2019-09-09 VITALS — BP 141/98 | HR 78 | Temp 97.8°F | Ht 64.0 in | Wt 151.0 lb

## 2019-09-09 DIAGNOSIS — G8929 Other chronic pain: Secondary | ICD-10-CM

## 2019-09-09 DIAGNOSIS — G441 Vascular headache, not elsewhere classified: Secondary | ICD-10-CM

## 2019-09-09 DIAGNOSIS — R519 Headache, unspecified: Secondary | ICD-10-CM

## 2019-09-09 DIAGNOSIS — R51 Headache with orthostatic component, not elsewhere classified: Secondary | ICD-10-CM

## 2019-09-09 DIAGNOSIS — H4710 Unspecified papilledema: Secondary | ICD-10-CM | POA: Diagnosis not present

## 2019-09-09 DIAGNOSIS — H539 Unspecified visual disturbance: Secondary | ICD-10-CM

## 2019-09-09 NOTE — Progress Notes (Signed)
[facility]    Provider:  Dr Lucia GaskinsAhern Requesting Provider: Eustaquio BoydenGutierrez, Javier, MD Primary Care Provider:  Eustaquio BoydenGutierrez, Javier, MD  CC:  headaches  HPI:  Martha Coleman is a 33 y.o. female here as requested by Eustaquio BoydenGutierrez, Javier, MD for headaches. No significant PMHx. The last week of September she went on vacation and she had a headache when she got back. Brother had migraines. She has had migraines in the past but not often. The headache in September what pressure all over the head. She had the headache for 8 days so she had a corona test, she saw anotherr doctor and had a toradol shot and it didn't he. A week later she went back to her primary care because she started getting sharp pain on the right side above her brow and pressure, eye pain, all on the right side, behind the pain, pain and pressure, it got so bad her jaw and teeth hurts, can be severe, her face feels swollen, she also has had the same headache on the left. Severe, 10/10 pain. She went to urgent care and was sent for MRI.She went to the ED and waited 7 hours. Her neck started hurting, tightness. She is straining to hold her head up. Neck tightness and pain, she has pressure in the head, she finished prednisone. Headache is continuous. Her neck is tight.  She has vision changes, headaches wake her, worse supine. No other focal neurologic deficits, associated symptoms, inciting events or modifiable factors.  Reviewed notes, labs and imaging from outside physicians, which showed:   I reviewed notes from the emergency department she was just seen at the end of October just last month for gradually worsening headache.  Headache is right-sided occipital radiating to the front.  Associated with right facial paresthesias.  Indomethacin helped.  But then the indomethacin stopped working.  No recent fevers or infections no cough or congestion some nausea without emesis.  Patient now noticing neck pain.  Her headache involve the  left side of her head today.light can bother her. She can;t move it makes it worse.   CBC with elevated hgb/hct, cmp normal  Review of Systems: Patient complains of symptoms per HPI as well as the following symptoms: headache. Pertinent negatives and positives per HPI. All others negative.   Social History   Socioeconomic History  . Marital status: Married    Spouse name: Not on file  . Number of children: 2  . Years of education: Not on file  . Highest education level: Associate degree: academic program  Occupational History  . Not on file  Social Needs  . Financial resource strain: Not on file  . Food insecurity    Worry: Not on file    Inability: Not on file  . Transportation needs    Medical: Not on file    Non-medical: Not on file  Tobacco Use  . Smoking status: Former Smoker    Types: Cigarettes    Quit date: 2014    Years since quitting: 6.8  . Smokeless tobacco: Never Used  Substance and Sexual Activity  . Alcohol use: Yes    Alcohol/week: 0.0 standard drinks    Comment: social   . Drug use: No  . Sexual activity: Yes    Partners: Male  Lifestyle  . Physical activity    Days per week: Not on file    Minutes per session: Not on file  . Stress: Not on file  Relationships  . Social connections  Talks on phone: Not on file    Gets together: Not on file    Attends religious service: Not on file    Active member of club or organization: Not on file    Attends meetings of clubs or organizations: Not on file    Relationship status: Not on file  . Intimate partner violence    Fear of current or ex partner: Not on file    Emotionally abused: Not on file    Physically abused: Not on file    Forced sexual activity: Not on file  Other Topics Concern  . Not on file  Social History Narrative   Lives at home with family   Right handed   Caffeine: maybe 1 cup/day    Family History  Problem Relation Age of Onset  . Clotting disorder Mother   . Diabetes  Maternal Grandmother   . Hypertension Maternal Grandmother   . Migraines Brother     Past Medical History:  Diagnosis Date  . Genital warts   . Normal pregnancy 06/12/2011  . SVD (spontaneous vaginal delivery) 10/12/2016    Patient Active Problem List   Diagnosis Date Noted  . Headache 08/13/2019  . Pain of breast 03/27/2018  . Abnormal cervical Papanicolaou smear 12/06/2017  . Irregular periods 12/06/2017  . Oligomenorrhea 12/06/2017  . BMI 27.0-27.9,adult 12/06/2017  . SVD (spontaneous vaginal delivery) 10/12/2016  . Carrier of group B Streptococcus 09/07/2016    Past Surgical History:  Procedure Laterality Date  . HYSTEROSCOPY    . NO PAST SURGERIES    . TONSILLECTOMY AND ADENOIDECTOMY    . WISDOM TOOTH EXTRACTION      Current Outpatient Medications  Medication Sig Dispense Refill  . spironolactone (ALDACTONE) 50 MG tablet Take 50 mg by mouth daily. with food    . indomethacin (INDOCIN) 25 MG capsule Take 1 capsule (25 mg total) by mouth 3 (three) times daily with meals. (Patient not taking: Reported on 09/09/2019) 90 capsule 0  . Levonorgestrel-Ethinyl Estrad (SRONYX PO) Take 1 tablet by mouth daily.    Marland Kitchen loratadine (CLARITIN) 10 MG tablet Take 10 mg by mouth daily as needed for allergies.      No current facility-administered medications for this visit.     Allergies as of 09/09/2019 - Review Complete 09/09/2019  Allergen Reaction Noted  . Doxycycline Rash 11/24/2014    Vitals: BP (!) 141/98 (BP Location: Right Arm, Patient Position: Sitting)   Pulse 78   Temp 97.8 F (36.6 C) Comment: taken at front door  Ht 5\' 4"  (1.626 m)   Wt 151 lb (68.5 kg)   LMP 08/31/2019   Breastfeeding No   BMI 25.92 kg/m  Last Weight:  Wt Readings from Last 1 Encounters:  09/09/19 151 lb (68.5 kg)   Last Height:   Ht Readings from Last 1 Encounters:  09/09/19 5\' 4"  (1.626 m)     Physical exam: Exam: Gen: NAD, conversant, well nourised, well groomed                      CV: RRR, no MRG. No Carotid Bruits. No peripheral edema, warm, nontender Eyes: Conjunctivae clear without exudates or hemorrhage  Neuro: Detailed Neurologic Exam  Speech:    Speech is normal; fluent and spontaneous with normal comprehension.  Cognition:    The patient is oriented to person, place, and time;     recent and remote memory intact;     language fluent;  normal attention, concentration,     fund of knowledge Cranial Nerves:    The pupils are equal, round, and reactive to light. Slight blurring of right fundi, left normal.  Visual fields are full to finger confrontation. Extraocular movements are intact. Trigeminal sensation is intact and the muscles of mastication are normal. The face is symmetric. The palate elevates in the midline. Hearing intact. Voice is normal. Shoulder shrug is normal. The tongue has normal motion without fasciculations.   Coordination:    No dysmetria  Gait:    Normal native gait  Motor Observation:    No asymmetry, no atrophy, and no involuntary movements noted. Tone:    Normal muscle tone.    Posture:    Posture is normal. normal erect    Strength:    Strength is V/V in the upper and lower limbs.      Sensation: intact to LT     Reflex Exam:  DTR's:    Deep tendon reflexes in the upper and lower extremities are normal bilaterally.   Toes:    The toes are downgoing bilaterally.   Clonus:    Clonus is absent.    Assessment/Plan:  33 year old with intractable headache. Has migrainous features, probable migraine. However has concerning symptoms including vision changes, positional/nocturnal, intractable (ongong since October), optic nerve head blurring (edema?) needs MRI brain  IV infusion today for headache Her blood pressure is elevated 140s/100s this may be due to pain or anxiety but she needs to monitor closely as this may possibly be an etiology of headache and need to be treated in any case MRI brain w/wo contrast as above  Discussed propranolol if BP is elevated, may help with migraines and blood pressure   Orders Placed This Encounter  Procedures  . MR BRAIN W WO CONTRAST   No orders of the defined types were placed in this encounter.   Cc: Ria Bush, MD  Sarina Ill, MD  [facility] [address] [place], [place] [address]  Phone [phone] Fax [phone]

## 2019-09-10 ENCOUNTER — Encounter: Payer: Self-pay | Admitting: Family Medicine

## 2019-09-10 ENCOUNTER — Other Ambulatory Visit: Payer: Self-pay | Admitting: Neurology

## 2019-09-10 DIAGNOSIS — G8929 Other chronic pain: Secondary | ICD-10-CM

## 2019-09-10 DIAGNOSIS — Z8349 Family history of other endocrine, nutritional and metabolic diseases: Secondary | ICD-10-CM | POA: Insufficient documentation

## 2019-09-10 DIAGNOSIS — R519 Headache, unspecified: Secondary | ICD-10-CM

## 2019-09-10 MED ORDER — PROPRANOLOL HCL ER 80 MG PO CP24: 80.0000 mg | ORAL_CAPSULE | Freq: Every day | ORAL | 3 refills | Status: DC

## 2019-09-10 NOTE — Addendum Note (Signed)
Addended by: Ria Bush on: 09/10/2019 01:40 PM   Modules accepted: Orders

## 2019-09-10 NOTE — Telephone Encounter (Signed)
Pt seen by [facility], they will order MRI.

## 2019-09-10 NOTE — Addendum Note (Signed)
Addended by: Sarina Ill B on: 09/10/2019 01:22 PM   Modules accepted: Orders

## 2019-09-10 NOTE — Addendum Note (Signed)
Addended by: Ria Bush on: 09/10/2019 01:49 PM   Modules accepted: Orders

## 2019-09-11 ENCOUNTER — Telehealth: Payer: Self-pay

## 2019-09-11 ENCOUNTER — Other Ambulatory Visit (INDEPENDENT_AMBULATORY_CARE_PROVIDER_SITE_OTHER): Payer: 59

## 2019-09-11 DIAGNOSIS — Z8349 Family history of other endocrine, nutritional and metabolic diseases: Secondary | ICD-10-CM

## 2019-09-11 NOTE — Telephone Encounter (Signed)
Pt came in for labs and requested BP check.  Denies any sxs.  States she started BP med yesterday.  BP is 118/76 in the office today.  Fyi to Dr. Darnell Level.

## 2019-09-11 NOTE — Addendum Note (Signed)
Addended by: Ellamae Sia on: 09/11/2019 03:35 PM   Modules accepted: Orders

## 2019-09-11 NOTE — Telephone Encounter (Signed)
Noted. Thanks.

## 2019-09-14 ENCOUNTER — Telehealth: Payer: Self-pay | Admitting: Neurology

## 2019-09-14 NOTE — Telephone Encounter (Signed)
no to the covid questions MR Brain w/wo contrast Dr. Jaynee Eagles [facility] Auth: [id number] (Exp. 09/10/19 to 10/25/19). patient is scheduled at [facility] for 09/22/19.

## 2019-09-18 ENCOUNTER — Encounter

## 2019-09-18 ENCOUNTER — Ambulatory Visit: Payer: 59 | Admitting: Neurology

## 2019-09-18 ENCOUNTER — Other Ambulatory Visit: Payer: Self-pay | Admitting: Family Medicine

## 2019-09-18 LAB — TRANSFERRIN: Transferrin: 284 mg/dL (ref 188–341)

## 2019-09-18 LAB — IRON, TOTAL/TOTAL IRON BINDING CAP
%SAT: 40 % (calc) (ref 16–45)
Iron: 134 ug/dL (ref 40–190)
TIBC: 339 mcg/dL (calc) (ref 250–450)

## 2019-09-18 LAB — HEMOCHROMATOSIS DNA-PCR(C282Y,H63D)

## 2019-09-18 LAB — FERRITIN: Ferritin: 32 ng/mL (ref 16–154)

## 2019-09-18 NOTE — Telephone Encounter (Signed)
Indomethacin Last filled:  08/26/19, #90 Last OV:  08/26/19, acute HA Next OV:  none

## 2019-09-19 ENCOUNTER — Encounter: Payer: Self-pay | Admitting: Family Medicine

## 2019-09-19 DIAGNOSIS — Z148 Genetic carrier of other disease: Secondary | ICD-10-CM | POA: Insufficient documentation

## 2019-09-21 ENCOUNTER — Other Ambulatory Visit: Payer: Self-pay | Admitting: Neurology

## 2019-09-21 MED ORDER — ALPRAZOLAM 0.25 MG PO TABS: ORAL_TABLET | ORAL | 0 refills | Status: DC

## 2019-09-21 NOTE — Telephone Encounter (Signed)
Spoke with Martha Coleman relaying Dr. Synthia Innocent message.  Martha Coleman confirms she has stopped med.    Denied refill.

## 2019-09-21 NOTE — Telephone Encounter (Signed)
Plz call - if doing better with neuro treatment of headache, should be able to come off indocin.

## 2019-09-22 ENCOUNTER — Ambulatory Visit: Payer: 59

## 2019-09-22 ENCOUNTER — Other Ambulatory Visit: Payer: Self-pay

## 2019-09-22 DIAGNOSIS — H4710 Unspecified papilledema: Secondary | ICD-10-CM

## 2019-09-22 DIAGNOSIS — G441 Vascular headache, not elsewhere classified: Secondary | ICD-10-CM

## 2019-09-22 DIAGNOSIS — R51 Headache with orthostatic component, not elsewhere classified: Secondary | ICD-10-CM

## 2019-09-22 DIAGNOSIS — G8929 Other chronic pain: Secondary | ICD-10-CM

## 2019-09-22 DIAGNOSIS — H539 Unspecified visual disturbance: Secondary | ICD-10-CM

## 2019-09-22 DIAGNOSIS — R519 Headache, unspecified: Secondary | ICD-10-CM

## 2019-09-22 MED ORDER — GADOBENATE DIMEGLUMINE 529 MG/ML IV SOLN
15.0000 mL | Freq: Once | INTRAVENOUS | Status: AC | PRN
  Administered 2019-09-22: 09:00:00 15 mL via INTRAVENOUS

## 2019-10-08 ENCOUNTER — Telehealth: Payer: Self-pay | Admitting: *Deleted

## 2019-10-08 MED ORDER — AJOVY 225 MG/1.5ML ~~LOC~~ SOAJ: 225.0000 mg | SUBCUTANEOUS | 0 refills | Status: DC

## 2019-10-08 NOTE — Telephone Encounter (Signed)
Patient came by the office and was shown a demonstration of using the Ajovy autoinjector and also advised of the instructions and most common side effect of injection site reaction. Pt advised to call for any concerns, call 911 for emergencies. She understands to refrigerate the medication until use. Pt was given 3 sample injectors to use every 30 days. Pt aware per previous instruction by Dr. Jaynee Eagles that she can stop the propranolol 30 days after being on the [place]. She verbalized appreciation. Order was placed in the chart. Dr. Jaynee Eagles aware patient came by the office.

## 2019-11-06 DIAGNOSIS — U071 COVID-19: Secondary | ICD-10-CM

## 2019-11-06 HISTORY — DX: COVID-19: U07.1

## 2019-11-18 ENCOUNTER — Ambulatory Visit
Admission: EM | Admit: 2019-11-18 | Discharge: 2019-11-18 | Disposition: A | Discharge status: Home / Self Care | Payer: 59 | Attending: Physician Assistant | Admitting: Physician Assistant

## 2019-11-18 ENCOUNTER — Ambulatory Visit (INDEPENDENT_AMBULATORY_CARE_PROVIDER_SITE_OTHER): Payer: 59

## 2019-11-18 ENCOUNTER — Other Ambulatory Visit: Payer: Self-pay

## 2019-11-18 ENCOUNTER — Telehealth: Payer: Self-pay

## 2019-11-18 DIAGNOSIS — R0602 Shortness of breath: Secondary | ICD-10-CM

## 2019-11-18 DIAGNOSIS — U071 COVID-19: Secondary | ICD-10-CM

## 2019-11-18 DIAGNOSIS — R05 Cough: Secondary | ICD-10-CM

## 2019-11-18 DIAGNOSIS — R0789 Other chest pain: Secondary | ICD-10-CM | POA: Diagnosis not present

## 2019-11-18 MED ORDER — ALBUTEROL SULFATE HFA 108 (90 BASE) MCG/ACT IN AERS: 1.0000 | INHALATION_SPRAY | Freq: Four times a day (QID) | RESPIRATORY_TRACT | 0 refills | Status: DC | PRN

## 2019-11-18 MED ORDER — PREDNISONE 50 MG PO TABS: 50.0000 mg | ORAL_TABLET | Freq: Every day | ORAL | 0 refills | Status: DC

## 2019-11-18 MED ORDER — BENZONATATE 200 MG PO CAPS: 200.0000 mg | ORAL_CAPSULE | Freq: Three times a day (TID) | ORAL | 0 refills | Status: DC

## 2019-11-18 NOTE — ED Triage Notes (Signed)
Pt c/o cough, chest tightness and SOB since Monday. States tested COVID + today. Sent here from PCP for further evaluation. No distress noted, speaking in complete sentences.

## 2019-11-18 NOTE — Telephone Encounter (Signed)
When was first day of symptoms? Glad oxygen level is ok. If dyspnea, chest pain, reasonable to seek [facility] eval.  Please place on Covid call list to check on her again on Friday.

## 2019-11-18 NOTE — Telephone Encounter (Signed)
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 Tightness in chest. No fever or chills,head congestion,generalized weakness and H/A for days. SOB upon exertion; feels like hard to get a deep breath.lost of smell; runny nose,and fatigue. Pulse ox 100 %. No available respiratory clinic appts 11/18/19. Pt will go to [facility] on [address] for eval and possible xray. FYI to Dr Reece Agar. Who is out of office today and to Dr Milinda Antis who is in office.

## 2019-11-18 NOTE — Telephone Encounter (Signed)
Left message on vm per dpr relaying Dr. Timoteo Expose message.  Asked pt to call back with start date for sxs.  [Added pt to Pt Call Log.]

## 2019-11-18 NOTE — Discharge Instructions (Signed)
CXR negative. No alarming signs at this time. Albuterol as needed for shortness of breath. Prednisone for shortness of breath/cough. Tessalon for cough. You can discontinue quarantine after 10 days of symptom onset AND 24 hours of no fever with improvement of symptoms. If develop worsening chest pain, worsening shortness of breath, weakness, dizziness, go to the emergency department for further evaluation needed.

## 2019-11-18 NOTE — ED Provider Notes (Signed)
[facility] VIDEO VISITS    CSN: [id number] Arrival date & time: 11/18/19  1311      History   Chief Complaint Chief Complaint  Patient presents with  . Shortness of Breath  . COVID +    HPI CASIA CORTI is a 34 y.o. female.   34 year old female comes in for shortness of breath/chest tightness after being diagnosed with Covid today.  She has a 3-day history of chest tightness, cough, shortness of breath.  Has also had rhinorrhea, nasal congestion, sore throat.  Nausea without vomiting.  Has loss of smell.  Denies fever, chills, body aches.  Denies abdominal pain, diarrhea.  She has mid sternal chest pain that is pleuritic in nature, intermittent.  Has dyspnea on exertion, and sometimes with talking.  Denies birth control use, leg swelling/pain, personal history of blood clots.  Denies history of lung problems, asthma.  Former smoker for, 2 years of smoking with 1 pack/week.     Past Medical History:  Diagnosis Date  . COVID-19 virus infection 11/2019  . Genital warts   . Normal pregnancy 06/12/2011  . SVD (spontaneous vaginal delivery) 10/12/2016    Patient Active Problem List   Diagnosis Date Noted  . Carrier of hemochromatosis HFE gene mutation 09/19/2019  . Family history of hemochromatosis 09/10/2019  . Headache 08/13/2019  . Pain of breast 03/27/2018  . Abnormal cervical Papanicolaou smear 12/06/2017  . Irregular periods 12/06/2017  . Oligomenorrhea 12/06/2017  . BMI 27.0-27.9,adult 12/06/2017  . SVD (spontaneous vaginal delivery) 10/12/2016  . Carrier of group B Streptococcus 09/07/2016    Past Surgical History:  Procedure Laterality Date  . HYSTEROSCOPY    . NO PAST SURGERIES    . TONSILLECTOMY AND ADENOIDECTOMY    . WISDOM TOOTH EXTRACTION      OB History    Gravida  3   Para  2   Term  2   Preterm  0   AB  1   Living  2     SAB  1   TAB  0   Ectopic  0   Multiple  0   Live Births  2            Home Medications    Prior to  Admission medications   Medication Sig Start Date End Date Taking? Authorizing Provider  albuterol (VENTOLIN HFA) 108 (90 Base) MCG/ACT inhaler Inhale 1-2 puffs into the lungs every 6 (six) hours as needed for wheezing or shortness of breath. 11/18/19   Cathie Hoops, Jazleen Robeck V, PA-C  benzonatate (TESSALON) 200 MG capsule Take 1 capsule (200 mg total) by mouth every 8 (eight) hours. 11/18/19   Cathie Hoops, Madia Carvell V, PA-C  predniSONE (DELTASONE) 50 MG tablet Take 1 tablet (50 mg total) by mouth daily with breakfast. 11/18/19   Cathie Hoops, Corrina Steffensen V, PA-C  spironolactone (ALDACTONE) 50 MG tablet Take 50 mg by mouth daily. with food 06/14/19   [provider]    Family History Family History  Problem Relation Age of Onset  . Hemochromatosis Mother        homozygous for H63D mutation  . Diabetes Maternal Grandmother   . Hypertension Maternal Grandmother   . Migraines Brother     Social History Social History   Tobacco Use  . Smoking status: Former Smoker    Types: Cigarettes    Quit date: 2014    Years since quitting: 7.0  . Smokeless tobacco: Never Used  Substance Use Topics  . Alcohol use: Yes  Alcohol/week: 0.0 standard drinks    Comment: social   . Drug use: No     Allergies   Doxycycline   Review of Systems Review of Systems  Reason unable to perform ROS: See HPI as above.     Physical Exam Triage Vital Signs ED Triage Vitals [11/18/19 1328]  Enc Vitals Group     BP 135/86     Pulse Rate 84     Resp 18     Temp 98.9 F (37.2 C)     Temp Source Oral     SpO2 98 %     Weight      Height      Head Circumference      Peak Flow      Pain Score 7     Pain Loc      Pain Edu?      Excl. in GC?    No data found.  Updated Vital Signs BP 135/86 (BP Location: Left Arm)   Pulse 84   Temp 98.9 F (37.2 C) (Oral)   Resp 18   LMP 11/04/2019   SpO2 98%   Physical Exam Constitutional:      General: She is not in acute distress.    Appearance: Normal appearance. She is well-developed. She  is not ill-appearing, toxic-appearing or diaphoretic.  HENT:     Head: Normocephalic and atraumatic.     Mouth/Throat:     Mouth: Mucous membranes are moist.     Pharynx: Oropharynx is clear. Uvula midline.  Cardiovascular:     Rate and Rhythm: Normal rate and regular rhythm.     Heart sounds: Normal heart sounds. No murmur. No friction rub. No gallop.   Pulmonary:     Effort: Pulmonary effort is normal. No accessory muscle usage, prolonged expiration, respiratory distress or retractions.     Comments: Speaking in full sentences without difficulty. Lungs clear to auscultation without adventitious lung sounds. Chest:     Chest wall: No tenderness.  Abdominal:     General: Bowel sounds are normal.     Palpations: Abdomen is soft.     Tenderness: There is no abdominal tenderness. There is no guarding or rebound.  Musculoskeletal:     Cervical back: Normal range of motion and neck supple.     Right lower leg: No tenderness. No edema.     Left lower leg: No tenderness. No edema.  Skin:    General: Skin is warm and dry.  Neurological:     General: No focal deficit present.     Mental Status: She is alert and oriented to person, place, and time.      UC Treatments / Results  Labs (all labs ordered are listed, but only abnormal results are displayed) Labs Reviewed - No data to display  EKG   Radiology DG Chest 2 View  Result Date: 11/18/2019 CLINICAL DATA:  Pt c/o cough, chest tightness and SOB since Monday. States tested COVID + today. Former smoker. EXAM: CHEST - 2 VIEW COMPARISON:  None. FINDINGS: Normal heart, mediastinum and hila. Clear lungs.  No pleural effusion or pneumothorax. Skeletal structures are within normal limits. IMPRESSION: Normal chest radiographs. Electronically Signed   By: Amie Portland M.D.   On: 11/18/2019 14:05    Procedures Procedures (including critical care time)  Medications Ordered in UC Medications - No data to display  Initial Impression /  Assessment and Plan / UC Course  I have reviewed the triage vital signs and  the nursing notes.  Pertinent labs & imaging results that were available during my care of the patient were reviewed by me and considered in my medical decision making (see chart for details).    No alarming signs on exam.  Stable vitals.  Lungs clear to auscultation bilaterally without adventitious lung sounds.  Heart regular rate and rhythm, no rubs, murmurs, gallops.  Chest x-ray without active cardiopulmonary disease.  Will provide symptomatic treatment with close monitoring.  Strict return precautions given.  Patient expresses understanding and agrees to plan.  Final Clinical Impressions(s) / UC Diagnoses   Final diagnoses:  COVID-19  Shortness of breath  Midsternal chest pain   ED Prescriptions    Medication Sig Dispense Auth. Provider   albuterol (VENTOLIN HFA) 108 (90 Base) MCG/ACT inhaler Inhale 1-2 puffs into the lungs every 6 (six) hours as needed for wheezing or shortness of breath. 8 g Levora Werden V, PA-C   predniSONE (DELTASONE) 50 MG tablet Take 1 tablet (50 mg total) by mouth daily with breakfast. 5 tablet Tikita Mabee V, PA-C   benzonatate (TESSALON) 200 MG capsule Take 1 capsule (200 mg total) by mouth every 8 (eight) hours. 21 capsule Ok Edwards, PA-C     PDMP not reviewed this encounter.   Ok Edwards, PA-C 11/18/19 1448

## 2019-11-20 ENCOUNTER — Telehealth: Payer: Self-pay

## 2019-11-20 ENCOUNTER — Other Ambulatory Visit (INDEPENDENT_AMBULATORY_CARE_PROVIDER_SITE_OTHER): Payer: 59

## 2019-11-20 DIAGNOSIS — R300 Dysuria: Secondary | ICD-10-CM | POA: Diagnosis not present

## 2019-11-20 MED ORDER — SULFAMETHOXAZOLE-TRIMETHOPRIM 800-160 MG PO TABS: 1.0000 | ORAL_TABLET | Freq: Two times a day (BID) | ORAL | 0 refills | Status: DC

## 2019-11-20 NOTE — Telephone Encounter (Signed)
Noted. Unsure if she got our initial message.  Agree with [facility] eval if concern for kidney stone.

## 2019-11-20 NOTE — Telephone Encounter (Signed)
Spoke with pt to get update.  States the congestion is improving.  However, still has some chest tightness and some SOB.  Also, pt is now having urinary sxs.  C/o urinary urgency and having trouble urinating.  Also c/o RLQ and low mid abd pain and low right back pain.  H/o urinary problems.  Fyi to Dr. Reece Agar.

## 2019-11-20 NOTE — Telephone Encounter (Addendum)
Pt returning call.  Says she got my message and someone is on the way to drop off her urine sample.  States COVID sxs started 11/16/19.  Urinary sxs started yesterday.  Fyi to Dr. Reece Agar.

## 2019-11-20 NOTE — Telephone Encounter (Signed)
Left message on vm per dpr relaying Dr. Timoteo Expose message and asked pt to let us know when urinary sxs started.

## 2019-11-20 NOTE — Addendum Note (Signed)
Addended by: Eustaquio Boyden on: 11/20/2019 01:40 PM   Modules accepted: Orders

## 2019-11-20 NOTE — Telephone Encounter (Addendum)
When was first day of symptoms?  Recommend she collect urine sample in sterile container and have someone drop off this afternoon to run. If unable to have someone drop off, please collect sample and refrigerate then have someone drop off on Monday to send UA/ reflex to micro and culture if abnormal. I have sent abx course to her pharmacy but want her to collect sample prior to starting.

## 2019-11-20 NOTE — Telephone Encounter (Signed)
Pt was dx + covid recently and chest congestion and CP with tightness is better. Pt still has slight CP and tightness. Today pt started with burning and pain upon urination; pt has been pushing fluids but having problems urinating. Pt has rt lower back and abd pain; pt has hx of kidney stones also. Pt will go to [facility] on [address] either this evening or tomorrow. FYI to Dr Reece Agar.

## 2019-11-20 NOTE — Telephone Encounter (Signed)
Spoke with pt.  Confirms she received previous message.

## 2019-11-21 LAB — URINE CULTURE
MICRO NUMBER:: 10047126
Result:: NO GROWTH
SPECIMEN QUALITY:: ADEQUATE

## 2019-11-21 LAB — URINALYSIS, ROUTINE W REFLEX MICROSCOPIC
Bilirubin Urine: NEGATIVE
Glucose, UA: NEGATIVE
Hgb urine dipstick: NEGATIVE
Ketones, ur: NEGATIVE
Leukocytes,Ua: NEGATIVE
Nitrite: NEGATIVE
Protein, ur: NEGATIVE
Specific Gravity, Urine: 1.006 (ref 1.001–1.03)
pH: 6.5 (ref 5.0–8.0)

## 2019-11-24 ENCOUNTER — Ambulatory Visit: Payer: 59 | Admitting: Internal Medicine

## 2019-11-24 ENCOUNTER — Telehealth: Payer: Self-pay

## 2019-11-24 NOTE — Telephone Encounter (Signed)
Pt said was seen [facility] on [address] on 11/18/19 and had CXR and was given prednisone and inhaler. Pt tested positive last wk for covid; pt took last prednisone on 11/22/19. Starting on 11/23/19 began prod cough and not sure color of phlegm. Now sharp mid sternal pain has restarted on and off; pt has chest congestion and slight SOB with exertion but inhaler is helping the SOB. Pt has loss of taste and smell and runny nose. Pt request appt. Pt scheduled virtual appt on 11/25/19 with Dr Milinda Antis at 8;30 AM. UC and ED precautions given and pt voiced understanding. There were no available 30' appts for afternoon today and Dr Reece Agar is out of office until 11/27/19. FYI to Dr Milinda Antis

## 2019-11-24 NOTE — Telephone Encounter (Signed)
I will see her then  

## 2019-11-25 ENCOUNTER — Encounter: Payer: Self-pay | Admitting: Family Medicine

## 2019-11-25 ENCOUNTER — Other Ambulatory Visit: Payer: Self-pay

## 2019-11-25 ENCOUNTER — Ambulatory Visit (INDEPENDENT_AMBULATORY_CARE_PROVIDER_SITE_OTHER): Payer: 59 | Admitting: Family Medicine

## 2019-11-25 DIAGNOSIS — U071 COVID-19: Secondary | ICD-10-CM

## 2019-11-25 DIAGNOSIS — Z8616 Personal history of COVID-19: Secondary | ICD-10-CM

## 2019-11-25 HISTORY — DX: Personal history of COVID-19: Z86.16

## 2019-11-25 MED ORDER — PREDNISONE 10 MG PO TABS: ORAL_TABLET | ORAL | 0 refills | Status: DC

## 2019-11-25 NOTE — Patient Instructions (Addendum)
Take the prednisone as directed   Use the cough medicine if needed  Keep resting  Drink lots of fluids  Use your inhaler as needed   If worse or no improvement or any new symptoms give [place] a call

## 2019-11-25 NOTE — Assessment & Plan Note (Signed)
Struggling with some wheezing and sob after finishing 5 d of prednisone from UC  Rev her CXR  Reassuring encounter (not sob during interview)  Cautions given re: ER visit  Px prednisone 30 mg taper -rev poss side effects Continue symptomatic care inst to alert [place] if worse/new symptoms or no improvement

## 2019-11-25 NOTE — Progress Notes (Signed)
Virtual Visit via Video Note  I connected with Martha Coleman on 11/25/19 at  8:30 AM EST by a video enabled telemedicine application and verified that I am speaking with the correct person using two identifiers.  Location: Patient: home Provider: office    I discussed the limitations of evaluation and management by telemedicine and the availability of in person appointments. The patient expressed understanding and agreed to proceed.  Parties involved in encounter  Patient: Martha Coleman  Provider:  Loura Pardon MD    History of Present Illness: 34 yo pt of Dr Darnell Level presents with cough/congestion and chest discomfort   Tested positive last week for covid  Did CXR at urgent care  Given prednisone/inhaler and cough medicine   Was improving on the prednisone  (5d) Ended it on Sunday  Since then she is starting to get chest pain/tightness in the same area   Cough is productive - light color  At times she hears a little wheezing  Inhaler helps -but it makes her heart race  It makes her feel sob   Some headache  No fever  She lost her taste and smell   Had diarrhea for a few days   Symptoms started on the 11 th   No side effects from prednisone   Used to smoke for 2 years     Observations/Objective: Patient appears well, in no distress Weight is baseline  No facial swelling or asymmetry Normal voice-not hoarse and no slurred speech No obvious tremor or mobility impairment Moving neck and UEs normally Able to hear the call well  No cough or shortness of breath during interview  When asked to cough-sounds hacky/dry  On forced expiration- scant wheeze if any  Talkative and mentally sharp with no cognitive changes No skin changes on face or neck , no rash or pallor Affect is normal    Assessment and Plan: Problem List Items Addressed This Visit      Other   COVID-19    Struggling with some wheezing and sob after finishing 5 d of prednisone from UC  Rev her CXR   Reassuring encounter (not sob during interview)  Cautions given re: ER visit  Px prednisone 30 mg taper -rev poss side effects Continue symptomatic care inst to alert [place] if worse/new symptoms or no improvement          reassuring cxr   Patient Active Problem List   Diagnosis Date Noted  . COVID-19 11/25/2019  . Carrier of hemochromatosis HFE gene mutation 09/19/2019  . Family history of hemochromatosis 09/10/2019  . Headache 08/13/2019  . Pain of breast 03/27/2018  . Abnormal cervical Papanicolaou smear 12/06/2017  . Irregular periods 12/06/2017  . Oligomenorrhea 12/06/2017  . BMI 27.0-27.9,adult 12/06/2017  . SVD (spontaneous vaginal delivery) 10/12/2016  . Carrier of group B Streptococcus 09/07/2016   Past Medical History:  Diagnosis Date  . COVID-19 virus infection 11/2019  . Genital warts   . Normal pregnancy 06/12/2011  . SVD (spontaneous vaginal delivery) 10/12/2016   Past Surgical History:  Procedure Laterality Date  . HYSTEROSCOPY    . NO PAST SURGERIES    . TONSILLECTOMY AND ADENOIDECTOMY    . WISDOM TOOTH EXTRACTION     Social History   Tobacco Use  . Smoking status: Former Smoker    Types: Cigarettes    Quit date: 2014    Years since quitting: 7.0  . Smokeless tobacco: Never Used  Substance Use Topics  . Alcohol use: Yes  Alcohol/week: 0.0 standard drinks    Comment: social   . Drug use: No   Family History  Problem Relation Age of Onset  . Hemochromatosis Mother        homozygous for H63D mutation  . Diabetes Maternal Grandmother   . Hypertension Maternal Grandmother   . Migraines Brother    Allergies  Allergen Reactions  . Doxycycline Rash   Current Outpatient Medications on File Prior to Visit  Medication Sig Dispense Refill  . albuterol (VENTOLIN HFA) 108 (90 Base) MCG/ACT inhaler Inhale 1-2 puffs into the lungs every 6 (six) hours as needed for wheezing or shortness of breath. 8 g 0  . spironolactone (ALDACTONE) 50 MG tablet Take 50  mg by mouth daily. with food     No current facility-administered medications on file prior to visit.   Review of Systems  Constitutional: Positive for malaise/fatigue. Negative for chills and fever.  HENT: Negative for congestion, ear pain, sinus pain and sore throat.   Eyes: Negative for blurred vision, discharge and redness.  Respiratory: Positive for cough, sputum production, shortness of breath and wheezing. Negative for stridor.   Cardiovascular: Negative for chest pain, palpitations and leg swelling.  Gastrointestinal: Negative for abdominal pain, diarrhea, nausea and vomiting.  Musculoskeletal: Negative for myalgias.  Skin: Negative for rash.  Neurological: Positive for headaches. Negative for dizziness.    Follow Up Instructions: Take the prednisone as directed   Use the cough medicine if needed  Keep resting  Drink lots of fluids  Use your inhaler as needed   If worse or no improvement or any new symptoms give [place] a call    I discussed the assessment and treatment plan with the patient. The patient was provided an opportunity to ask questions and all were answered. The patient agreed with the plan and demonstrated an understanding of the instructions.   The patient was advised to call back or seek an in-person evaluation if the symptoms worsen or if the condition fails to improve as anticipated.     Roxy Manns, MD

## 2019-12-01 ENCOUNTER — Telehealth: Payer: Self-pay | Admitting: *Deleted

## 2019-12-01 ENCOUNTER — Ambulatory Visit (INDEPENDENT_AMBULATORY_CARE_PROVIDER_SITE_OTHER): Payer: 59 | Admitting: Family Medicine

## 2019-12-01 VITALS — BP 120/80 | HR 90 | Temp 98.1°F | Ht 64.0 in | Wt 156.8 lb

## 2019-12-01 DIAGNOSIS — R0981 Nasal congestion: Secondary | ICD-10-CM

## 2019-12-01 DIAGNOSIS — U071 COVID-19: Secondary | ICD-10-CM | POA: Diagnosis not present

## 2019-12-01 DIAGNOSIS — R079 Chest pain, unspecified: Secondary | ICD-10-CM

## 2019-12-01 MED ORDER — DULERA 100-5 MCG/ACT IN AERO: 2.0000 | INHALATION_SPRAY | Freq: Two times a day (BID) | RESPIRATORY_TRACT | 1 refills | Status: DC | PRN

## 2019-12-01 MED ORDER — FLOVENT HFA 110 MCG/ACT IN AERO: 2.0000 | INHALATION_SPRAY | Freq: Two times a day (BID) | RESPIRATORY_TRACT | 12 refills | Status: DC

## 2019-12-01 MED ORDER — CEFDINIR 300 MG PO CAPS: 600.0000 mg | ORAL_CAPSULE | Freq: Every day | ORAL | 0 refills | Status: DC

## 2019-12-01 NOTE — Patient Instructions (Signed)
Shortness of Breath, Adult Shortness of breath means you have trouble breathing. Shortness of breath could be a sign of a medical problem. Follow these instructions at home:   Watch for any changes in your symptoms.  Do not use any products that contain nicotine or tobacco, such as cigarettes, e-cigarettes, and chewing tobacco.  Do not smoke. Smoking can cause shortness of breath. If you need help to quit smoking, ask your doctor.  Avoid things that can make it harder to breathe, such as: ? Mold. ? Dust. ? Air pollution. ? Chemical smells. ? Things that can cause allergy symptoms (allergens), if you have allergies.  Keep your living space clean. Use products that help remove mold and dust.  Rest as needed. Slowly return to your normal activities.  Take over-the-counter and prescription medicines only as told by your doctor. This includes oxygen therapy and inhaled medicines.  Keep all follow-up visits as told by your doctor. This is important. Contact a doctor if:  Your condition does not get better as soon as expected.  You have a hard time doing your normal activities, even after you rest.  You have new symptoms. Get help right away if:  Your shortness of breath gets worse.  You have trouble breathing when you are resting.  You feel light-headed or you pass out (faint).  You have a cough that is not helped by medicines.  You cough up blood.  You have pain with breathing.  You have pain in your chest, arms, shoulders, or belly (abdomen).  You have a fever.  You cannot walk up stairs.  You cannot exercise the way you normally do. These symptoms may represent a serious problem that is an emergency. Do not wait to see if the symptoms will go away. Get medical help right away. Call your local emergency services (911 in the [place] Do not drive yourself to the hospital. Summary  Shortness of breath is when you have trouble breathing enough air. It can be a sign of a  medical problem.  Avoid things that make it hard for you to breathe, such as smoking, pollution, mold, and dust.  Watch for any changes in your symptoms. Contact your doctor if you do not get better or you get worse. This information is not intended to replace advice given to you by your health care provider. Make sure you discuss any questions you have with your health care provider. Document Revised: 03/24/2018 Document Reviewed: 03/24/2018 Elsevier Patient Education  2020 [employer] Inc[employer]  

## 2019-12-01 NOTE — Progress Notes (Signed)
Patient ID: Martha Coleman, female    DOB: 07-Sep-1986, 34 y.o.   MRN: [id number]  PCP: Ria Bush, MD  Chief Complaint  Patient presents with  . CHEST PAIN / SOB WITH JUST BEING UP / CAN'T BREATH LYING    Subjective:  HPI  Martha Coleman is a 34 y.o. female presents to [facility] for evaluation of symptoms related to COVID-19. Tested positive over 1 week ago. She continues to experience SOB, chest tightness, and cough which is occasionally productive. She has taken two courses of prednisone with temporary resolution of symptoms until the course is completed. She is a former smoker. She is afebrile today. Other symptoms include congestion and had initially lost smell. Tolerating food and fluid intake.  Reports mid-sternal chest pain which a similar pain is present in her upper mid back.  Review of Systems .Pertinent negatives listed in HPI  Patient Active Problem List   Diagnosis Date Noted  . COVID-19 11/25/2019  . Carrier of hemochromatosis HFE gene mutation 09/19/2019  . Family history of hemochromatosis 09/10/2019  . Headache 08/13/2019  . Pain of breast 03/27/2018  . Abnormal cervical Papanicolaou smear 12/06/2017  . Irregular periods 12/06/2017  . Oligomenorrhea 12/06/2017  . BMI 27.0-27.9,adult 12/06/2017  . SVD (spontaneous vaginal delivery) 10/12/2016  . Carrier of group B Streptococcus 09/07/2016      Prior to Admission medications   Medication Sig Start Date End Date Taking? Authorizing Provider  albuterol (VENTOLIN HFA) 108 (90 Base) MCG/ACT inhaler Inhale 1-2 puffs into the lungs every 6 (six) hours as needed for wheezing or shortness of breath. 11/18/19  Yes Yu, Amy V, PA-C  aspirin 81 MG chewable tablet Chew by mouth daily.   Yes [provider]  Multiple Minerals-Vitamins (CAL-MAG-ZINC-D) TABS Take 1 tablet by mouth daily.   Yes [provider]  predniSONE (DELTASONE) 10 MG tablet Take 3 pills once daily by mouth for 3  days, then 2 pills once daily for 3 days, then 1 pill once daily for 3 days and then stop 11/25/19  Yes Tower, Wynelle Fanny, MD  spironolactone (ALDACTONE) 50 MG tablet Take 50 mg by mouth daily. with food 06/14/19  Yes [provider]    Past Medical, Surgical Family and Social History reviewed and updated.    Objective:   Today's Vitals   12/01/19 1800  BP: 120/80  Pulse: 90  Temp: 98.1 F (36.7 C)  SpO2: 99%  Weight: 156 lb 12.8 oz (71.1 kg)  Height: 5\' 4"  (1.626 m)    Wt Readings from Last 3 Encounters:  12/01/19 156 lb 12.8 oz (71.1 kg)  11/25/19 150 lb (68 kg)  09/09/19 151 lb (68.5 kg)   Physical Exam Constitutional:      Appearance: She is ill-appearing.  HENT:     Nose: Congestion present.  Cardiovascular:     Rate and Rhythm: Normal rate and regular rhythm.     Pulses: Normal pulses.  Pulmonary:     Breath sounds: Decreased air movement and transmitted upper airway sounds present.  Lymphadenopathy:     Cervical: No cervical adenopathy.  Skin:    General: Skin is warm and dry.  Neurological:     General: No focal deficit present.     Mental Status: She is oriented to person, place, and time.  Psychiatric:        Mood and Affect: Mood normal.       Assessment & Plan:  1. Chest pain, unspecified type -  EKG 12-Lead, NSR, no ischemic changes or ST elevation or depression   2. Bronchitis related to COVID-19, stable with continues symptoms of bronchitis. Recently treated with 2 courses of prednisone, therefore opted against another course of prednisone. -Cefdinir 600 mg once daily x 10 days  -Will prescribe Flovent inhaler- (Dulera prescribed after locating an coupon).  -Continue Albuterol   3. Sinus congestion -Complete current course of prednisone. -Recommend OTC antihistamine if congestion persists.  Meds ordered this encounter  Medications  . DISCONTD: fluticasone (FLOVENT HFA) 110 MCG/ACT inhaler    Sig: Inhale 2 puffs into the lungs 2 (two)  times daily. Use daily while symptoms    Dispense:  1 Inhaler    Refill:  12  . cefdinir (OMNICEF) 300 MG capsule    Sig: Take 2 capsules (600 mg total) by mouth daily.    Dispense:  20 capsule    Refill:  0  . DISCONTD: mometasone-formoterol (DULERA) 100-5 MCG/ACT AERO    Sig: Inhale 2 puffs into the lungs 2 (two) times daily as needed for wheezing.    Dispense:  13 g    Refill:  1  . DISCONTD: fluticasone (FLOVENT HFA) 110 MCG/ACT inhaler    Sig: Inhale 2 puffs into the lungs 2 (two) times daily. Use daily while symptoms    Dispense:  1 Inhaler    Refill:  12  . budesonide (PULMICORT) 0.5 MG/2ML nebulizer solution    Sig: Take 2 mLs (0.5 mg total) by nebulization every 12 (twelve) hours as needed.    Dispense:  120 mL    Refill:  12     -The patient was given clear instructions to go to ER or return to medical center if symptoms do not improve, worsen or new problems develop. The patient verbalized understanding.     Joaquin Courts, FNP-C [facility], PRN Provider  [facility]. [place], [place]  Clinic Phone: [phone] Clinic Fax: [phone] Clinic Hours: 5:30 pm -7:30 pm (Monday-Friday)

## 2019-12-01 NOTE — Telephone Encounter (Signed)
Please get her set up with the respiratory clinic for a visit

## 2019-12-01 NOTE — Telephone Encounter (Signed)
Patient scheduled appointment for today at 6:00.

## 2019-12-01 NOTE — Telephone Encounter (Signed)
Patient called stating that she tested positive for covid on 11/17/19. Patient stated that she did a virtual visit with Dr. Milinda Antis on 11/25/19. Patient stated that she was put on a Prednisone taper dose. Patient stated that yesterday she dropped down to Prednisone 10 mg twice a day. Patient stated after making the change in the dose she started with chest pain in the middle of her chest more on the right side and in her back. Patient stated that she has sharp pains when breathing out. Patient stated that she is having more SOB and difficulty breathing. Patient stated that she has been using her inhaler which helps a little. Patient stated that she has a lot of muscle weakness, but no fever.Paitent stated that she has a cough, but not productive enough to get anything out. Advised patient that this note will be going back to Dr. Milinda Antis for her review and will be contacting her back about getting her into the Respiratory Clinic. ER precautions were given to patient and she verbalized understanding.

## 2019-12-02 ENCOUNTER — Telehealth: Payer: Self-pay

## 2019-12-02 MED ORDER — FLOVENT HFA 110 MCG/ACT IN AERO: 2.0000 | INHALATION_SPRAY | Freq: Two times a day (BID) | RESPIRATORY_TRACT | 12 refills | Status: DC

## 2019-12-02 MED ORDER — BUDESONIDE 0.5 MG/2ML IN SUSP: 0.5000 mg | Freq: Two times a day (BID) | RESPIRATORY_TRACT | 12 refills | Status: DC | PRN

## 2019-12-02 NOTE — Telephone Encounter (Signed)
Patient said the copay card would not work for [employer]. Patient can use [facility] for Pulmaicort. Please send Rx to [facility], [place]

## 2019-12-03 NOTE — Telephone Encounter (Signed)
I spoke with pt last night and she agreed to change the inhaler to Pulmicort nebulizer medication since this is the cheapest way. Patient did state that she had a nebulizer machine. Nothing further is needed.

## 2019-12-03 NOTE — Telephone Encounter (Signed)
Rx has been sent  

## 2019-12-10 ENCOUNTER — Telehealth: Payer: Self-pay

## 2019-12-10 ENCOUNTER — Other Ambulatory Visit: Payer: Self-pay

## 2019-12-10 ENCOUNTER — Encounter: Payer: Self-pay | Admitting: Internal Medicine

## 2019-12-10 ENCOUNTER — Ambulatory Visit: Payer: 59 | Admitting: Internal Medicine

## 2019-12-10 VITALS — BP 116/78 | HR 78 | Temp 97.8°F | Wt 153.0 lb

## 2019-12-10 DIAGNOSIS — R0789 Other chest pain: Secondary | ICD-10-CM

## 2019-12-10 DIAGNOSIS — M79601 Pain in right arm: Secondary | ICD-10-CM

## 2019-12-10 DIAGNOSIS — Z8616 Personal history of COVID-19: Secondary | ICD-10-CM | POA: Diagnosis not present

## 2019-12-10 DIAGNOSIS — U071 COVID-19: Secondary | ICD-10-CM

## 2019-12-10 DIAGNOSIS — R1011 Right upper quadrant pain: Secondary | ICD-10-CM | POA: Diagnosis not present

## 2019-12-10 DIAGNOSIS — R110 Nausea: Secondary | ICD-10-CM | POA: Diagnosis not present

## 2019-12-10 DIAGNOSIS — R42 Dizziness and giddiness: Secondary | ICD-10-CM

## 2019-12-10 NOTE — Telephone Encounter (Signed)
Ok thank you. I will contact pt when I discuss with Joaquin Courts tonight at the respiratory clinic.

## 2019-12-10 NOTE — Patient Instructions (Signed)

## 2019-12-10 NOTE — Telephone Encounter (Signed)
Will order a chest x-ray if negative will have request PCP order CT Chest. If chest pain worsens and or if pain is severe, she will need to be seen at the ER.

## 2019-12-10 NOTE — Telephone Encounter (Signed)
Received msg from front office that access nurse called that pt called c/o chest pain and dizziness. Disposition was go to ER but pt refused. Pt is also COVID positive.   Tried to contact pt but no answer, left VM.

## 2019-12-10 NOTE — Telephone Encounter (Signed)
States that she is trying to reach the Respiratory Clinic. States that she is was seen with them for having sharp chest pain. Felt great the last couple days. Today just hit hard. Chest pain is back in the same area. Just finished up prednisone. F/u on 12/16/2019. Said if it wasn't any better then a CT or x-ray would be done. Still doing everything that she was advised. Chest pain is going all around her breast area.  Sent her to [facility] Nurse Triage line. Also sending message to Rae Halsted with Respiratory Clinic.

## 2019-12-10 NOTE — Telephone Encounter (Signed)
Pt returned your call.  

## 2019-12-10 NOTE — Telephone Encounter (Signed)
Pt c/o of R chest/side/rib pain and lightheadedness. 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 Lightheadedness only happened once but no activity was tied to it. She was covid positive on 1/12. She reports she had chest/side/rib pain at that time but no lightheadedness. Pt denies SOB, fever, N/V/D, rash, fatigue, sore throat, HA, or runny/stuffy nose.  Pt was given prednisone and abx when she was diagnosed with covid and she reports it did help the chest/rib/side pain. She was given a nebulizer treatment when she was seen at the respiratory center last Tuesday. Pt was instructed by acces nurse she should go to the ER. She does not think that is necessary and is still working and does not plan on going home. Tried to get her in with another provider today since her PCP does not have any open apts. She requested to see PCP tomorrow. Placed pt on schedule. Advised if any change in symptoms or increase in severity of symptoms to contact this office or go to the ER. Pt verbalized understanding.

## 2019-12-10 NOTE — Telephone Encounter (Signed)
Pt also reported side/chest/rib pain started at work this morning and that she has felt great the last 2 days

## 2019-12-10 NOTE — Telephone Encounter (Signed)
Noted. Will see tomorrow. 

## 2019-12-10 NOTE — Progress Notes (Signed)
Subjective:    Patient ID: Martha Coleman, female    DOB: 02-16-1986, 34 y.o.   MRN: [id number]  HPI  Pt presents to the clinic today with midsternal chest pain, nasal congestion, nausea and lightheadedness. This has been intermittent since her diagnosis of Covid 19 on 11/16/19. She was seen in the ED for the same 11/18/19. Xray was normal. She was prescribed Albuterol, Prednisone and Tessalon Pearls. She was seen by Dr. Glori Bickers 11/25/19, repeated Prednisone taper. She was seen by the respiratory clinic 12/01/19. ECG was normal. They gave her a Budesonide neb, started her on Flovent and Cefdinir. Since that time she reports symptoms seemed to be improving. This morning, she had lightheadedness, nausea, RUQ pain that she felt like radiated into her right arm. She has never had gallbladder issues but is concerned Covid might be causing some of these issues. She denies reflux but did take Tums this morning with minimal relief.  Review of Systems  Past Medical History:  Diagnosis Date  . COVID-19 virus infection 11/2019  . Genital warts   . Normal pregnancy 06/12/2011  . SVD (spontaneous vaginal delivery) 10/12/2016    Current Outpatient Medications  Medication Sig Dispense Refill  . albuterol (VENTOLIN HFA) 108 (90 Base) MCG/ACT inhaler Inhale 1-2 puffs into the lungs every 6 (six) hours as needed for wheezing or shortness of breath. 8 g 0  . aspirin 81 MG chewable tablet Chew by mouth daily.    . budesonide (PULMICORT) 0.5 MG/2ML nebulizer solution Take 2 mLs (0.5 mg total) by nebulization every 12 (twelve) hours as needed. 120 mL 12  . cefdinir (OMNICEF) 300 MG capsule Take 2 capsules (600 mg total) by mouth daily. 20 capsule 0  . Multiple Minerals-Vitamins (CAL-MAG-ZINC-D) TABS Take 1 tablet by mouth daily.    . predniSONE (DELTASONE) 10 MG tablet Take 3 pills once daily by mouth for 3 days, then 2 pills once daily for 3 days, then 1 pill once daily for 3 days and then stop 18 tablet 0  .  spironolactone (ALDACTONE) 50 MG tablet Take 50 mg by mouth daily. with food     No current facility-administered medications for this visit.    Allergies  Allergen Reactions  . Doxycycline Rash    Family History  Problem Relation Age of Onset  . Hemochromatosis Mother        homozygous for H63D mutation  . Diabetes Maternal Grandmother   . Hypertension Maternal Grandmother   . Migraines Brother     Social History   Socioeconomic History  . Marital status: Married    Spouse name: Not on file  . Number of children: 2  . Years of education: Not on file  . Highest education level: Associate degree: academic program  Occupational History  . Not on file  Tobacco Use  . Smoking status: Former Smoker    Types: Cigarettes    Quit date: 2014    Years since quitting: 7.0  . Smokeless tobacco: Never Used  Substance and Sexual Activity  . Alcohol use: Yes    Alcohol/week: 0.0 standard drinks    Comment: social   . Drug use: No  . Sexual activity: Yes    Partners: Male  Other Topics Concern  . Not on file  Social History Narrative   Lives at home with family   Right handed   Caffeine: maybe 1 cup/day   Social Determinants of Health   Financial Resource Strain:   . Difficulty of Paying  Living Expenses: Not on file  Food Insecurity:   . Worried About Charity fundraiser in the Last Year: Not on file  . Ran Out of Food in the Last Year: Not on file  Transportation Needs:   . Lack of Transportation (Medical): Not on file  . Lack of Transportation (Non-Medical): Not on file  Physical Activity:   . Days of Exercise per Week: Not on file  . Minutes of Exercise per Session: Not on file  Stress:   . Feeling of Stress : Not on file  Social Connections:   . Frequency of Communication with Friends and Family: Not on file  . Frequency of Social Gatherings with Friends and Family: Not on file  . Attends Religious Services: Not on file  . Active Member of Clubs or  Organizations: Not on file  . Attends Archivist Meetings: Not on file  . Marital Status: Not on file  Intimate Partner Violence:   . Fear of Current or Ex-Partner: Not on file  . Emotionally Abused: Not on file  . Physically Abused: Not on file  . Sexually Abused: Not on file     Constitutional: Denies fever, malaise, fatigue, headache or abrupt weight changes.  HEENT: Denies eye pain, eye redness, ear pain, ringing in the ears, wax buildup, runny nose, nasal congestion, bloody nose, or sore throat. Respiratory: Denies difficulty breathing, shortness of breath, cough or sputum production.   Cardiovascular: Pt reports midsternal chest pain. Denies chest tightness, palpitations or swelling in the hands or feet.  Gastrointestinal: Pt reports RUQ pain, nausea. Denies abdominal pain, bloating, constipation, diarrhea or blood in the stool.  Musculoskeletal: Pt reports right arm pain. Denies decrease in range of motion, difficulty with gait,  or joint  swelling.  Skin: Denies redness, rashes, lesions or ulcercations.  Neurological: Pt reports lightheadedness. Denies difficulty with memory, difficulty with speech or problems with balance and coordination.    No other specific complaints in a complete review of systems (except as listed in HPI above).     Objective:   Physical Exam   BP 116/78   Pulse 78   Temp 97.8 F (36.6 C) (Temporal)   Wt 153 lb (69.4 kg)   SpO2 98%   BMI 26.26 kg/m    Wt Readings from Last 3 Encounters:  12/01/19 156 lb 12.8 oz (71.1 kg)  11/25/19 150 lb (68 kg)  09/09/19 151 lb (68.5 kg)    General: Appears her stated age, well developed, well nourished in NAD. Skin: Warm, dry and intact. No rashes noted. Cardiovascular: Normal rate and rhythm. S1,S2 noted.  No murmur, rubs or gallops noted. Pulmonary/Chest: Normal effort and positive vesicular breath sounds. No respiratory distress. No wheezes, rales or ronchi noted.  Abdomen: Soft and  nontender. Negative Murphy's sign. Normal bowel sounds. No distention or masses noted. Liver, spleen and kidneys non palpable. Neurological: Alert and oriented.    BMET    Component Value Date/Time   NA 140 09/02/2019 0215   K 3.6 09/02/2019 0215   CL 106 09/02/2019 0215   CO2 26 09/02/2019 0215   GLUCOSE 91 09/02/2019 0215   BUN 10 09/02/2019 0215   CREATININE 0.78 09/02/2019 0215   CALCIUM 9.4 09/02/2019 0215   GFRNONAA >60 09/02/2019 0215   GFRAA >60 09/02/2019 0215    Lipid Panel  No results found for: CHOL, TRIG, HDL, CHOLHDL, VLDL, LDLCALC  CBC    Component Value Date/Time   WBC 8.3 09/02/2019 0215  RBC 5.42 (H) 09/02/2019 0215   HGB 16.8 (H) 09/02/2019 0215   HCT 47.7 (H) 09/02/2019 0215   PLT 325 09/02/2019 0215   MCV 88.0 09/02/2019 0215   MCH 31.0 09/02/2019 0215   MCHC 35.2 09/02/2019 0215   RDW 11.3 (L) 09/02/2019 0215   LYMPHSABS 1.9 09/02/2019 0215   MONOABS 0.4 09/02/2019 0215   EOSABS 0.1 09/02/2019 0215   BASOSABS 0.1 09/02/2019 0215    Hgb A1C No results found for: HGBA1C      Assessment & Plan:   Midsternal Chest Pain, RUQ Pain, Right Arm Pain, Nausea, Lightheadedness s/p Covid 19:  No indication for repeat chest xray or ECG at this time Will check ESR, CRP, CBC, CMET and D dimer today Consider CT chest if D dimer markedly elevated Advised her we are seeing these symptoms intermittently in Covid pts and some are lasting weeks to months. Reassurance provided  Will follow up after labs, return precautions discussed  Webb Silversmith, NP This visit occurred during the SARS-CoV-2 public health emergency.  Safety protocols were in place, including screening questions prior to the visit, additional usage of staff PPE, and extensive cleaning of exam room while observing appropriate contact time as indicated for disinfecting solutions.

## 2019-12-11 ENCOUNTER — Ambulatory Visit: Payer: 59 | Admitting: Family Medicine

## 2019-12-11 LAB — CBC
HCT: 43.8 % (ref 36.0–46.0)
Hemoglobin: 15 g/dL (ref 12.0–15.0)
MCHC: 34.1 g/dL (ref 30.0–36.0)
MCV: 90.1 fl (ref 78.0–100.0)
Platelets: 286 10*3/uL (ref 150.0–400.0)
RBC: 4.86 Mil/uL (ref 3.87–5.11)
RDW: 12.5 % (ref 11.5–15.5)
WBC: 8 10*3/uL (ref 4.0–10.5)

## 2019-12-11 LAB — COMPREHENSIVE METABOLIC PANEL
ALT: 16 U/L (ref 0–35)
AST: 11 U/L (ref 0–37)
Albumin: 4.3 g/dL (ref 3.5–5.2)
Alkaline Phosphatase: 46 U/L (ref 39–117)
BUN: 15 mg/dL (ref 6–23)
CO2: 28 mEq/L (ref 19–32)
Calcium: 9.6 mg/dL (ref 8.4–10.5)
Chloride: 104 mEq/L (ref 96–112)
Creatinine, Ser: 1.01 mg/dL (ref 0.40–1.20)
GFR: 63.19 mL/min (ref >60.00)
Glucose, Bld: 113 mg/dL — ABNORMAL HIGH (ref 70–99)
Potassium: 4.2 mEq/L (ref 3.5–5.1)
Sodium: 139 mEq/L (ref 135–145)
Total Bilirubin: 0.6 mg/dL (ref 0.2–1.2)
Total Protein: 6.6 g/dL (ref 6.0–8.3)

## 2019-12-11 LAB — HIGH SENSITIVITY CRP: CRP, High Sensitivity: 1.58 mg/L (ref 0.000–5.000)

## 2019-12-11 LAB — SEDIMENTATION RATE: Sed Rate: 3 mm/hr (ref 0–20)

## 2019-12-11 LAB — D-DIMER, QUANTITATIVE: D-Dimer, Quant: 0.32 mcg/mL FEU (ref <0.50)

## 2019-12-11 NOTE — Telephone Encounter (Signed)
I spoke with pt, she is ok with going to get a chest xray. I placed the order and gave her the message from [place]. Pt agreed and nothing further is needed.

## 2019-12-16 ENCOUNTER — Ambulatory Visit: Payer: 59

## 2019-12-22 ENCOUNTER — Encounter: Payer: Self-pay | Admitting: Physician Assistant

## 2019-12-22 ENCOUNTER — Other Ambulatory Visit: Payer: Self-pay

## 2019-12-22 ENCOUNTER — Ambulatory Visit (INDEPENDENT_AMBULATORY_CARE_PROVIDER_SITE_OTHER): Payer: 59 | Admitting: Physician Assistant

## 2019-12-22 VITALS — BP 118/80 | HR 64 | Temp 97.6°F | Ht 64.0 in | Wt 155.0 lb

## 2019-12-22 DIAGNOSIS — R1013 Epigastric pain: Secondary | ICD-10-CM | POA: Diagnosis not present

## 2019-12-22 DIAGNOSIS — R1011 Right upper quadrant pain: Secondary | ICD-10-CM | POA: Diagnosis not present

## 2019-12-22 MED ORDER — PANTOPRAZOLE SODIUM 40 MG PO TBEC: 40.0000 mg | DELAYED_RELEASE_TABLET | ORAL | 1 refills | Status: DC

## 2019-12-22 NOTE — Patient Instructions (Signed)
If you are age 33 or older, your body mass index should be between 23-30. Your Body mass index is 26.61 kg/m. If this is out of the aforementioned range listed, please consider follow up with your Primary Care Provider.  If you are age 27 or younger, your body mass index should be between 19-25. Your Body mass index is 26.61 kg/m. If this is out of the aformentioned range listed, please consider follow up with your Primary Care Provider.   You have been scheduled for an abdominal ultrasound at [facility] (1st floor of hospital) on 12/28/19 at 9:30 am. Please arrive 15 minutes prior to your appointment for registration. Make certain not to have anything to eat or drink 6 hours prior to your appointment. Should you need to reschedule your appointment, please contact radiology at [phone]. This test typically takes about 30 minutes to perform.  We have sent the following medications to your pharmacy for you to pick up at your convenience: Protonix 40 mg  Call in two weeks and ask to speak with nurse if not feeling better.  Thank you for choosing me and [facility].   Amy Esterwood, PA-C

## 2019-12-22 NOTE — Progress Notes (Signed)
Subjective:    Patient ID: Martha Coleman, female    DOB: 1986-07-30, 34 y.o.   MRN: [id number]  HPI Martha Coleman is a pleasant 34 year old white female, new to GI today referred by primary care/[facility] with complaints of epigastric and lower chest pain. Patient is generally in good health, has family history of hemochromatosis.  Patient was diagnosed with COVID-19 on 11/18/2019 and was symptomatic with cough shortness of breath sore throat and chest tightness.  She was seen in the [facility] respiratory clinic on 12/01/2019 with persistent symptoms even after 2 courses of prednisone.  She was diagnosed with bronchitis and treated with inhalers and a course of cefdinir.  She was then seen again by primary care on 12/10/2019 with complaints of midsternal chest pain and some nausea.  It was felt that she was experiencing angering symptoms post Covid. S chest x-ray was done on 11/18/2019, no pneumonia.  Labs on 12/10/2019 with normal CBC, normal c-Met, and normal sed rate , CRP.,  And D-dimer. She says her current symptoms feel a little different than the symptoms she had initially with chest tightness associated with the cough and mild shortness of breath.  Her current discomfort is in the lower substernal area and wraps around the right upper quadrant into her back and into the right arm at times.  She describes it as a burning sensation that comes and goes.  She says it was worse about a week ago but is still persisting though less intense.  She does feel that symptoms may be aggravated by eating and generally feels a bit worse after lunch.  She is not having any nocturnal symptoms.  She has been able to get back to exercising and is not experiencing any shortness of breath or exertional symptoms currently.  She has had some intermittent nausea which comes and goes as well.  No dysphagia or odynophagia.  No prior history of GERD symptoms.  She has tried Tums without much benefit.  She has also been  having an increase in gas some bloating and belching.  Review of Systems Pertinent positive and negative review of systems were noted in the above HPI section.  All other review of systems was otherwise negative.  Outpatient Encounter Medications as of 12/22/2019  Medication Sig  . aspirin 81 MG chewable tablet Chew by mouth daily.  . Cholecalciferol (VITAMIN D3) 125 MCG (5000 UT) CAPS Take 1 capsule by mouth daily.  Marland Kitchen spironolactone (ALDACTONE) 50 MG tablet Take 50 mg by mouth daily. with food  . pantoprazole (PROTONIX) 40 MG tablet Take 1 tablet (40 mg total) by mouth every morning. Take one 30-60 minutes before breakfast.  . [DISCONTINUED] albuterol (VENTOLIN HFA) 108 (90 Base) MCG/ACT inhaler Inhale 1-2 puffs into the lungs every 6 (six) hours as needed for wheezing or shortness of breath.  . [DISCONTINUED] budesonide (PULMICORT) 0.5 MG/2ML nebulizer solution Take 2 mLs (0.5 mg total) by nebulization every 12 (twelve) hours as needed.  . [DISCONTINUED] cefdinir (OMNICEF) 300 MG capsule Take 2 capsules (600 mg total) by mouth daily.  . [DISCONTINUED] Multiple Minerals-Vitamins (CAL-MAG-ZINC-D) TABS Take 1 tablet by mouth daily.  . [DISCONTINUED] predniSONE (DELTASONE) 10 MG tablet Take 3 pills once daily by mouth for 3 days, then 2 pills once daily for 3 days, then 1 pill once daily for 3 days and then stop   No facility-administered encounter medications on file as of 12/22/2019.   Allergies  Allergen Reactions  . Doxycycline Rash  Patient Active Problem List   Diagnosis Date Noted  . COVID-19 11/25/2019  . Carrier of hemochromatosis HFE gene mutation 09/19/2019  . Family history of hemochromatosis 09/10/2019  . Headache 08/13/2019  . Pain of breast 03/27/2018  . Abnormal cervical Papanicolaou smear 12/06/2017  . Irregular periods 12/06/2017  . Oligomenorrhea 12/06/2017  . BMI 27.0-27.9,adult 12/06/2017  . SVD (spontaneous vaginal delivery) 10/12/2016  . Carrier of group B  Streptococcus 09/07/2016   Social History   Socioeconomic History  . Marital status: Married    Spouse name: Not on file  . Number of children: 2  . Years of education: Not on file  . Highest education level: Associate degree: academic program  Occupational History  . Not on file  Tobacco Use  . Smoking status: Former Smoker    Types: Cigarettes    Quit date: 2014    Years since quitting: 7.1  . Smokeless tobacco: Never Used  Substance and Sexual Activity  . Alcohol use: Yes    Alcohol/week: 0.0 standard drinks    Comment: social   . Drug use: No  . Sexual activity: Yes    Partners: Male  Other Topics Concern  . Not on file  Social History Narrative   Lives at home with family   Right handed   Caffeine: maybe 1 cup/day   Social Determinants of Health   Financial Resource Strain:   . Difficulty of Paying Living Expenses: Not on file  Food Insecurity:   . Worried About Charity fundraiser in the Last Year: Not on file  . Ran Out of Food in the Last Year: Not on file  Transportation Needs:   . Lack of Transportation (Medical): Not on file  . Lack of Transportation (Non-Medical): Not on file  Physical Activity:   . Days of Exercise per Week: Not on file  . Minutes of Exercise per Session: Not on file  Stress:   . Feeling of Stress : Not on file  Social Connections:   . Frequency of Communication with Friends and Family: Not on file  . Frequency of Social Gatherings with Friends and Family: Not on file  . Attends Religious Services: Not on file  . Active Member of Clubs or Organizations: Not on file  . Attends Archivist Meetings: Not on file  . Marital Status: Not on file  Intimate Partner Violence:   . Fear of Current or Ex-Partner: Not on file  . Emotionally Abused: Not on file  . Physically Abused: Not on file  . Sexually Abused: Not on file    Martha Coleman's family history includes Diabetes in her maternal grandmother; Hemochromatosis in her mother;  Hypertension in her maternal grandmother; Migraines in her brother.      Objective:    Vitals:   12/22/19 1534  BP: 118/80  Pulse: 64  Temp: 97.6 F (36.4 C)    Physical Exam Well-developed well-nourished young female in no acute distress.  Height, Weight 155, BMI 26.6  HEENT; nontraumatic normocephalic, EOMI, PE RR LA, sclera anicteric. Oropharynx; not examined Neck; supple, no JVD Cardiovascular; regular rate and rhythm with S1-S2, no murmur rub or gallop Pulmonary; Clear bilaterally-no chest wall tenderness or costal margin tenderness Abdomen; soft, mildly tender, epigastrium and right upper quadrant nondistended, no palpable mass or hepatosplenomegaly, bowel sounds are active Rectal; not done Skin; benign exam, no jaundice rash or appreciable lesions Extremities; no clubbing cyanosis or edema skin warm and dry Neuro/Psych; alert and oriented x4, grossly  nonfocal mood and affect appropriate       Assessment & Plan:   #55 34 year old female diagnosed with COVID-19 11/18/2019 with at least 3-week history of ongoing symptoms.  She required 2 courses of steroids, inhalers and a course of antibiotics for post Covid bronchitis. Now with lower substernal chest discomfort, epigastric discomfort and discomfort radiating around the right upper quadrant into the back.  Some intermittent nausea without vomiting, she has had an increase in gas and belching. Respiratory symptoms have all resolved.  I think this likely is GI related, she may have gastritis, possibly med related.  Consider gallbladder disease.  Recent labs reassuring  Plan; start Protonix 40 mg p.o. every morning x1 month Schedule for upper abdominal ultrasound. I have asked her to call back in 2 weeks if she has not had a significant improvement on PPI therapy. Patient will be established with Dr. Silverio Decamp and will follow up with me on an as-needed basis. Further recommendations pending ultrasound.   Cleston Lautner Genia Harold  PA-C 12/22/2019   Cc: Ria Bush, MD

## 2019-12-24 NOTE — Progress Notes (Signed)
Reviewed and agree with documentation and assessment and plan. K. Veena Aloni Chuang , MD   

## 2019-12-28 ENCOUNTER — Ambulatory Visit (HOSPITAL_COMMUNITY)
Admission: RE | Admit: 2019-12-28 | Discharge: 2019-12-28 | Disposition: A | Discharge status: Home / Self Care | Payer: 59 | Source: Ambulatory Visit | Attending: Physician Assistant | Admitting: Physician Assistant

## 2019-12-28 ENCOUNTER — Other Ambulatory Visit: Payer: Self-pay

## 2019-12-28 DIAGNOSIS — R1013 Epigastric pain: Secondary | ICD-10-CM

## 2019-12-28 DIAGNOSIS — R1011 Right upper quadrant pain: Secondary | ICD-10-CM | POA: Diagnosis present

## 2020-01-17 ENCOUNTER — Other Ambulatory Visit: Payer: Self-pay | Admitting: Physician Assistant

## 2020-01-25 ENCOUNTER — Other Ambulatory Visit: Payer: Self-pay | Admitting: Physician Assistant

## 2020-04-20 ENCOUNTER — Other Ambulatory Visit: Payer: Self-pay | Admitting: Obstetrics and Gynecology

## 2020-04-20 DIAGNOSIS — Z1231 Encounter for screening mammogram for malignant neoplasm of breast: Secondary | ICD-10-CM

## 2020-04-20 DIAGNOSIS — N63 Unspecified lump in unspecified breast: Secondary | ICD-10-CM

## 2020-05-06 ENCOUNTER — Other Ambulatory Visit: Payer: 59

## 2020-05-20 ENCOUNTER — Other Ambulatory Visit: Payer: Self-pay

## 2020-05-20 ENCOUNTER — Ambulatory Visit
Admission: RE | Admit: 2020-05-20 | Discharge: 2020-05-20 | Disposition: A | Discharge status: Home / Self Care | Payer: 59 | Source: Ambulatory Visit | Attending: Obstetrics and Gynecology | Admitting: Obstetrics and Gynecology

## 2020-05-20 DIAGNOSIS — N63 Unspecified lump in unspecified breast: Secondary | ICD-10-CM

## 2020-07-04 ENCOUNTER — Encounter: Payer: Self-pay | Admitting: Family Medicine

## 2020-07-04 NOTE — Telephone Encounter (Signed)
Patient left a voicemail regarding the mychart message that she sent earlier. Patient wants to know if she should see you or see a specialist. Patient stated that she is not sure if she should see a cardiologist or a gastroenterologist or start out with you. Please advise.

## 2020-07-04 NOTE — Telephone Encounter (Signed)
Spoke to patient and was advised that she kind of feels the way she did post covid back in January. Patient stated that she did a rapid home covid test Saturday and it was negative. Patient stated that her symptoms started about a week ago, Patient stated that she has had some SOB and weakness. Patient stated that she has not had the covid vaccines. Patient wants to know if she can have another rapid covid test at a facility and not a home test before coming into the office. Patient stated that she would like to come for an appointment prior to 07/15/20.   After speaking to Dr. Sharen Hones patient was advised that he that it will be okay to have another rapid covid test done not at home and by someone else. Patient stated that she will have a second covid test done and will let Dr. Sharen Hones know the results/

## 2020-07-05 ENCOUNTER — Encounter: Payer: Self-pay | Admitting: Family Medicine

## 2020-07-05 NOTE — Telephone Encounter (Addendum)
Per Rene Kocher, pt is calling to see if she can be seen by you before 07/15/20.  Or do you want to get her in with any provider before then?

## 2020-07-05 NOTE — Telephone Encounter (Signed)
Please place at 2:45pm tomorrow.  Thanks

## 2020-07-05 NOTE — Telephone Encounter (Signed)
Lvm asking pt to call back to r/s 07/15/20 OV to an earlier date.  Also, sent MyChart message.

## 2020-07-05 NOTE — Telephone Encounter (Signed)
Spoke with pt notifying her 07/15/20 is the earliest available appt with Dr. Reece Agar.  Pt verbalizes understanding and declines to see any other provider or go to UC or ER.  FYI to Dr. Reece Agar.

## 2020-07-05 NOTE — Telephone Encounter (Signed)
Added pt at 2:45.  Spoke with pt making her aware of OV tomorrow.  Verbalizes understanding and expresses her thanks.

## 2020-07-06 ENCOUNTER — Other Ambulatory Visit: Payer: Self-pay

## 2020-07-06 ENCOUNTER — Encounter: Payer: Self-pay | Admitting: Family Medicine

## 2020-07-06 ENCOUNTER — Ambulatory Visit (INDEPENDENT_AMBULATORY_CARE_PROVIDER_SITE_OTHER)
Admission: RE | Admit: 2020-07-06 | Discharge: 2020-07-06 | Disposition: A | Discharge status: Home / Self Care | Payer: 59 | Source: Ambulatory Visit | Attending: Family Medicine | Admitting: Family Medicine

## 2020-07-06 ENCOUNTER — Ambulatory Visit: Payer: 59 | Admitting: Family Medicine

## 2020-07-06 VITALS — BP 124/82 | HR 89 | Temp 98.0°F | Ht 64.0 in | Wt 156.9 lb

## 2020-07-06 DIAGNOSIS — R079 Chest pain, unspecified: Secondary | ICD-10-CM

## 2020-07-06 DIAGNOSIS — R1013 Epigastric pain: Secondary | ICD-10-CM

## 2020-07-06 DIAGNOSIS — Z8616 Personal history of COVID-19: Secondary | ICD-10-CM | POA: Diagnosis not present

## 2020-07-06 LAB — POC URINALSYSI DIPSTICK (AUTOMATED)
Bilirubin, UA: NEGATIVE
Blood, UA: NEGATIVE
Glucose, UA: NEGATIVE
Ketones, UA: NEGATIVE
Leukocytes, UA: NEGATIVE
Nitrite, UA: NEGATIVE
Protein, UA: NEGATIVE
Spec Grav, UA: 1.015 (ref 1.010–1.025)
Urobilinogen, UA: 0.2 E.U./dL
pH, UA: 6 (ref 5.0–8.0)

## 2020-07-06 NOTE — Progress Notes (Signed)
This visit was conducted in person.  BP 124/82   Pulse 89   Temp 98 F (36.7 C) (Temporal)   Ht 5\' 4"  (1.626 m)   Wt 156 lb 14.4 oz (71.2 kg)   LMP 06/22/2020   SpO2 99%   BMI 26.93 kg/m    CC: chest pain  Subjective:    Patient ID: [id number], female    DOB: 1986-01-13, 34 y.o.   MRN: [id number]  HPI: Martha Coleman is a 34 y.o. female presenting on 07/06/2020 for Chest Pain   1+ wk h/o constant sharp substernal chest pain predominantly located to R chest described as burning, achey throbbing, at times sharp, at times pressure - with radiation to back and R shoulder blade and band like distribution along right side of chest/abdomen. Occasional dyspnea due to pain. Pain can be reproducible to touch. Friday night acute worsening. Some bloating. Felt dizzy on Saturday with mild paresthesias. No better leaning forward. Increased fatigue/weakness. Worse when first lays down supine then improves in her sleep, then returns shortly after she awakens. She did start lansoprazole OTC over weekend. Tried aspirin without benefit. Tried tums as well. Some lower back pain, mild dysuria.  LMP 06/22/2020.   Denies fevers/chills, palpitations, congestion, cough, no significant GERD or belching or indigestion. No loss of taste or smell. No nausea/vomiting, diarrhea, bowel changes or lower abd pain.  No recent exercise in 2 weeks.  Recently restarted vitamin C and D (has stopped since).  Overall eats healthy - cut out red meat.   COVID diagnosis 11/2019 - symptoms started with chest pain. Prolonged symptoms of midsternal chest pain, nasal congestion, nausea, lightheadedness treated with albuterol, prednisone, tessalon, rpt prednisone taper. Saw respiratory clinic 12/01/2019 - EKG was normal, treated with flovent and cefdinir with improvement.  Had residual chest and abdomen discomfort saw [facility] ?gastritis treated with protonix 40mg  daily x 1 mo with benefit. Abd 12/03/2019 returned normal.   Occasional  substernal sharp chest.   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      Relevant past medical, surgical, family and social history reviewed and updated as indicated. Interim medical history since our last visit reviewed. Allergies and medications reviewed and updated. Outpatient Medications Prior to Visit  Medication Sig Dispense Refill  . Cetirizine HCl (ZYRTEC ALLERGY PO) Take by mouth.    . spironolactone (ALDACTONE) 50 MG tablet Take 50 mg by mouth daily. with food    . LANSOPRAZOLE PO Take by mouth.    [place] aspirin 81 MG chewable tablet Chew by mouth daily. (Patient not taking: Reported on 07/06/2020)    . Cholecalciferol (VITAMIN D3) 125 MCG (5000 UT) CAPS Take 1 capsule by mouth daily. (Patient not taking: Reported on 07/06/2020)    . pantoprazole (PROTONIX) 40 MG tablet TAKE 1 TABLET (40 MG TOTAL) BY MOUTH EVERY MORNING. TAKE ONE 30-60 MINUTES BEFORE BREAKFAST. (Patient not taking: Reported on 07/06/2020) 90 tablet 1   No facility-administered medications prior to visit.     Per HPI unless specifically indicated in ROS section below Review of Systems Objective:  BP 124/82   Pulse 89   Temp 98 F (36.7 C) (Temporal)   Ht 5\' 4"  (1.626 m)   Wt 156 lb 14.4 oz (71.2 kg)   LMP 06/22/2020   SpO2 99%   BMI 26.93 kg/m   Wt Readings from Last 3 Encounters:  07/06/20 156 lb 14.4 oz (71.2 kg)  12/22/19 155 lb (70.3 kg)  12/10/19 153 lb (69.4 kg)      Physical Exam Vitals and nursing note reviewed.  Constitutional:      Appearance: Normal appearance. She is not ill-appearing.  Eyes:     Extraocular Movements: Extraocular movements intact.     Conjunctiva/sclera: Conjunctivae normal.     Pupils: Pupils are equal, round, and reactive to light.  Neck:     Thyroid: No thyroid mass or thyroid tenderness.     Vascular: No carotid bruit.  Cardiovascular:     Rate and Rhythm: Normal rate and regular rhythm.     Pulses:  Normal pulses.     Heart sounds: Normal heart sounds. No murmur heard.   Pulmonary:     Effort: Pulmonary effort is normal. No respiratory distress.     Breath sounds: Normal breath sounds. No wheezing, rhonchi or rales.     Comments: Mild reproducible pain to palpation midline sternum without significant pain at costochondral junction, discomfort at xyphoid process and at abdomen deep to this. Pain is not relieved with leaning forward  Chest:     Chest wall: Tenderness present.  Abdominal:     General: Abdomen is flat. Bowel sounds are normal. There is no distension.     Palpations: Abdomen is soft. There is no mass.     Tenderness: There is abdominal tenderness in the right lower quadrant, epigastric area, periumbilical area and left lower quadrant. There is no right CVA tenderness, left CVA tenderness, guarding or rebound. Negative signs include Murphy's sign.     Hernia: No hernia is present.  Musculoskeletal:     Cervical back: Normal range of motion and neck supple.     Right lower leg: No edema.     Left lower leg: No edema.  Lymphadenopathy:     Cervical: No cervical adenopathy.  Skin:    General: Skin is warm and dry.     Findings: No rash.  Neurological:     Mental Status: She is alert.  Psychiatric:        Mood and Affect: Mood normal.        Behavior: Behavior normal.       Results for orders placed or performed in visit on 07/06/20  Comprehensive metabolic panel  Result Value Ref Range   Sodium 138 135 - 145 mEq/L   Potassium 3.7 3.5 - 5.1 mEq/L   Chloride 101 96 - 112 mEq/L   CO2 29 19 - 32 mEq/L   Glucose, Bld 96 70 - 99 mg/dL   BUN 18 6 - 23 mg/dL   Creatinine, Ser 4.16 0.40 - 1.20 mg/dL   Total Bilirubin 0.6 0.2 - 1.2 mg/dL   Alkaline Phosphatase 41 39 - 117 U/L   AST 15 0 - 37 U/L   ALT 17 0 - 35 U/L   Total Protein 6.8 6.0 - 8.3 g/dL   Albumin 4.6 3.5 - 5.2 g/dL   GFR 60.63 >01.60 mL/min   Calcium 9.4 8.4 - 10.5 mg/dL  TSH  Result Value Ref Range     TSH 2.99 0.35 - 4.50 uIU/mL  CBC with Differential/Platelet  Result Value Ref Range   WBC 8.3 4.0 - 10.5 K/uL   RBC 4.70 3.87 - 5.11 Mil/uL   Hemoglobin 14.7 12.0 - 15.0 g/dL   HCT 10.9 36 - 46 %   MCV 89.3 78.0 - 100.0 fl   MCHC 35.0 30.0 - 36.0 g/dL   RDW 32.3 55.7 - 32.2 %  Platelets 252.0 150 - 400 K/uL   Neutrophils Relative % 71.0 43 - 77 %   Lymphocytes Relative 21.0 12 - 46 %   Monocytes Relative 4.2 3 - 12 %   Eosinophils Relative 3.1 0 - 5 %   Basophils Relative 0.7 0 - 3 %   Neutro Abs 5.9 1.4 - 7.7 K/uL   Lymphs Abs 1.7 0.7 - 4.0 K/uL   Monocytes Absolute 0.3 0 - 1 K/uL   Eosinophils Absolute 0.3 0 - 0 K/uL   Basophils Absolute 0.1 0 - 0 K/uL  D-dimer, quantitative (not at [facility])  Result Value Ref Range   D-Dimer, Quant 0.42 <0.50 mcg/mL FEU  Vitamin B12  Result Value Ref Range   Vitamin B-12 555 211 - 911 pg/mL  Lipase  Result Value Ref Range   Lipase 24.0 11 - 59 U/L  Sedimentation rate  Result Value Ref Range   Sed Rate 2 0 - 20 mm/hr  POCT Urinalysis Dipstick (Automated)  Result Value Ref Range   Color, UA light yellow    Clarity, UA clear    Glucose, UA Negative Negative   Bilirubin, UA negative    Ketones, UA negative    Spec Grav, UA 1.015 1.010 - 1.025   Blood, UA negative    pH, UA 6.0 5.0 - 8.0   Protein, UA Negative Negative   Urobilinogen, UA 0.2 0.2 or 1.0 E.U./dL   Nitrite, UA negative    Leukocytes, UA Negative Negative   EKG - NSR 60-70 with sinus arrhythmia, normal axis, intervals, no acute ST/T changes Assessment & Plan:  This visit occurred during the SARS-CoV-2 public health emergency.  Safety protocols were in place, including screening questions prior to the visit, additional usage of staff PPE, and extensive cleaning of exam room while observing appropriate contact time as indicated for disinfecting solutions.   Problem List Items Addressed This Visit    History of COVID-19    Some symptoms similar to prior COVID illness  11/2019 - however she has taken 2 antigen tests this week, both negative.       Epigastric pain    Prolonged chest/epigastric symptoms after COVID-19 infection earlier this year, saw pulm and GI with normal abd US (no gallstones) and treated with PPI - symptoms at that time did resolve. Now with recurrence of pain, with unclear etiology. Further eval today with labs (check lipase), EKG and CXR - largely normal. Will continue PPI for now, consider increasing dose, consider return to GI.       Chest pain - Primary    Reproducible pain to mid sternum but predominantly epigastric - see below. Unclear etiology. Further evaluate with EKG (eval for myocarditis, pericarditis), CXR (eval for PNA or other lung process, r/o widening of mediastium) and labwork (include inflammatory markers and D dimer).       Relevant Orders   Comprehensive metabolic panel (Completed)   TSH (Completed)   CBC with Differential/Platelet (Completed)   D-dimer, quantitative (not at [facility]) (Completed)   Vitamin B12 (Completed)   Lipase (Completed)   Sedimentation rate (Completed)   DG Chest 2 View (Completed)   EKG 12-Lead (Completed)   POCT Urinalysis Dipstick (Automated) (Completed)       Meds ordered this encounter  Medications  . lansoprazole (PREVACID) 15 MG capsule    Sig: Take 1 capsule (15 mg total) by mouth daily at 12 noon.   Orders Placed This Encounter  Procedures  . DG Chest 2 View  Standing Status:   Future    Number of Occurrences:   1    Standing Expiration Date:   07/06/2021    Order Specific Question:   Reason for Exam (SYMPTOM  OR DIAGNOSIS REQUIRED)    Answer:   chest pain    Order Specific Question:   Is patient pregnant?    Answer:   No    Order Specific Question:   Preferred imaging location?    Answer:   [facility]    Order Specific Question:   Radiology Contrast Protocol - do NOT remove file path    Answer:    [email or web address]  . Comprehensive metabolic panel  . TSH  . CBC with Differential/Platelet  . D-dimer, quantitative (not at [facility])  . Vitamin B12  . Lipase  . Sedimentation rate  . POCT Urinalysis Dipstick (Automated)  . EKG 12-Lead    Patient Instructions  Urinalysis today.  Labs today.  Chest xray today  EKG today  Continue lansoprazole for now.   Follow up plan: Return if symptoms worsen or fail to improve.  Eustaquio Boyden, MD

## 2020-07-06 NOTE — Patient Instructions (Addendum)
Urinalysis today.  Labs today.  Chest xray today  EKG today  Continue lansoprazole for now.

## 2020-07-06 NOTE — Progress Notes (Signed)
Onset of chest pain last Tuesday. Patient states the pain is mainly on the left side of chest. Is a sharp/burning pain in the chest that radiates around to the back, in between the shoulder pain.   Pain is rated 7/10. Some shortness of breath with pain.

## 2020-07-07 LAB — COMPREHENSIVE METABOLIC PANEL
ALT: 17 U/L (ref 0–35)
AST: 15 U/L (ref 0–37)
Albumin: 4.6 g/dL (ref 3.5–5.2)
Alkaline Phosphatase: 41 U/L (ref 39–117)
BUN: 18 mg/dL (ref 6–23)
CO2: 29 mEq/L (ref 19–32)
Calcium: 9.4 mg/dL (ref 8.4–10.5)
Chloride: 101 mEq/L (ref 96–112)
Creatinine, Ser: 0.86 mg/dL (ref 0.40–1.20)
GFR: 75.8 mL/min (ref >60.00)
Glucose, Bld: 96 mg/dL (ref 70–99)
Potassium: 3.7 mEq/L (ref 3.5–5.1)
Sodium: 138 mEq/L (ref 135–145)
Total Bilirubin: 0.6 mg/dL (ref 0.2–1.2)
Total Protein: 6.8 g/dL (ref 6.0–8.3)

## 2020-07-07 LAB — CBC WITH DIFFERENTIAL/PLATELET
Basophils Absolute: 0.1 10*3/uL (ref 0.0–0.1)
Basophils Relative: 0.7 % (ref 0.0–3.0)
Eosinophils Absolute: 0.3 10*3/uL (ref 0.0–0.7)
Eosinophils Relative: 3.1 % (ref 0.0–5.0)
HCT: 42 % (ref 36.0–46.0)
Hemoglobin: 14.7 g/dL (ref 12.0–15.0)
Lymphocytes Relative: 21 % (ref 12.0–46.0)
Lymphs Abs: 1.7 10*3/uL (ref 0.7–4.0)
MCHC: 35 g/dL (ref 30.0–36.0)
MCV: 89.3 fl (ref 78.0–100.0)
Monocytes Absolute: 0.3 10*3/uL (ref 0.1–1.0)
Monocytes Relative: 4.2 % (ref 3.0–12.0)
Neutro Abs: 5.9 10*3/uL (ref 1.4–7.7)
Neutrophils Relative %: 71 % (ref 43.0–77.0)
Platelets: 252 10*3/uL (ref 150.0–400.0)
RBC: 4.7 Mil/uL (ref 3.87–5.11)
RDW: 12.3 % (ref 11.5–15.5)
WBC: 8.3 10*3/uL (ref 4.0–10.5)

## 2020-07-07 LAB — LIPASE: Lipase: 24 U/L (ref 11.0–59.0)

## 2020-07-07 LAB — VITAMIN B12: Vitamin B-12: 555 pg/mL (ref 211–911)

## 2020-07-07 LAB — D-DIMER, QUANTITATIVE: D-Dimer, Quant: 0.42 mcg/mL FEU (ref <0.50)

## 2020-07-07 LAB — SEDIMENTATION RATE: Sed Rate: 2 mm/hr (ref 0–20)

## 2020-07-07 LAB — TSH: TSH: 2.99 u[IU]/mL (ref 0.35–4.50)

## 2020-07-08 ENCOUNTER — Encounter: Payer: Self-pay | Admitting: Family Medicine

## 2020-07-08 DIAGNOSIS — R1013 Epigastric pain: Secondary | ICD-10-CM

## 2020-07-08 DIAGNOSIS — R1084 Generalized abdominal pain: Secondary | ICD-10-CM

## 2020-07-09 ENCOUNTER — Encounter: Payer: Self-pay | Admitting: Family Medicine

## 2020-07-09 DIAGNOSIS — R1013 Epigastric pain: Secondary | ICD-10-CM | POA: Insufficient documentation

## 2020-07-09 DIAGNOSIS — R079 Chest pain, unspecified: Secondary | ICD-10-CM | POA: Insufficient documentation

## 2020-07-09 HISTORY — DX: Chest pain, unspecified: R07.9

## 2020-07-09 MED ORDER — LANSOPRAZOLE 15 MG PO CPDR: 15.0000 mg | DELAYED_RELEASE_CAPSULE | Freq: Every day | ORAL | Status: DC

## 2020-07-09 NOTE — Telephone Encounter (Signed)
Ordering abd [place] for ongoing abd pain

## 2020-07-09 NOTE — Assessment & Plan Note (Addendum)
Reproducible pain to mid sternum but predominantly epigastric - see below. Unclear etiology. Further evaluate with EKG (eval for myocarditis, pericarditis), CXR (eval for PNA or other lung process, r/o widening of mediastium) and labwork (include inflammatory markers and D dimer).

## 2020-07-09 NOTE — Assessment & Plan Note (Signed)
Some symptoms similar to prior COVID illness 11/2019 - however she has taken 2 antigen tests this week, both negative.

## 2020-07-09 NOTE — Assessment & Plan Note (Addendum)
Prolonged chest/epigastric symptoms after COVID-19 infection earlier this year, saw pulm and GI with normal abd [place] (no gallstones) and treated with PPI - symptoms at that time did resolve. Now with recurrence of pain, with unclear etiology. Further eval today with labs (check lipase), EKG and CXR - largely normal. Will continue PPI for now, consider increasing dose, consider return to GI.

## 2020-07-15 ENCOUNTER — Ambulatory Visit: Payer: 59 | Admitting: Family Medicine

## 2020-07-19 ENCOUNTER — Ambulatory Visit
Admission: RE | Admit: 2020-07-19 | Discharge: 2020-07-19 | Disposition: A | Discharge status: Home / Self Care | Payer: 59 | Source: Ambulatory Visit | Attending: Family Medicine | Admitting: Family Medicine

## 2020-07-19 DIAGNOSIS — R1013 Epigastric pain: Secondary | ICD-10-CM

## 2020-07-19 DIAGNOSIS — R1084 Generalized abdominal pain: Secondary | ICD-10-CM

## 2020-07-21 ENCOUNTER — Encounter: Payer: Self-pay | Admitting: Family Medicine

## 2020-07-25 MED ORDER — LANSOPRAZOLE 30 MG PO CPDR: 30.0000 mg | DELAYED_RELEASE_CAPSULE | Freq: Every day | ORAL | 3 refills | Status: DC

## 2020-10-31 IMAGING — US US BREAST*R* LIMITED INC AXILLA
1 series · 8 of 8 positions shown · non-contrast
Comparison: 10/31/2018 and 04/02/2018.

CLINICAL DATA: Short-term follow-up for 2 probably benign masses in
the right breast, initially assessed on 04/02/2018.

EXAM:
ULTRASOUND OF THE RIGHT BREAST

[Series 1: us breast*right* limited inc axilla · 0.04mm/px · 8 of 8 slices shown]
[im 1/8]
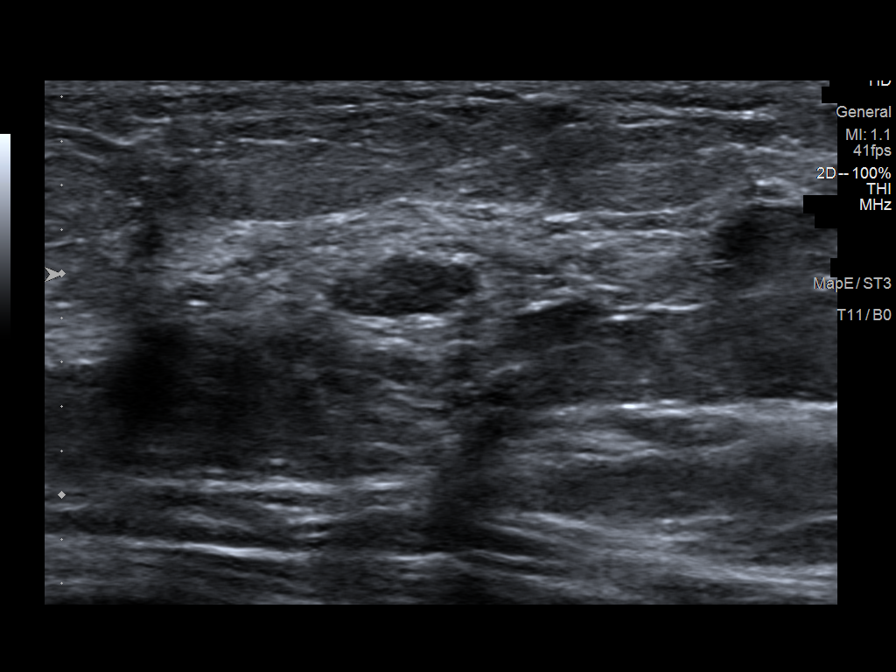
[im 2/8]
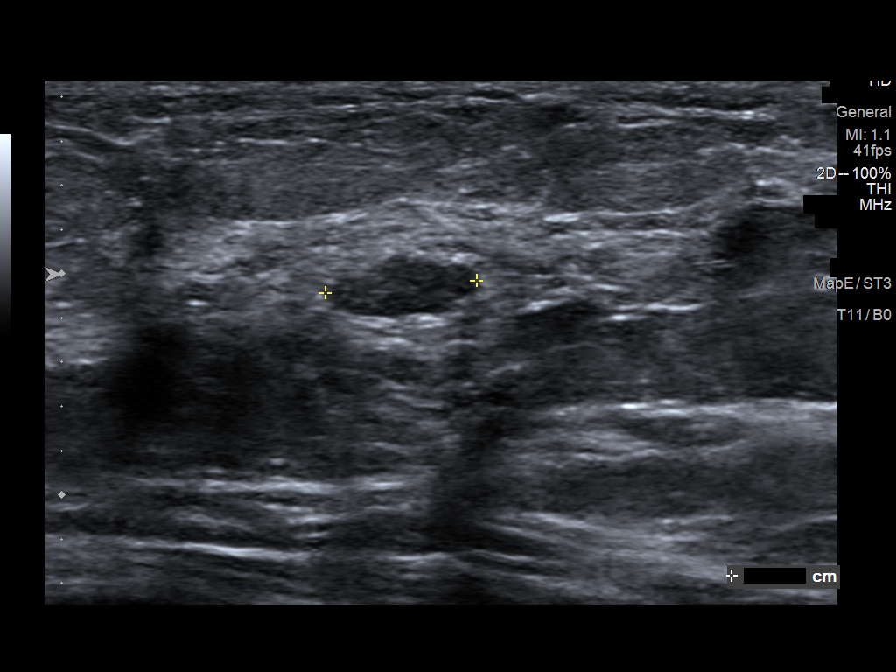
[im 3/8]
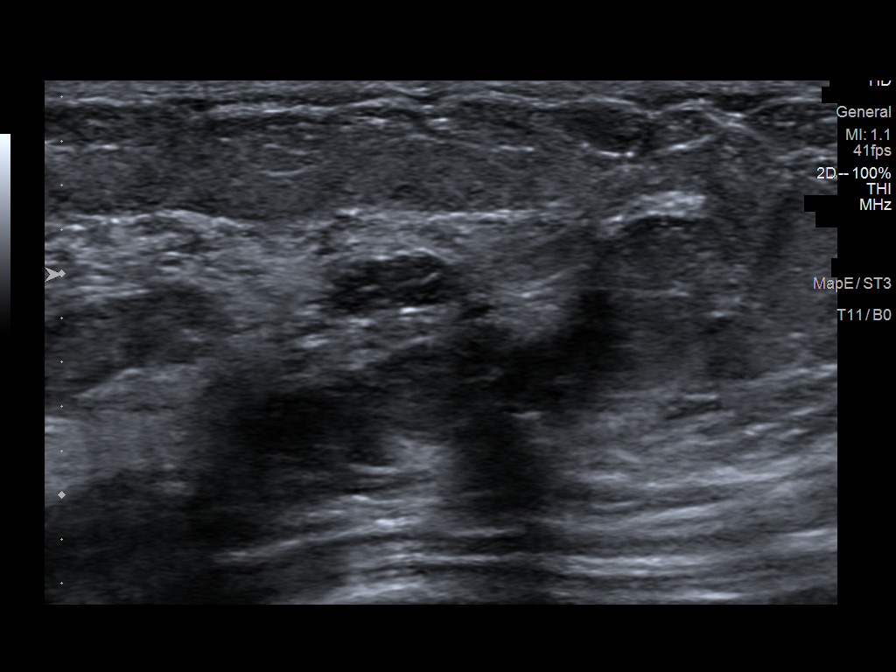
[im 4/8]
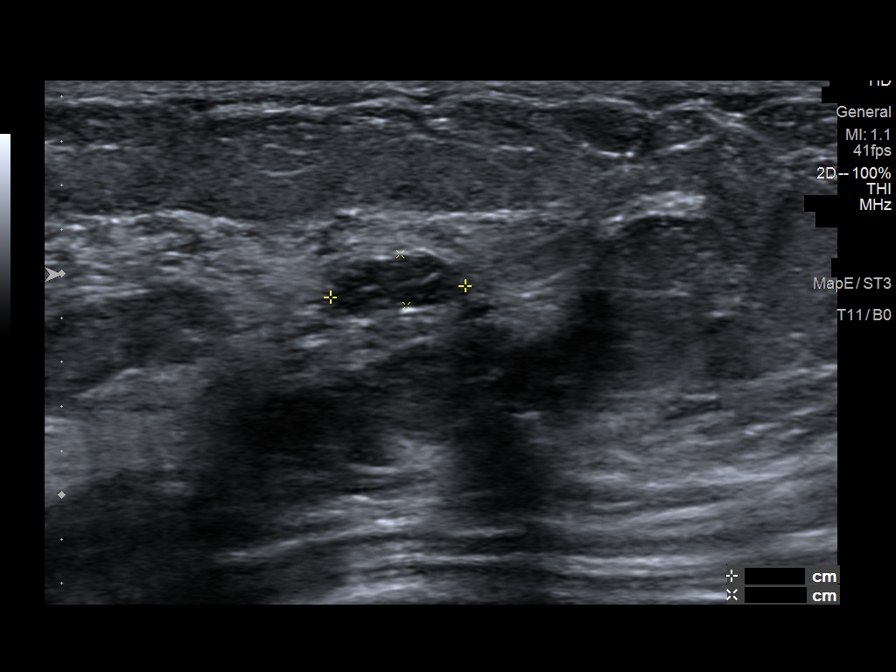
[im 5/8]
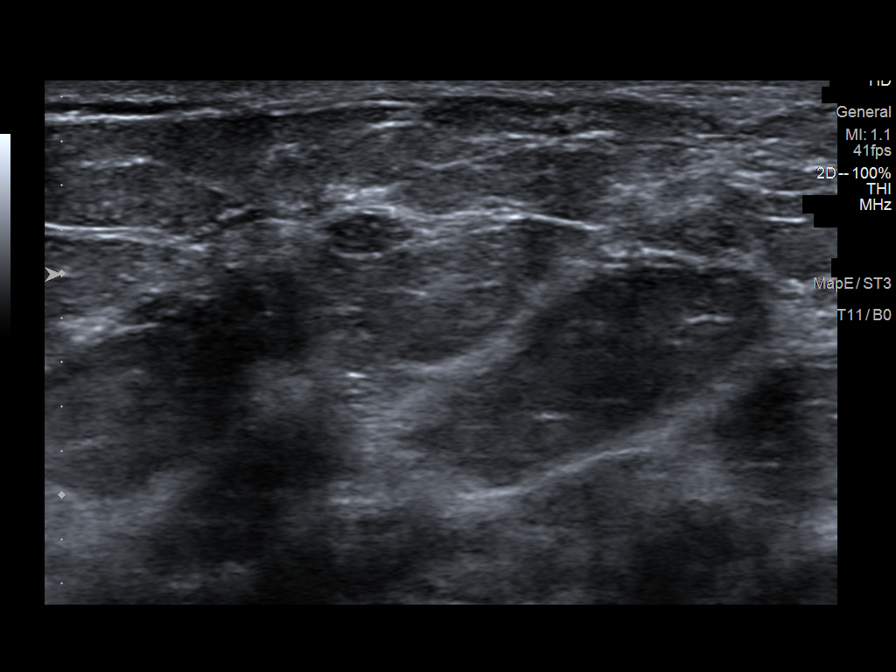
[im 6/8]
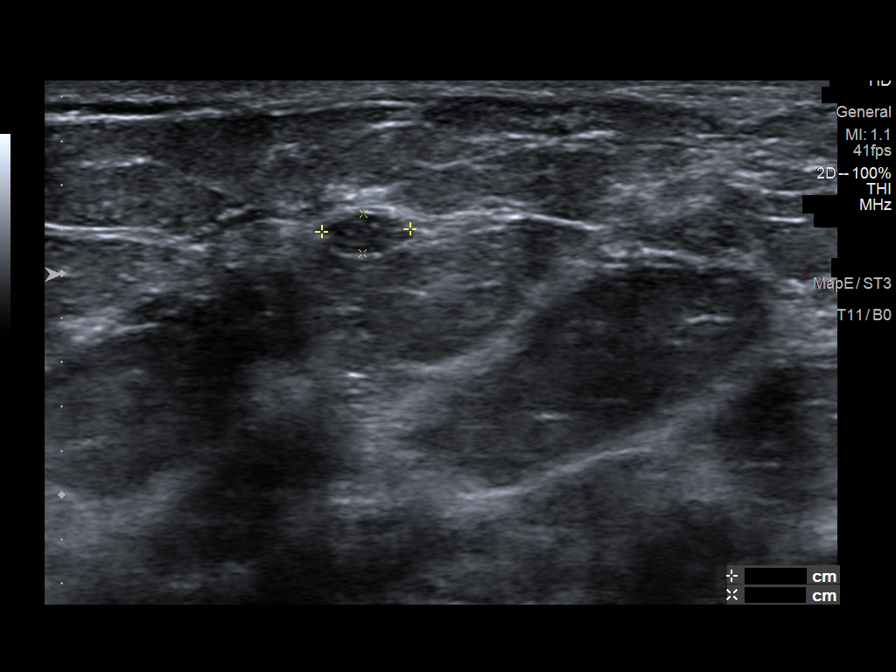
[im 7/8]
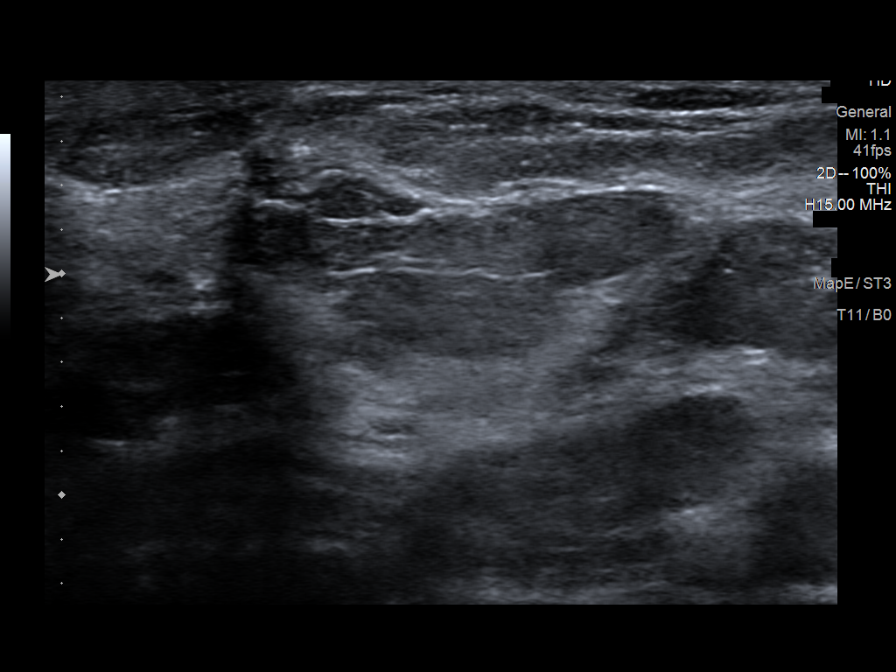
[im 8/8]
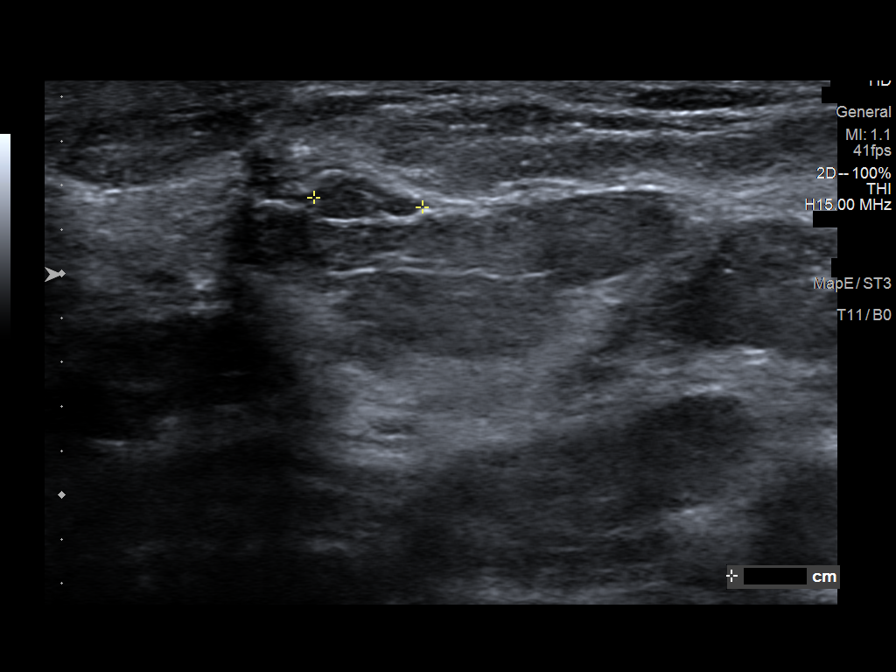

[8 of 8 positions shown; findings below may reference images not displayed]

FINDINGS: Targeted ultrasound is performed, showing a small hypoechoic
circumscribed mass in the right breast at 8 o'clock, 4 cm the
nipple, measuring 7 x 2 x 6 mm. In the 2 o'clock position of the
right breast, 1 cm from the nipple, there is a second
similar-appearing hypoechoic circumscribed oval mass measuring 5 x 2
x 4 mm. These are both unchanged.
IMPRESSION: Two small probably benign masses in the right breast, stable since
March 2018. One additional follow-up to document 2 years of stability
is recommended.

RECOMMENDATION:
Right breast ultrasound in 1 year to document 2 years of stability
for the 2 small probably benign masses.

I have discussed the findings and recommendations with the patient.
Results were also provided in writing at the conclusion of the
visit. If applicable, a reminder letter will be sent to the patient
regarding the next appointment.

BI-RADS CATEGORY  3: Probably benign.

## 2021-01-23 ENCOUNTER — Encounter: Payer: Self-pay | Admitting: Internal Medicine

## 2021-01-23 ENCOUNTER — Telehealth: Payer: Self-pay

## 2021-01-23 ENCOUNTER — Ambulatory Visit: Payer: BC Managed Care – PPO | Admitting: Internal Medicine

## 2021-01-23 ENCOUNTER — Other Ambulatory Visit: Payer: Self-pay

## 2021-01-23 VITALS — BP 120/76 | HR 72 | Temp 98.0°F | Resp 16 | Ht 64.0 in | Wt 160.0 lb

## 2021-01-23 DIAGNOSIS — J22 Unspecified acute lower respiratory infection: Secondary | ICD-10-CM | POA: Diagnosis not present

## 2021-01-23 MED ORDER — HYDROCODONE-HOMATROPINE 5-1.5 MG/5ML PO SYRP: 5.0000 mL | ORAL_SOLUTION | Freq: Four times a day (QID) | ORAL | 0 refills | Status: DC | PRN

## 2021-01-23 MED ORDER — CEFDINIR 300 MG PO CAPS: 300.0000 mg | ORAL_CAPSULE | Freq: Two times a day (BID) | ORAL | 0 refills | Status: AC

## 2021-01-23 MED ORDER — FLUCONAZOLE 150 MG PO TABS: 150.0000 mg | ORAL_TABLET | Freq: Once | ORAL | 3 refills | Status: AC

## 2021-01-23 NOTE — Patient Instructions (Signed)
Acute Bronchitis, Adult  Acute bronchitis is sudden or acute swelling of the air tubes (bronchi) in the lungs. Acute bronchitis causes these tubes to fill with mucus, which can make it hard to breathe. It can also cause coughing or wheezing. In adults, acute bronchitis usually goes away within 2 weeks. A cough caused by bronchitis may last up to 3 weeks. Smoking, allergies, and asthma can make the condition worse. What are the causes? This condition can be caused by germs and by substances that irritate the lungs, including:  Cold and flu viruses. The most common cause of this condition is the virus that causes the common cold.  Bacteria.  Substances that irritate the lungs, including: ? Smoke from cigarettes and other forms of tobacco. ? Dust and pollen. ? Fumes from chemical products, gases, or burned fuel. ? Other materials that pollute indoor or outdoor air.  Close contact with someone who has acute bronchitis. What increases the risk? The following factors may make you more likely to develop this condition:  A weak body's defense system, also called the immune system.  A condition that affects your lungs and breathing, such as asthma. What are the signs or symptoms? Common symptoms of this condition include:  Lung and breathing problems, such as: ? Coughing. This may bring up clear, yellow, or green mucus from your lungs (sputum). ? Wheezing. ? Having too much mucus in your lungs (chest congestion). ? Having shortness of breath.  A fever.  Chills.  Aches and pains, including: ? Tightness in your chest and other body aches. ? A sore throat. How is this diagnosed? This condition is usually diagnosed based on:  Your symptoms and medical history.  A physical exam. You may also have other tests, including tests to rule out other conditions, such pneumonia. These tests include:  A test of lung function.  Test of a mucus sample to look for the presence of  bacteria.  Tests to check the oxygen level in your blood.  Blood tests.  Chest X-ray. How is this treated? Most cases of acute bronchitis clear up over time without treatment. Your health care provider may recommend:  Drinking more fluids. This can thin your mucus, which may improve your breathing.  Taking a medicine for a fever or cough.  Using a device that gets medicine into your lungs (inhaler) to help improve breathing and control coughing.  Using a vaporizer or a humidifier. These are machines that add water to the air to help you breathe better. Follow these instructions at home: Activity  Get plenty of rest.  Return to your normal activities as told by your health care provider. Ask your health care provider what activities are safe for you. Lifestyle  Drink enough fluid to keep your urine pale yellow.  Do not drink alcohol.  Do not use any products that contain nicotine or tobacco, such as cigarettes, e-cigarettes, and chewing tobacco. If you need help quitting, ask your health care provider. Be aware that: ? Your bronchitis will get worse if you smoke or breathe in other people's smoke (secondhand smoke). ? Your lungs will heal faster if you quit smoking. General instructions  Take over-the-counter and prescription medicines only as told by your health care provider.  Use an inhaler, vaporizer, or humidifier as told by your health care provider.  If you have a sore throat, gargle with a salt-water mixture 3-4 times a day or as needed. To make a salt-water mixture, completely dissolve -1 tsp (3-6 g)   of salt in 1 cup (237 mL) of warm water.  Keep all follow-up visits as told by your health care provider. This is important.   How is this prevented? To lower your risk of getting this condition again:  Wash your hands often with soap and water. If soap and water are not available, use hand sanitizer.  Avoid contact with people who have cold symptoms.  Try not to  touch your mouth, nose, or eyes with your hands.  Avoid places where there are fumes from chemicals. Breathing these fumes will make your condition worse.  Get the flu shot every year.   Contact a health care provider if:  Your symptoms do not improve after 2 weeks of treatment.  You vomit more than once or twice.  You have symptoms of dehydration such as: ? Dark urine. ? Dry skin or eyes. ? Increased thirst. ? Headaches. ? Confusion. ? Muscle cramps. Get help right away if you:  Cough up blood.  Feel pain in your chest.  Have severe shortness of breath.  Faint or keep feeling like you are going to faint.  Have a severe headache.  Have fever or chills that get worse. These symptoms may represent a serious problem that is an emergency. Do not wait to see if the symptoms will go away. Get medical help right away. Call your local emergency services (911 in the [place] Do not drive yourself to the hospital. Summary  Acute bronchitis is sudden (acute) inflammation of the air tubes (bronchi) between the windpipe and the lungs. In adults, acute bronchitis usually goes away within 2 weeks, although coughing may last 3 weeks or longer  Take over-the-counter and prescription medicines only as told by your health care provider.  Drink enough fluid to keep your urine pale yellow.  Contact a health care provider if your symptoms do not improve after 2 weeks of treatment.  Get help right away if you cough up blood, faint, or have chest pain or shortness of breath. This information is not intended to replace advice given to you by your health care provider. Make sure you discuss any questions you have with your health care provider. Document Revised: 07/06/2019 Document Reviewed: 05/15/2019 Elsevier Patient Education  2021 [employer]  

## 2021-01-23 NOTE — Telephone Encounter (Signed)
Gita Kudo at front desk gave me a my chart message that pt scheduled app on 02/03/21 with Dr Reece Agar for earache and dizziness. Per appt notes pt already has appt with Dr Sanda Linger 01/23/21 at 11:30 for cough. Unable to reach pt by phone. Sending note to Dr Reece Agar as PCP, Dr Sanda Linger and Misty Stanley CMA. Also per appt notes pt has cancelled appt with Dr Reece Agar on 02/03/21.

## 2021-01-23 NOTE — Progress Notes (Addendum)
Subjective:  Patient ID: Martha Coleman, female    DOB: Mar 08, 1986  Age: 35 y.o. MRN: [id number]  CC: URI  This visit occurred during the SARS-CoV-2 public health emergency.  Safety protocols were in place, including screening questions prior to the visit, additional usage of staff PPE, and extensive cleaning of exam room while observing appropriate contact time as indicated for disinfecting solutions.    HPI [place] presents for concerns about a cough -she complains of 1 week history of cough that is rarely productive of yellow phlegm.  She had some penicillin left around so she started taking it but has not noticed any improvement in her symptoms.  Outpatient Medications Prior to Visit  Medication Sig Dispense Refill  . Cetirizine HCl (ZYRTEC ALLERGY PO) Take by mouth.    . spironolactone (ALDACTONE) 50 MG tablet Take 50 mg by mouth daily. with food    . lansoprazole (PREVACID) 30 MG capsule Take 1 capsule (30 mg total) by mouth daily at 12 noon. Daily for 3 weeks then as needed 30 capsule 3   No facility-administered medications prior to visit.    ROS Review of Systems  Constitutional: Negative for chills, diaphoresis, fatigue and fever.  HENT: Negative.   Respiratory: Positive for cough. Negative for chest tightness, shortness of breath and wheezing.   Cardiovascular: Negative for chest pain, palpitations and leg swelling.  Gastrointestinal: Negative for abdominal pain and diarrhea.  Genitourinary: Negative.  Negative for difficulty urinating.  Musculoskeletal: Negative.   Skin: Negative.  Negative for color change and pallor.  Neurological: Negative.  Negative for dizziness and weakness.  Hematological: Negative for adenopathy. Does not bruise/bleed easily.  Psychiatric/Behavioral: Negative.     Objective:  BP 120/76   Pulse 72   Temp 98 F (36.7 C) (Oral)   Resp 16   Ht 5\' 4"  (1.626 m)   Wt 160 lb (72.6 kg)   LMP 01/20/2021   SpO2 98%   BMI 27.46 kg/m    BP Readings from Last 3 Encounters:  01/23/21 120/76  07/06/20 124/82  12/22/19 118/80    Wt Readings from Last 3 Encounters:  01/23/21 160 lb (72.6 kg)  07/06/20 156 lb 14.4 oz (71.2 kg)  12/22/19 155 lb (70.3 kg)    Physical Exam Vitals reviewed.  Constitutional:      Appearance: Normal appearance. She is not ill-appearing.  HENT:     Right Ear: Tympanic membrane normal.     Left Ear: Tympanic membrane normal.     Nose: Nose normal.     Mouth/Throat:     Mouth: Mucous membranes are moist.  Eyes:     General: No scleral icterus.    Conjunctiva/sclera: Conjunctivae normal.  Cardiovascular:     Rate and Rhythm: Normal rate and regular rhythm.     Heart sounds: No murmur heard.   Pulmonary:     Effort: Pulmonary effort is normal.     Breath sounds: Normal breath sounds. No stridor. No wheezing, rhonchi or rales.  Abdominal:     General: Abdomen is flat.  Musculoskeletal:     Cervical back: Neck supple.  Lymphadenopathy:     Cervical: No cervical adenopathy.  Skin:    General: Skin is warm and dry.     Findings: No lesion.  Neurological:     General: No focal deficit present.     Mental Status: She is alert.  Psychiatric:        Mood and Affect: Mood normal.  Behavior: Behavior normal.     Lab Results  Component Value Date   WBC 8.3 07/06/2020   HGB 14.7 07/06/2020   HCT 42.0 07/06/2020   PLT 252.0 07/06/2020   GLUCOSE 96 07/06/2020   ALT 17 07/06/2020   AST 15 07/06/2020   NA 138 07/06/2020   K 3.7 07/06/2020   CL 101 07/06/2020   CREATININE 0.86 07/06/2020   BUN 18 07/06/2020   CO2 29 07/06/2020   TSH 2.99 07/06/2020    US Abdomen Complete  Result Date: 07/19/2020 CLINICAL DATA:  Epigastric pain EXAM: ABDOMEN ULTRASOUND COMPLETE COMPARISON:  Ultrasound 12/28/2019 FINDINGS: Gallbladder: No gallstones or wall thickening visualized. No sonographic Murphy sign noted by sonographer. Common bile duct: Diameter: 2.6 mm Liver: No focal lesion  identified. Within normal limits in parenchymal echogenicity. Portal vein is patent on color Doppler imaging with normal direction of blood flow towards the liver. IVC: No abnormality visualized. Pancreas: Visualized portion unremarkable. Spleen: Size and appearance within normal limits. Right Kidney: Length: 10.9 cm. Echogenicity within normal limits. No mass or hydronephrosis visualized. Left Kidney: Length: 12.6 cm. Echogenicity within normal limits. No mass or hydronephrosis visualized. Abdominal aorta: No aneurysm visualized. Other findings: None. IMPRESSION: Negative abdominal ultrasound Electronically Signed   By: Jasmine Pang M.D.   On: 07/19/2020 21:50    Assessment & Plan:   [place] was seen today for uri.  Diagnoses and all orders for this visit:  LRTI (lower respiratory tract infection)- Will treat with a broad-spectrum antibiotic.  She will use fluconazole if needed for vaginal yeast infection.  Will control the cough with hydrocodone/homatroprine. -     fluconazole (DIFLUCAN) 150 MG tablet; Take 1 tablet (150 mg total) by mouth once for 1 dose. -     cefdinir (OMNICEF) 300 MG capsule; Take 1 capsule (300 mg total) by mouth 2 (two) times daily for 7 days. -     HYDROcodone-homatropine (HYCODAN) 5-1.5 MG/5ML syrup; Take 5 mLs by mouth every 6 (six) hours as needed for cough.   I have discontinued [place] L. Kleinsasser "Leann"'s lansoprazole. I am also having her start on fluconazole, cefdinir, and HYDROcodone-homatropine. Additionally, I am having her maintain her spironolactone and Cetirizine HCl (ZYRTEC ALLERGY PO).  Meds ordered this encounter  Medications  . fluconazole (DIFLUCAN) 150 MG tablet    Sig: Take 1 tablet (150 mg total) by mouth once for 1 dose.    Dispense:  1 tablet    Refill:  3  . cefdinir (OMNICEF) 300 MG capsule    Sig: Take 1 capsule (300 mg total) by mouth 2 (two) times daily for 7 days.    Dispense:  14 capsule    Refill:  0  . HYDROcodone-homatropine  (HYCODAN) 5-1.5 MG/5ML syrup    Sig: Take 5 mLs by mouth every 6 (six) hours as needed for cough.    Dispense:  120 mL    Refill:  0     Follow-up: Return if symptoms worsen or fail to improve.  Sanda Linger, MD

## 2021-02-03 ENCOUNTER — Ambulatory Visit: Payer: BC Managed Care – PPO | Admitting: Family Medicine

## 2021-02-06 ENCOUNTER — Telehealth: Payer: Self-pay | Admitting: Family Medicine

## 2021-02-06 NOTE — Telephone Encounter (Signed)
Called and left detailed message ( per DPR) to reschedule the 02/24/21 appt due to provider being out of the office  Needs to schedule cpe and labs. EM

## 2021-02-07 ENCOUNTER — Telehealth: Payer: Self-pay | Admitting: Family Medicine

## 2021-02-07 NOTE — Telephone Encounter (Signed)
Error

## 2021-02-17 ENCOUNTER — Other Ambulatory Visit: Payer: 59

## 2021-02-24 ENCOUNTER — Encounter: Payer: 59 | Admitting: Family Medicine

## 2021-03-21 ENCOUNTER — Encounter: Payer: Self-pay | Admitting: Family Medicine

## 2021-04-04 ENCOUNTER — Other Ambulatory Visit: Payer: Self-pay | Admitting: Family Medicine

## 2021-04-04 DIAGNOSIS — Z1159 Encounter for screening for other viral diseases: Secondary | ICD-10-CM

## 2021-04-04 DIAGNOSIS — Z148 Genetic carrier of other disease: Secondary | ICD-10-CM

## 2021-04-04 DIAGNOSIS — Z13220 Encounter for screening for lipoid disorders: Secondary | ICD-10-CM

## 2021-04-07 ENCOUNTER — Other Ambulatory Visit: Payer: Self-pay

## 2021-04-07 ENCOUNTER — Other Ambulatory Visit (INDEPENDENT_AMBULATORY_CARE_PROVIDER_SITE_OTHER): Payer: BC Managed Care – PPO

## 2021-04-07 DIAGNOSIS — Z1159 Encounter for screening for other viral diseases: Secondary | ICD-10-CM

## 2021-04-07 DIAGNOSIS — Z13220 Encounter for screening for lipoid disorders: Secondary | ICD-10-CM

## 2021-04-07 DIAGNOSIS — Z148 Genetic carrier of other disease: Secondary | ICD-10-CM

## 2021-04-07 LAB — CBC WITH DIFFERENTIAL/PLATELET
Basophils Absolute: 0 10*3/uL (ref 0.0–0.1)
Basophils Relative: 0.4 % (ref 0.0–3.0)
Eosinophils Absolute: 0.3 10*3/uL (ref 0.0–0.7)
Eosinophils Relative: 3.2 % (ref 0.0–5.0)
HCT: 43.4 % (ref 36.0–46.0)
Hemoglobin: 15.2 g/dL — ABNORMAL HIGH (ref 12.0–15.0)
Lymphocytes Relative: 20.1 % (ref 12.0–46.0)
Lymphs Abs: 1.6 10*3/uL (ref 0.7–4.0)
MCHC: 35 g/dL (ref 30.0–36.0)
MCV: 88.3 fl (ref 78.0–100.0)
Monocytes Absolute: 0.4 10*3/uL (ref 0.1–1.0)
Monocytes Relative: 4.7 % (ref 3.0–12.0)
Neutro Abs: 5.8 10*3/uL (ref 1.4–7.7)
Neutrophils Relative %: 71.6 % (ref 43.0–77.0)
Platelets: 238 10*3/uL (ref 150.0–400.0)
RBC: 4.91 Mil/uL (ref 3.87–5.11)
RDW: 12.1 % (ref 11.5–15.5)
WBC: 8.2 10*3/uL (ref 4.0–10.5)

## 2021-04-07 LAB — FERRITIN: Ferritin: 29.5 ng/mL (ref 10.0–291.0)

## 2021-04-10 LAB — COMPREHENSIVE METABOLIC PANEL
ALT: 20 U/L (ref 0–35)
AST: 18 U/L (ref 0–37)
Albumin: 4.4 g/dL (ref 3.5–5.2)
Alkaline Phosphatase: 39 U/L (ref 39–117)
BUN: 15 mg/dL (ref 6–23)
CO2: 24 mEq/L (ref 19–32)
Calcium: 9.5 mg/dL (ref 8.4–10.5)
Chloride: 104 mEq/L (ref 96–112)
Creatinine, Ser: 0.85 mg/dL (ref 0.40–1.20)
GFR: 89.54 mL/min (ref >60.00)
Glucose, Bld: 75 mg/dL (ref 70–99)
Potassium: 4.7 mEq/L (ref 3.5–5.1)
Sodium: 137 mEq/L (ref 135–145)
Total Bilirubin: 0.7 mg/dL (ref 0.2–1.2)
Total Protein: 7.1 g/dL (ref 6.0–8.3)

## 2021-04-10 LAB — LIPID PANEL
Cholesterol: 189 mg/dL (ref 0–200)
HDL: 52.1 mg/dL (ref >39.00)
LDL Cholesterol: 124 mg/dL — ABNORMAL HIGH (ref 0–99)
NonHDL: 137.14
Total CHOL/HDL Ratio: 4
Triglycerides: 64 mg/dL (ref 0.0–149.0)
VLDL: 12.8 mg/dL (ref 0.0–40.0)

## 2021-04-10 LAB — HEPATITIS C ANTIBODY
Hepatitis C Ab: NONREACTIVE
SIGNAL TO CUT-OFF: 0 (ref <1.00)

## 2021-04-10 LAB — IBC PANEL
Iron: 148 ug/dL — ABNORMAL HIGH (ref 42–145)
Saturation Ratios: 37.9 % (ref 20.0–50.0)
Transferrin: 279 mg/dL (ref 212.0–360.0)

## 2021-04-14 ENCOUNTER — Ambulatory Visit (INDEPENDENT_AMBULATORY_CARE_PROVIDER_SITE_OTHER): Payer: BC Managed Care – PPO | Admitting: Family Medicine

## 2021-04-14 ENCOUNTER — Encounter: Payer: Self-pay | Admitting: Family Medicine

## 2021-04-14 ENCOUNTER — Other Ambulatory Visit: Payer: Self-pay

## 2021-04-14 VITALS — BP 112/76 | HR 84 | Temp 98.0°F | Ht 63.5 in | Wt 163.1 lb

## 2021-04-14 DIAGNOSIS — Z0001 Encounter for general adult medical examination with abnormal findings: Secondary | ICD-10-CM

## 2021-04-14 DIAGNOSIS — R7989 Other specified abnormal findings of blood chemistry: Secondary | ICD-10-CM

## 2021-04-14 DIAGNOSIS — Z148 Genetic carrier of other disease: Secondary | ICD-10-CM

## 2021-04-14 DIAGNOSIS — R1013 Epigastric pain: Secondary | ICD-10-CM

## 2021-04-14 DIAGNOSIS — K921 Melena: Secondary | ICD-10-CM | POA: Diagnosis not present

## 2021-04-14 DIAGNOSIS — R87619 Unspecified abnormal cytological findings in specimens from cervix uteri: Secondary | ICD-10-CM

## 2021-04-14 DIAGNOSIS — G44039 Episodic paroxysmal hemicrania, not intractable: Secondary | ICD-10-CM

## 2021-04-14 DIAGNOSIS — R079 Chest pain, unspecified: Secondary | ICD-10-CM

## 2021-04-14 NOTE — Progress Notes (Signed)
Patient ID: Martha Coleman, female    DOB: 11-Sep-1986, 35 y.o.   MRN: [id number]  This visit was conducted in person.  BP 112/76   Pulse 84   Temp 98 F (36.7 C) (Temporal)   Ht 5' 3.5" (1.613 m)   Wt 163 lb 1 oz (74 kg)   LMP 03/17/2021   SpO2 99%   BMI 28.43 kg/m    CC: CPE Subjective:   HPI: Martha Coleman is a 35 y.o. 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 Has seen neuro Dr Martha Coleman. Just got botox shots today, last dose was 6 months ago.   H/o COVID 11/2020.   Ongoing chest pain discomfort that started post-COVID - but not as bad as previously. Had pulm and GI eval, had multiple reassuring EKGs. Describes substernal discomfort that comes and goes. Notes ongoing epigastric abd discomfort with any foods (no specific trigger), stays nauseated. Late last year noted bright red blood in stool which she attributed to hemorrhoids. This lasted a few weeks but has since resolved. Ongoing indigestion and bloating however.  No fmhx IBD. No fmhx colon cancer.  She took lansoprazole for 1.5 wks with benefit, stopped 1 wk ago.  Trouble tolerating NSAIDs, vit C due to GI issues.  Never heartburn.  She cut out red meat in diet.  Last saw GI (Martha Coleman) 12/2019 - started on 16mo course pantoprazole 40mg  daily for presumed gastritis in setting of post-COVID steroids, abx - with benefit.  Preventative: Well woman - through GYN last seen 09/2020. Normal paps in the past.  Breast cancer screening - not due for mammogram. No fmhx breast cancer.  DEXA scan - not due Flu shot yearly COVID vaccine - discussed, declines at this time Tdap 2017 Seat belt use discussed  Sunscreen use discussed. No changing moles on skin. Dentist - yearly  Eye exam - yearly  Non smoker  Alcohol - seldom   Lives at home with family Right handed Caffeine: maybe 1 cup/day Occ: works at dermatology office Activity: works out  regularly Diet: good water, fruits/vegetables daily - overall very healthy recently     Relevant past medical, surgical, family and social history reviewed and updated as indicated. Interim medical history since our last visit reviewed. Allergies and medications reviewed and updated. Outpatient Medications Prior to Visit  Medication Sig Dispense Refill   Cetirizine HCl (ZYRTEC ALLERGY PO) Take by mouth.     spironolactone (ALDACTONE) 50 MG tablet Take 50 mg by mouth daily. with food     HYDROcodone-homatropine (HYCODAN) 5-1.5 MG/5ML syrup Take 5 mLs by mouth every 6 (six) hours as needed for cough. 120 mL 0   No facility-administered medications prior to visit.     Per HPI unless specifically indicated in ROS section below Review of Systems  Constitutional:  Negative for activity change, appetite change, chills, fatigue, fever and unexpected weight change.  HENT:  Negative for hearing loss.   Eyes:  Negative for visual disturbance.  Respiratory:  Negative for cough, chest tightness, shortness of breath and wheezing.   Cardiovascular:  Positive for chest pain. Negative for palpitations and leg swelling.  Gastrointestinal:  Positive for abdominal distention, abdominal pain, blood in stool and nausea. Negative for constipation, diarrhea and vomiting.  Genitourinary:  Negative for difficulty urinating and hematuria.  Musculoskeletal:  Negative for arthralgias, myalgias and neck pain.  Skin:  Negative for rash.  Neurological:  Positive  for headaches. Negative for dizziness, seizures and syncope.  Hematological:  Negative for adenopathy. Does not bruise/bleed easily.  Psychiatric/Behavioral:  Negative for dysphoric mood. The patient is not nervous/anxious.   Objective:  BP 112/76   Pulse 84   Temp 98 F (36.7 C) (Temporal)   Ht 5' 3.5" (1.613 m)   Wt 163 lb 1 oz (74 kg)   LMP 03/17/2021   SpO2 99%   BMI 28.43 kg/m   Wt Readings from Last 3 Encounters:  04/14/21 163 lb 1 oz (74 kg)   01/23/21 160 lb (72.6 kg)  07/06/20 156 lb 14.4 oz (71.2 kg)      Physical Exam Vitals and nursing note reviewed.  Constitutional:      Appearance: Normal appearance. She is not ill-appearing.  HENT:     Head: Normocephalic and atraumatic.     Right Ear: Tympanic membrane, ear canal and external ear normal. There is no impacted cerumen.     Left Ear: Tympanic membrane, ear canal and external ear normal. There is no impacted cerumen.  Eyes:     General:        Right eye: No discharge.        Left eye: No discharge.     Extraocular Movements: Extraocular movements intact.     Conjunctiva/sclera: Conjunctivae normal.     Pupils: Pupils are equal, round, and reactive to light.  Neck:     Thyroid: No thyroid mass or thyromegaly.  Cardiovascular:     Rate and Rhythm: Normal rate and regular rhythm.     Pulses: Normal pulses.     Heart sounds: Normal heart sounds. No murmur heard. Pulmonary:     Effort: Pulmonary effort is normal. No respiratory distress.     Breath sounds: Normal breath sounds. No wheezing, rhonchi or rales.     Comments: No reproducible chest wall pain. Chest:     Chest wall: No tenderness.  Abdominal:     General: Bowel sounds are normal. There is no distension.     Palpations: Abdomen is soft. There is no mass.     Tenderness: There is no abdominal tenderness. There is no guarding or rebound.     Hernia: No hernia is present.  Musculoskeletal:     Cervical back: Normal range of motion and neck supple. No rigidity.     Right lower leg: No edema.     Left lower leg: No edema.  Lymphadenopathy:     Cervical: No cervical adenopathy.  Skin:    General: Skin is warm and dry.     Findings: No rash.  Neurological:     General: No focal deficit present.     Mental Status: She is alert. Mental status is at baseline.  Psychiatric:        Mood and Affect: Mood normal.        Behavior: Behavior normal.      Results for orders placed or performed in visit on  04/07/21  IBC panel  Result Value Ref Range   Iron 148 (H) 42 - 145 ug/dL   Transferrin 161.0279.0 960.4212.0 - 360.0 mg/dL   Saturation Ratios 54.037.9 20.0 - 50.0 %  Hepatitis C antibody  Result Value Ref Range   Hepatitis C Ab NON-REACTIVE NON-REACTIVE   SIGNAL TO CUT-OFF 0.00 <1.00  Ferritin  Result Value Ref Range   Ferritin 29.5 10.0 - 291.0 ng/mL  CBC with Differential/Platelet  Result Value Ref Range   WBC 8.2 4.0 - 10.5 K/uL  RBC 4.91 3.87 - 5.11 Mil/uL   Hemoglobin 15.2 (H) 12.0 - 15.0 g/dL   HCT 40.9 81.1 - 91.4 %   MCV 88.3 78.0 - 100.0 fl   MCHC 35.0 30.0 - 36.0 g/dL   RDW 78.2 95.6 - 21.3 %   Platelets 238.0 150.0 - 400.0 K/uL   Neutrophils Relative % 71.6 43.0 - 77.0 %   Lymphocytes Relative 20.1 12.0 - 46.0 %   Monocytes Relative 4.7 3.0 - 12.0 %   Eosinophils Relative 3.2 0.0 - 5.0 %   Basophils Relative 0.4 0.0 - 3.0 %   Neutro Abs 5.8 1.4 - 7.7 K/uL   Lymphs Abs 1.6 0.7 - 4.0 K/uL   Monocytes Absolute 0.4 0.1 - 1.0 K/uL   Eosinophils Absolute 0.3 0.0 - 0.7 K/uL   Basophils Absolute 0.0 0.0 - 0.1 K/uL  Comprehensive metabolic panel  Result Value Ref Range   Sodium 137 135 - 145 mEq/L   Potassium 4.7 3.5 - 5.1 mEq/L   Chloride 104 96 - 112 mEq/L   CO2 24 19 - 32 mEq/L   Glucose, Bld 75 70 - 99 mg/dL   BUN 15 6 - 23 mg/dL   Creatinine, Ser 0.86 0.40 - 1.20 mg/dL   Total Bilirubin 0.7 0.2 - 1.2 mg/dL   Alkaline Phosphatase 39 39 - 117 U/L   AST 18 0 - 37 U/L   ALT 20 0 - 35 U/L   Total Protein 7.1 6.0 - 8.3 g/dL   Albumin 4.4 3.5 - 5.2 g/dL   GFR 57.84 >69.62 mL/min   Calcium 9.5 8.4 - 10.5 mg/dL  Lipid panel  Result Value Ref Range   Cholesterol 189 0 - 200 mg/dL   Triglycerides 95.2 0.0 - 149.0 mg/dL   HDL 84.13 >24.40 mg/dL   VLDL 10.2 0.0 - 72.5 mg/dL   LDL Cholesterol 366 (H) 0 - 99 mg/dL   Total CHOL/HDL Ratio 4    NonHDL 137.14    Lab Results  Component Value Date   TSH 2.99 07/06/2020    Assessment & Plan:  This visit occurred during the  SARS-CoV-2 public health emergency.  Safety protocols were in place, including screening questions prior to the visit, additional usage of staff PPE, and extensive cleaning of exam room while observing appropriate contact time as indicated for disinfecting solutions.   Problem List Items Addressed This Visit     Abnormal cervical Papanicolaou smear    Regularly sees GYN, recent paps all normal.        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        Carrier of hemochromatosis HFE gene mutation    Reviewed iron panel, LFTs, CBC.        Chest pain    Non reproducible pain post COVID - anticipate GI related. Will send H pylori stool Ag test, start PPI after collecting.        Epigastric pain    See above. Anticipate component of gastritis. Check H pylori then restart PPI for 3-4 wks.  Check fecal calprotectin in h/o bloody stools Fall 2021.  Refer back to GI for further evaluation.        Relevant Orders   Ambulatory referral to Gastroenterology   Abnormal TSH    H/o this, was thyroid replacement for a short period. Update TFTs next labs.  Encounter for general adult medical examination with abnormal findings - Primary    Preventative protocols reviewed and updated unless pt declined. Discussed healthy diet and lifestyle.        Blood in stool    Episode last Fall of blood in stool for several weeks, has since resolved. Not consistent with melena.  Check H pylori stool Ag and fecal calprotectin and refer back to GI.        Relevant Orders   Helicobacter pylori special antigen   CALPROTECTIN   Ambulatory referral to Gastroenterology     No orders of the defined types were placed in this encounter.  Orders Placed This Encounter  Procedures   Helicobacter pylori special antigen    Standing Status:   Future    Standing Expiration  Date:   04/03/2022   CALPROTECTIN    Standing Status:   Future    Standing Expiration Date:   04/03/2022   Ambulatory referral to Gastroenterology    Referral Priority:   Routine    Referral Type:   Consultation    Referral Reason:   Specialty Services Required    Number of Visits Requested:   1    Patient instructions: Pass by lab to pick up stool tests. Wait 1 more week then complete.  Then may restart lansoprazole course for 3 weeks.  We will refer you to GI for further evaluation of GI symptoms.  Good to see you today.  Return as needed or in 1 year for next physical.   Follow up plan: Return in about 1 year (around 04/14/2022) for annual exam, prior fasting for blood work.  Eustaquio Boyden, MD

## 2021-04-14 NOTE — Patient Instructions (Signed)
Pass by lab to pick up stool tests. Wait 1 more week then complete.  Then may restart lansoprazole course for 3 weeks.  We will refer you to GI for further evaluation of GI symptoms.  Good to see you today. Return as needed or in 1 year for next physical.   Health Maintenance, Female Adopting a healthy lifestyle and getting preventive care are important in promoting health and wellness. Ask your health care provider about: The right schedule for you to have regular tests and exams. Things you can do on your own to prevent diseases and keep yourself healthy. What should I know about diet, weight, and exercise? Eat a healthy diet  Eat a diet that includes plenty of vegetables, fruits, low-fat dairy products, and lean protein. Do not eat a lot of foods that are high in solid fats, added sugars, or sodium.  Maintain a healthy weight Body mass index (BMI) is used to identify weight problems. It estimates body fat based on height and weight. Your health care provider can help determineyour BMI and help you achieve or maintain a healthy weight. Get regular exercise Get regular exercise. This is one of the most important things you can do for your health. Most adults should: Exercise for at least 150 minutes each week. The exercise should increase your heart rate and make you sweat (moderate-intensity exercise). Do strengthening exercises at least twice a week. This is in addition to the moderate-intensity exercise. Spend less time sitting. Even light physical activity can be beneficial. Watch cholesterol and blood lipids Have your blood tested for lipids and cholesterol at 34 years of age, then havethis test every 5 years. Have your cholesterol levels checked more often if: Your lipid or cholesterol levels are high. You are older than 33 years of age. You are at high risk for heart disease. What should I know about cancer screening? Depending on your health history and family history, you may  need to have cancer screening at various ages. This may include screening for: Breast cancer. Cervical cancer. Colorectal cancer. Skin cancer. Lung cancer. What should I know about heart disease, diabetes, and high blood pressure? Blood pressure and heart disease High blood pressure causes heart disease and increases the risk of stroke. This is more likely to develop in people who have high blood pressure readings, are of African descent, or are overweight. Have your blood pressure checked: Every 3-5 years if you are 29-53 years of age. Every year if you are 30 years old or older. Diabetes Have regular diabetes screenings. This checks your fasting blood sugar level. Have the screening done: Once every three years after age 54 if you are at a normal weight and have a low risk for diabetes. More often and at a younger age if you are overweight or have a high risk for diabetes. What should I know about preventing infection? Hepatitis B If you have a higher risk for hepatitis B, you should be screened for this virus. Talk with your health care provider to find out if you are at risk forhepatitis B infection. Hepatitis C Testing is recommended for: Everyone born from [birth year] through 1964. Anyone with known risk factors for hepatitis C. Sexually transmitted infections (STIs) Get screened for STIs, including gonorrhea and chlamydia, if: You are sexually active and are younger than 34 years of age. You are older than 34 years of age and your health care provider tells you that you are at risk for this type of infection. Your  sexual activity has changed since you were last screened, and you are at increased risk for chlamydia or gonorrhea. Ask your health care provider if you are at risk. Ask your health care provider about whether you are at high risk for HIV. Your health care provider may recommend a prescription medicine to help prevent HIV infection. If you choose to take medicine to prevent HIV,  you should first get tested for HIV. You should then be tested every 3 months for as long as you are taking the medicine. Pregnancy If you are about to stop having your period (premenopausal) and you may become pregnant, seek counseling before you get pregnant. Take 400 to 800 micrograms (mcg) of folic acid every day if you become pregnant. Ask for birth control (contraception) if you want to prevent pregnancy. Osteoporosis and menopause Osteoporosis is a disease in which the bones lose minerals and strength with aging. This can result in bone fractures. If you are 51 years old or older, or if you are at risk for osteoporosis and fractures, ask your health care provider if you should: Be screened for bone loss. Take a calcium or vitamin D supplement to lower your risk of fractures. Be given hormone replacement therapy (HRT) to treat symptoms of menopause. Follow these instructions at home: Lifestyle Do not use any products that contain nicotine or tobacco, such as cigarettes, e-cigarettes, and chewing tobacco. If you need help quitting, ask your health care provider. Do not use street drugs. Do not share needles. Ask your health care provider for help if you need support or information about quitting drugs. Alcohol use Do not drink alcohol if: Your health care provider tells you not to drink. You are pregnant, may be pregnant, or are planning to become pregnant. If you drink alcohol: Limit how much you use to 0-1 drink a day. Limit intake if you are breastfeeding. Be aware of how much alcohol is in your drink. In the [place] one drink equals one 12 oz bottle of beer (355 mL), one 5 oz glass of wine (148 mL), or one 1 oz glass of hard liquor (44 mL). General instructions Schedule regular health, dental, and eye exams. Stay current with your vaccines. Tell your health care provider if: You often feel depressed. You have ever been abused or do not feel safe at home. Summary Adopting a  healthy lifestyle and getting preventive care are important in promoting health and wellness. Follow your health care provider's instructions about healthy diet, exercising, and getting tested or screened for diseases. Follow your health care provider's instructions on monitoring your cholesterol and blood pressure. This information is not intended to replace advice given to you by your health care provider. Make sure you discuss any questions you have with your healthcare provider. Document Revised: 10/15/2018 Document Reviewed: 10/15/2018 Elsevier Patient Education  2022 [employer].

## 2021-04-15 DIAGNOSIS — Z0000 Encounter for general adult medical examination without abnormal findings: Secondary | ICD-10-CM

## 2021-04-15 DIAGNOSIS — K921 Melena: Secondary | ICD-10-CM | POA: Insufficient documentation

## 2021-04-15 DIAGNOSIS — Z0001 Encounter for general adult medical examination with abnormal findings: Secondary | ICD-10-CM | POA: Insufficient documentation

## 2021-04-15 HISTORY — DX: Encounter for general adult medical examination without abnormal findings: Z00.00

## 2021-04-15 NOTE — Assessment & Plan Note (Signed)
Episode last Fall of blood in stool for several weeks, has since resolved. Not consistent with melena.  Check H pylori stool Ag and fecal calprotectin and refer back to GI.

## 2021-04-15 NOTE — Assessment & Plan Note (Signed)
Preventative protocols reviewed and updated unless pt declined. Discussed healthy diet and lifestyle.  

## 2021-04-15 NOTE — Assessment & Plan Note (Signed)
Non reproducible pain post COVID - anticipate GI related. Will send H pylori stool Ag test, start PPI after collecting.

## 2021-04-15 NOTE — Assessment & Plan Note (Addendum)
H/o this, was thyroid replacement for a short period. Update TFTs next labs.

## 2021-04-15 NOTE — Assessment & Plan Note (Signed)
Reviewed iron panel, LFTs, CBC.

## 2021-04-15 NOTE — Assessment & Plan Note (Addendum)
See above. Anticipate component of gastritis. Check H pylori then restart PPI for 3-4 wks.  Check fecal calprotectin in h/o bloody stools Fall 2021.  Refer back to GI for further evaluation.

## 2021-04-15 NOTE — Assessment & Plan Note (Signed)
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

## 2021-04-15 NOTE — Assessment & Plan Note (Signed)
Regularly sees GYN, recent paps all normal.

## 2021-04-24 ENCOUNTER — Other Ambulatory Visit: Payer: BC Managed Care – PPO

## 2021-04-24 DIAGNOSIS — K921 Melena: Secondary | ICD-10-CM

## 2021-04-27 ENCOUNTER — Encounter: Payer: Self-pay | Admitting: Family Medicine

## 2021-04-29 LAB — CALPROTECTIN: Calprotectin: 12 mcg/g

## 2021-04-29 LAB — HELICOBACTER PYLORI  SPECIAL ANTIGEN
MICRO NUMBER:: 12026985
SPECIMEN QUALITY: ADEQUATE

## 2021-05-10 ENCOUNTER — Encounter: Payer: Self-pay | Admitting: Family Medicine

## 2021-05-10 DIAGNOSIS — R110 Nausea: Secondary | ICD-10-CM

## 2021-05-10 DIAGNOSIS — R1013 Epigastric pain: Secondary | ICD-10-CM

## 2021-05-11 MED ORDER — PANTOPRAZOLE SODIUM 40 MG PO TBEC: 40.0000 mg | DELAYED_RELEASE_TABLET | Freq: Every day | ORAL | 3 refills | Status: DC

## 2021-05-11 NOTE — Addendum Note (Signed)
Addended by: Eustaquio Boyden on: 05/11/2021 08:17 PM   Modules accepted: Orders

## 2021-05-15 ENCOUNTER — Telehealth: Payer: Self-pay | Admitting: *Deleted

## 2021-05-15 NOTE — Telephone Encounter (Signed)
Patient called stating that her prescriptions for pantoprazole was sent to [facility] and her insurance will not cover it. Patient stated that she wants it transferred to [facility] because she is going to use a GoodRx card. Patient was advised that she should be able to call [facility] and get them to call [facility] and get the script and refills transferred to them. Patient stated that she will do that.

## 2021-05-28 DIAGNOSIS — Z20822 Contact with and (suspected) exposure to covid-19: Secondary | ICD-10-CM | POA: Diagnosis not present

## 2021-05-31 ENCOUNTER — Ambulatory Visit (HOSPITAL_COMMUNITY): Payer: BC Managed Care – PPO

## 2021-06-21 ENCOUNTER — Ambulatory Visit: Payer: BC Managed Care – PPO | Admitting: Physician Assistant

## 2021-06-27 DIAGNOSIS — D2262 Melanocytic nevi of left upper limb, including shoulder: Secondary | ICD-10-CM | POA: Diagnosis not present

## 2021-06-27 DIAGNOSIS — D485 Neoplasm of uncertain behavior of skin: Secondary | ICD-10-CM | POA: Diagnosis not present

## 2021-06-30 ENCOUNTER — Ambulatory Visit: Payer: BC Managed Care – PPO | Admitting: Nurse Practitioner

## 2021-08-04 ENCOUNTER — Encounter: Payer: Self-pay | Admitting: Family Medicine

## 2021-08-15 DIAGNOSIS — Z23 Encounter for immunization: Secondary | ICD-10-CM | POA: Diagnosis not present

## 2021-10-04 DIAGNOSIS — N898 Other specified noninflammatory disorders of vagina: Secondary | ICD-10-CM | POA: Diagnosis not present

## 2021-10-04 DIAGNOSIS — R102 Pelvic and perineal pain: Secondary | ICD-10-CM | POA: Diagnosis not present

## 2021-10-05 DIAGNOSIS — R1032 Left lower quadrant pain: Secondary | ICD-10-CM | POA: Diagnosis not present

## 2021-10-24 ENCOUNTER — Ambulatory Visit: Payer: BC Managed Care – PPO | Admitting: Family Medicine

## 2021-10-24 ENCOUNTER — Other Ambulatory Visit: Payer: Self-pay

## 2021-10-24 ENCOUNTER — Encounter: Payer: Self-pay | Admitting: Family Medicine

## 2021-10-24 VITALS — BP 118/90 | HR 72 | Temp 98.2°F | Ht 63.5 in | Wt 163.4 lb

## 2021-10-24 DIAGNOSIS — J029 Acute pharyngitis, unspecified: Secondary | ICD-10-CM | POA: Diagnosis not present

## 2021-10-24 LAB — POCT RAPID STREP A (OFFICE): Rapid Strep A Screen: NEGATIVE

## 2021-10-24 NOTE — Assessment & Plan Note (Signed)
Negative strep test.  Treat with symptomatic care, rest, fluids , time.   If not improving consider eval for mononucleosis.

## 2021-10-24 NOTE — Patient Instructions (Signed)
Use tylenol or aleve for sore throat.  Rest and fluids.

## 2021-10-24 NOTE — Progress Notes (Signed)
Patient ID: Martha Coleman, female    DOB: 10-19-1986, 35 y.o.   MRN: [id number]  This visit was conducted in person.  BP 118/90    Pulse 72    Temp 98.2 F (36.8 C) (Temporal)    Ht 5' 3.5" (1.613 m)    Wt 163 lb 6 oz (74.1 kg)    LMP 10/18/2021    SpO2 98%    BMI 28.49 kg/m    CC:  Chief Complaint  Patient presents with   Sore Throat    X 1 week-Home Covid Test Negative this morning   Fatigue    Subjective:   HPI: [name] is a 35 y.o.  COVID unvaccinated female  patient of Dr. Nicanor Alcon  with history of  overweight state presenting on 10/24/2021 for Sore Throat (X 1 week-Home Covid Test Negative this morning) and Fatigue  Date of onset: 12/13  Started with ST  , scratchy throat.. has progressed to more severe ST. Feeling tired overall.  No congestion no runny nose. No cough.  Mild post nasal drip. No ear pain  No fever  No SOB.  No body ache.   Has been using aleve.. has helped some.    Non smoker  COVID 19 screen COVID testing: 12/20 negative home test COVID vaccine:none, Flu vaccine:  in 08/2021 COVID exposure: No recent travel or known exposure to COVID19 ( few weeks ago)  The importance of social distancing was discussed today.   Outpatient Medications Prior to Visit  Medication Sig Dispense Refill   Cetirizine HCl (ZYRTEC ALLERGY PO) Take by mouth.     naproxen sodium (ALEVE) 220 MG tablet Take 220 mg by mouth daily as needed.     spironolactone (ALDACTONE) 50 MG tablet Take 50 mg by mouth daily. with food     pantoprazole (PROTONIX) 40 MG tablet Take 1 tablet (40 mg total) by mouth daily. For 3 weeks then daily as needed 30 tablet 3   No facility-administered medications prior to visit.     Per HPI unless specifically indicated in ROS section below Review of Systems  Constitutional:  Negative for fatigue and fever.  HENT:  Positive for sore throat. Negative for ear pain.   Eyes:  Negative for pain.  Respiratory:  Negative for chest  tightness and shortness of breath.   Cardiovascular:  Negative for chest pain, palpitations and leg swelling.  Gastrointestinal:  Negative for abdominal pain.  Genitourinary:  Negative for dysuria.  Objective:  BP 118/90    Pulse 72    Temp 98.2 F (36.8 C) (Temporal)    Ht 5' 3.5" (1.613 m)    Wt 163 lb 6 oz (74.1 kg)    LMP 10/18/2021    SpO2 98%    BMI 28.49 kg/m   Wt Readings from Last 3 Encounters:  10/24/21 163 lb 6 oz (74.1 kg)  04/14/21 163 lb 1 oz (74 kg)  01/23/21 160 lb (72.6 kg)      Physical Exam Constitutional:      General: She is not in acute distress.    Appearance: Normal appearance. She is well-developed. She is not ill-appearing or toxic-appearing.  HENT:     Head: Normocephalic.     Right Ear: Hearing, ear canal and external ear normal. Tympanic membrane is not erythematous, retracted or bulging.     Left Ear: Hearing, tympanic membrane, ear canal and external ear normal. Tympanic membrane is not erythematous, retracted or bulging.  Nose: No mucosal edema or rhinorrhea.     Right Sinus: No maxillary sinus tenderness or frontal sinus tenderness.     Left Sinus: No maxillary sinus tenderness or frontal sinus tenderness.     Mouth/Throat:     Mouth: No oral lesions.     Pharynx: Uvula midline. Posterior oropharyngeal erythema present. No pharyngeal swelling, oropharyngeal exudate or uvula swelling.     Tonsils: No tonsillar exudate or tonsillar abscesses. 0 on the right. 0 on the left.  Eyes:     General: Lids are normal. Lids are everted, no foreign bodies appreciated.     Conjunctiva/sclera: Conjunctivae normal.     Pupils: Pupils are equal, round, and reactive to light.  Neck:     Thyroid: No thyroid mass or thyromegaly.     Vascular: No carotid bruit.     Trachea: Trachea normal.  Cardiovascular:     Rate and Rhythm: Normal rate and regular rhythm.     Pulses: Normal pulses.     Heart sounds: Normal heart sounds, S1 normal and S2 normal. No murmur  heard.   No friction rub. No gallop.  Pulmonary:     Effort: Pulmonary effort is normal. No tachypnea or respiratory distress.     Breath sounds: Normal breath sounds. No decreased breath sounds, wheezing, rhonchi or rales.  Abdominal:     General: Bowel sounds are normal.     Palpations: Abdomen is soft.     Tenderness: There is no abdominal tenderness.  Musculoskeletal:     Cervical back: Normal range of motion and neck supple.  Skin:    General: Skin is warm and dry.     Findings: No rash.  Neurological:     Mental Status: She is alert.  Psychiatric:        Mood and Affect: Mood is not anxious or depressed.        Speech: Speech normal.        Behavior: Behavior normal. Behavior is cooperative.        Thought Content: Thought content normal.        Judgment: Judgment normal.      Results for orders placed or performed in visit on 04/24/21  Helicobacter pylori special antigen   Specimen: Stool  Result Value Ref Range   MICRO NUMBER: [id number]    SPECIMEN QUALITY Adequate    SOURCE: STOOL    STATUS: FINAL    RESULT:      Not Detected  Antimicrobials, proton pump inhibitors, and bismuth preparations inhibit H. pylori and ingestion up to two weeks prior to testing may cause false negative results. If clinically indicated the test should be repeated on a new specimen  obtained two weeks after discontinuing treatment.   CALPROTECTIN   Specimen: Stool  Result Value Ref Range   Calprotectin 12 mcg/g    This visit occurred during the SARS-CoV-2 public health emergency.  Safety protocols were in place, including screening questions prior to the visit, additional usage of staff PPE, and extensive cleaning of exam room while observing appropriate contact time as indicated for disinfecting solutions.   COVID 19 screen:  No recent travel or known exposure to COVID19 The patient denies respiratory symptoms of COVID 19 at this time. The importance of social distancing was discussed  today.   Assessment and Plan Problem List Items Addressed This Visit     Sore throat - Primary    Negative strep test.  Treat with symptomatic care, rest, fluids , time.  If not improving consider eval for mononucleosis.      Relevant Orders   POCT rapid strep A (Completed)       Kerby Nora, MD

## 2021-10-31 DIAGNOSIS — Z1389 Encounter for screening for other disorder: Secondary | ICD-10-CM | POA: Diagnosis not present

## 2021-10-31 DIAGNOSIS — Z6828 Body mass index (BMI) 28.0-28.9, adult: Secondary | ICD-10-CM | POA: Diagnosis not present

## 2021-10-31 DIAGNOSIS — Z8639 Personal history of other endocrine, nutritional and metabolic disease: Secondary | ICD-10-CM | POA: Diagnosis not present

## 2021-10-31 DIAGNOSIS — Z130 Encounter for screening for diseases of the blood and blood-forming organs and certain disorders involving the immune mechanism: Secondary | ICD-10-CM | POA: Diagnosis not present

## 2021-10-31 DIAGNOSIS — Z01419 Encounter for gynecological examination (general) (routine) without abnormal findings: Secondary | ICD-10-CM | POA: Diagnosis not present

## 2021-11-10 DIAGNOSIS — Z124 Encounter for screening for malignant neoplasm of cervix: Secondary | ICD-10-CM | POA: Diagnosis not present

## 2021-11-10 DIAGNOSIS — R350 Frequency of micturition: Secondary | ICD-10-CM | POA: Diagnosis not present

## 2021-11-10 DIAGNOSIS — Z1151 Encounter for screening for human papillomavirus (HPV): Secondary | ICD-10-CM | POA: Diagnosis not present

## 2022-04-12 ENCOUNTER — Other Ambulatory Visit: Payer: Self-pay | Admitting: Family Medicine

## 2022-04-12 ENCOUNTER — Telehealth: Payer: Self-pay | Admitting: Family Medicine

## 2022-04-12 DIAGNOSIS — D751 Secondary polycythemia: Secondary | ICD-10-CM

## 2022-04-12 DIAGNOSIS — R7989 Other specified abnormal findings of blood chemistry: Secondary | ICD-10-CM

## 2022-04-12 DIAGNOSIS — Z13220 Encounter for screening for lipoid disorders: Secondary | ICD-10-CM

## 2022-04-12 DIAGNOSIS — Z148 Genetic carrier of other disease: Secondary | ICD-10-CM

## 2022-04-13 ENCOUNTER — Other Ambulatory Visit: Payer: BC Managed Care – PPO

## 2022-04-13 ENCOUNTER — Other Ambulatory Visit (INDEPENDENT_AMBULATORY_CARE_PROVIDER_SITE_OTHER): Payer: BC Managed Care – PPO

## 2022-04-13 DIAGNOSIS — D751 Secondary polycythemia: Secondary | ICD-10-CM | POA: Diagnosis not present

## 2022-04-13 DIAGNOSIS — Z148 Genetic carrier of other disease: Secondary | ICD-10-CM | POA: Diagnosis not present

## 2022-04-13 DIAGNOSIS — R7989 Other specified abnormal findings of blood chemistry: Secondary | ICD-10-CM

## 2022-04-13 DIAGNOSIS — Z13220 Encounter for screening for lipoid disorders: Secondary | ICD-10-CM

## 2022-04-13 LAB — CBC WITH DIFFERENTIAL/PLATELET
Basophils Absolute: 0.1 10*3/uL (ref 0.0–0.1)
Basophils Relative: 1 % (ref 0.0–3.0)
Eosinophils Absolute: 0.3 10*3/uL (ref 0.0–0.7)
Eosinophils Relative: 5.5 % — ABNORMAL HIGH (ref 0.0–5.0)
HCT: 42.9 % (ref 36.0–46.0)
Hemoglobin: 14.8 g/dL (ref 12.0–15.0)
Lymphocytes Relative: 24.4 % (ref 12.0–46.0)
Lymphs Abs: 1.5 10*3/uL (ref 0.7–4.0)
MCHC: 34.5 g/dL (ref 30.0–36.0)
MCV: 90.4 fl (ref 78.0–100.0)
Monocytes Absolute: 0.3 10*3/uL (ref 0.1–1.0)
Monocytes Relative: 4.4 % (ref 3.0–12.0)
Neutro Abs: 3.9 10*3/uL (ref 1.4–7.7)
Neutrophils Relative %: 64.7 % (ref 43.0–77.0)
Platelets: 256 10*3/uL (ref 150.0–400.0)
RBC: 4.74 Mil/uL (ref 3.87–5.11)
RDW: 12.4 % (ref 11.5–15.5)
WBC: 6 10*3/uL (ref 4.0–10.5)

## 2022-04-13 LAB — COMPREHENSIVE METABOLIC PANEL
ALT: 13 U/L (ref 0–35)
AST: 12 U/L (ref 0–37)
Albumin: 4.2 g/dL (ref 3.5–5.2)
Alkaline Phosphatase: 41 U/L (ref 39–117)
BUN: 12 mg/dL (ref 6–23)
CO2: 27 mEq/L (ref 19–32)
Calcium: 9.2 mg/dL (ref 8.4–10.5)
Chloride: 107 mEq/L (ref 96–112)
Creatinine, Ser: 0.91 mg/dL (ref 0.40–1.20)
GFR: 81.93 mL/min (ref >60.00)
Glucose, Bld: 81 mg/dL (ref 70–99)
Potassium: 4.8 mEq/L (ref 3.5–5.1)
Sodium: 140 mEq/L (ref 135–145)
Total Bilirubin: 0.6 mg/dL (ref 0.2–1.2)
Total Protein: 6.6 g/dL (ref 6.0–8.3)

## 2022-04-13 LAB — IBC PANEL
Iron: 82 ug/dL (ref 42–145)
Saturation Ratios: 22.8 % (ref 20.0–50.0)
TIBC: 359.8 ug/dL (ref 250.0–450.0)
Transferrin: 257 mg/dL (ref 212.0–360.0)

## 2022-04-13 LAB — LIPID PANEL
Cholesterol: 190 mg/dL (ref 0–200)
HDL: 47.6 mg/dL (ref >39.00)
LDL Cholesterol: 127 mg/dL — ABNORMAL HIGH (ref 0–99)
NonHDL: 142.12
Total CHOL/HDL Ratio: 4
Triglycerides: 77 mg/dL (ref 0.0–149.0)
VLDL: 15.4 mg/dL (ref 0.0–40.0)

## 2022-04-13 LAB — T4, FREE: Free T4: 0.73 ng/dL (ref 0.60–1.60)

## 2022-04-13 LAB — TSH: TSH: 4.29 u[IU]/mL (ref 0.35–5.50)

## 2022-04-13 LAB — FERRITIN: Ferritin: 22.8 ng/mL (ref 10.0–291.0)

## 2022-04-20 ENCOUNTER — Ambulatory Visit (INDEPENDENT_AMBULATORY_CARE_PROVIDER_SITE_OTHER): Payer: BC Managed Care – PPO | Admitting: Family Medicine

## 2022-04-20 ENCOUNTER — Encounter: Payer: Self-pay | Admitting: Family Medicine

## 2022-04-20 VITALS — BP 120/78 | HR 84 | Temp 97.2°F | Ht 63.5 in | Wt 166.0 lb

## 2022-04-20 DIAGNOSIS — Z8349 Family history of other endocrine, nutritional and metabolic diseases: Secondary | ICD-10-CM

## 2022-04-20 DIAGNOSIS — E663 Overweight: Secondary | ICD-10-CM | POA: Diagnosis not present

## 2022-04-20 DIAGNOSIS — Z0000 Encounter for general adult medical examination without abnormal findings: Secondary | ICD-10-CM | POA: Diagnosis not present

## 2022-04-20 DIAGNOSIS — R7989 Other specified abnormal findings of blood chemistry: Secondary | ICD-10-CM | POA: Diagnosis not present

## 2022-04-20 NOTE — Assessment & Plan Note (Signed)
Ferritin, % saturation, LFTs normal.

## 2022-04-20 NOTE — Assessment & Plan Note (Signed)
TSH, fT4 normal today.  

## 2022-04-20 NOTE — Patient Instructions (Addendum)
You are doing well today!  Return as needed or in 1 year for next physical.   Health Maintenance, Female Adopting a healthy lifestyle and getting preventive care are important in promoting health and wellness. Ask your health care provider about: The right schedule for you to have regular tests and exams. Things you can do on your own to prevent diseases and keep yourself healthy. What should I know about diet, weight, and exercise? Eat a healthy diet  Eat a diet that includes plenty of vegetables, fruits, low-fat dairy products, and lean protein. Do not eat a lot of foods that are high in solid fats, added sugars, or sodium. Maintain a healthy weight Body mass index (BMI) is used to identify weight problems. It estimates body fat based on height and weight. Your health care provider can help determine your BMI and help you achieve or maintain a healthy weight. Get regular exercise Get regular exercise. This is one of the most important things you can do for your health. Most adults should: Exercise for at least 150 minutes each week. The exercise should increase your heart rate and make you sweat (moderate-intensity exercise). Do strengthening exercises at least twice a week. This is in addition to the moderate-intensity exercise. Spend less time sitting. Even light physical activity can be beneficial. Watch cholesterol and blood lipids Have your blood tested for lipids and cholesterol at 35 years of age, then have this test every 5 years. Have your cholesterol levels checked more often if: Your lipid or cholesterol levels are high. You are older than 35 years of age. You are at high risk for heart disease. What should I know about cancer screening? Depending on your health history and family history, you may need to have cancer screening at various ages. This may include screening for: Breast cancer. Cervical cancer. Colorectal cancer. Skin cancer. Lung cancer. What should I know  about heart disease, diabetes, and high blood pressure? Blood pressure and heart disease High blood pressure causes heart disease and increases the risk of stroke. This is more likely to develop in people who have high blood pressure readings or are overweight. Have your blood pressure checked: Every 3-5 years if you are 25-3 years of age. Every year if you are 42 years old or older. Diabetes Have regular diabetes screenings. This checks your fasting blood sugar level. Have the screening done: Once every three years after age 88 if you are at a normal weight and have a low risk for diabetes. More often and at a younger age if you are overweight or have a high risk for diabetes. What should I know about preventing infection? Hepatitis B If you have a higher risk for hepatitis B, you should be screened for this virus. Talk with your health care provider to find out if you are at risk for hepatitis B infection. Hepatitis C Testing is recommended for: Everyone born from [birth year] through 1964. Anyone with known risk factors for hepatitis C. Sexually transmitted infections (STIs) Get screened for STIs, including gonorrhea and chlamydia, if: You are sexually active and are younger than 35 years of age. You are older than 35 years of age and your health care provider tells you that you are at risk for this type of infection. Your sexual activity has changed since you were last screened, and you are at increased risk for chlamydia or gonorrhea. Ask your health care provider if you are at risk. Ask your health care provider about whether you are  at high risk for HIV. Your health care provider may recommend a prescription medicine to help prevent HIV infection. If you choose to take medicine to prevent HIV, you should first get tested for HIV. You should then be tested every 3 months for as long as you are taking the medicine. Pregnancy If you are about to stop having your period (premenopausal) and you  may become pregnant, seek counseling before you get pregnant. Take 400 to 800 micrograms (mcg) of folic acid every day if you become pregnant. Ask for birth control (contraception) if you want to prevent pregnancy. Osteoporosis and menopause Osteoporosis is a disease in which the bones lose minerals and strength with aging. This can result in bone fractures. If you are 32 years old or older, or if you are at risk for osteoporosis and fractures, ask your health care provider if you should: Be screened for bone loss. Take a calcium or vitamin D supplement to lower your risk of fractures. Be given hormone replacement therapy (HRT) to treat symptoms of menopause. Follow these instructions at home: Alcohol use Do not drink alcohol if: Your health care provider tells you not to drink. You are pregnant, may be pregnant, or are planning to become pregnant. If you drink alcohol: Limit how much you have to: 0-1 drink a day. Know how much alcohol is in your drink. In the [place] one drink equals one 12 oz bottle of beer (355 mL), one 5 oz glass of wine (148 mL), or one 1 oz glass of hard liquor (44 mL). Lifestyle Do not use any products that contain nicotine or tobacco. These products include cigarettes, chewing tobacco, and vaping devices, such as e-cigarettes. If you need help quitting, ask your health care provider. Do not use street drugs. Do not share needles. Ask your health care provider for help if you need support or information about quitting drugs. General instructions Schedule regular health, dental, and eye exams. Stay current with your vaccines. Tell your health care provider if: You often feel depressed. You have ever been abused or do not feel safe at home. Summary Adopting a healthy lifestyle and getting preventive care are important in promoting health and wellness. Follow your health care provider's instructions about healthy diet, exercising, and getting tested or screened for  diseases. Follow your health care provider's instructions on monitoring your cholesterol and blood pressure. This information is not intended to replace advice given to you by your health care provider. Make sure you discuss any questions you have with your health care provider. Document Revised: 03/13/2021 Document Reviewed: 03/13/2021 Elsevier Patient Education  [place].

## 2022-04-20 NOTE — Progress Notes (Signed)
Patient ID: Martha Coleman, female    DOB: 08-18-1986, 36 y.o.   MRN: [id number]  This visit was conducted in person.  BP 120/78   Pulse 84   Temp (!) 97.2 F (36.2 C) (Temporal)   Ht 5' 3.5" (1.613 m)   Wt 166 lb (75.3 kg)   LMP 04/01/2022   SpO2 98%   BMI 28.94 kg/m    CC: CPE Subjective:   HPI: Martha Coleman is a 36 y.o. 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 Has seen neuro Dr Lucia Gaskins.   Preventative: Well woman - through [facility] GYN Dr Ellyn Hack. Normal paps in the past. Taking amitriptyline for IC through GYN.  Breast cancer screening - no fmhx breast cancer.  DEXA scan - not due Flu shot yearly COVID vaccine - declined Completed Hep B series  Tdap 2012, 2017 Seat belt use discussed  Sunscreen use discussed. No changing moles on skin.  Sleep - averaging 7 hours/night Non smoker  Alcohol - seldom  Dentist - yearly  Eye exam - yearly    Lives at home with family Right handed Caffeine: maybe 1 cup/day Occ: works at dermatology office Activity: works out a few times a week  Diet: good water, fruits/vegetables daily      Relevant past medical, surgical, family and social history reviewed and updated as indicated. Interim medical history since our last visit reviewed. Allergies and medications reviewed and updated. Outpatient Medications Prior to Visit  Medication Sig Dispense Refill   amitriptyline (ELAVIL) 10 MG tablet Take 10 mg by mouth daily. As needed     Cetirizine HCl (ZYRTEC ALLERGY PO) Take by mouth.     spironolactone (ALDACTONE) 100 MG tablet Take 100 mg by mouth daily. With food     naproxen sodium (ALEVE) 220 MG tablet Take 220 mg by mouth daily as needed.     spironolactone (ALDACTONE) 50 MG tablet Take 50 mg by mouth daily. with food     No facility-administered medications prior to visit.     Per HPI unless specifically indicated in ROS section below Review of  Systems  Constitutional:  Negative for activity change, appetite change, chills, fatigue, fever and unexpected weight change.  HENT:  Negative for hearing loss.   Eyes:  Negative for visual disturbance.  Respiratory:  Negative for cough, chest tightness, shortness of breath and wheezing.   Cardiovascular:  Negative for chest pain, palpitations and leg swelling.  Gastrointestinal:  Negative for abdominal distention, abdominal pain, blood in stool, constipation, diarrhea, nausea and vomiting.  Genitourinary:  Negative for difficulty urinating and hematuria.  Musculoskeletal:  Negative for arthralgias, myalgias and neck pain.  Skin:  Negative for rash.  Neurological:  Negative for dizziness, seizures, syncope and headaches.  Hematological:  Negative for adenopathy. Does not bruise/bleed easily.  Psychiatric/Behavioral:  Negative for dysphoric mood. The patient is not nervous/anxious.     Objective:  BP 120/78   Pulse 84   Temp (!) 97.2 F (36.2 C) (Temporal)   Ht 5' 3.5" (1.613 m)   Wt 166 lb (75.3 kg)   LMP 04/01/2022   SpO2 98%   BMI 28.94 kg/m   Wt Readings from Last 3 Encounters:  04/20/22 166 lb (75.3 kg)  10/24/21 163 lb 6 oz (74.1 kg)  04/14/21 163 lb 1 oz (74 kg)      Physical Exam Vitals and nursing note reviewed.  Constitutional:  Appearance: Normal appearance. She is not ill-appearing.  HENT:     Head: Normocephalic and atraumatic.     Right Ear: Tympanic membrane, ear canal and external ear normal. There is no impacted cerumen.     Left Ear: Tympanic membrane, ear canal and external ear normal. There is no impacted cerumen.  Eyes:     General:        Right eye: No discharge.        Left eye: No discharge.     Extraocular Movements: Extraocular movements intact.     Conjunctiva/sclera: Conjunctivae normal.     Pupils: Pupils are equal, round, and reactive to light.  Neck:     Thyroid: No thyroid mass or thyromegaly.  Cardiovascular:     Rate and Rhythm:  Normal rate and regular rhythm.     Pulses: Normal pulses.     Heart sounds: Normal heart sounds. No murmur heard. Pulmonary:     Effort: Pulmonary effort is normal. No respiratory distress.     Breath sounds: Normal breath sounds. No wheezing, rhonchi or rales.  Abdominal:     General: Bowel sounds are normal. There is no distension.     Palpations: Abdomen is soft. There is no mass.     Tenderness: There is no abdominal tenderness. There is no guarding or rebound.     Hernia: No hernia is present.  Musculoskeletal:     Cervical back: Normal range of motion and neck supple. No rigidity.     Right lower leg: No edema.     Left lower leg: No edema.  Lymphadenopathy:     Cervical: No cervical adenopathy.  Skin:    General: Skin is warm and dry.     Findings: No rash.  Neurological:     General: No focal deficit present.     Mental Status: She is alert. Mental status is at baseline.  Psychiatric:        Mood and Affect: Mood normal.        Behavior: Behavior normal.       Results for orders placed or performed in visit on 04/13/22  CBC with Differential/Platelet  Result Value Ref Range   WBC 6.0 4.0 - 10.5 K/uL   RBC 4.74 3.87 - 5.11 Mil/uL   Hemoglobin 14.8 12.0 - 15.0 g/dL   HCT 28.4 13.2 - 44.0 %   MCV 90.4 78.0 - 100.0 fl   MCHC 34.5 30.0 - 36.0 g/dL   RDW 10.2 72.5 - 36.6 %   Platelets 256.0 150.0 - 400.0 K/uL   Neutrophils Relative % 64.7 43.0 - 77.0 %   Lymphocytes Relative 24.4 12.0 - 46.0 %   Monocytes Relative 4.4 3.0 - 12.0 %   Eosinophils Relative 5.5 (H) 0.0 - 5.0 %   Basophils Relative 1.0 0.0 - 3.0 %   Neutro Abs 3.9 1.4 - 7.7 K/uL   Lymphs Abs 1.5 0.7 - 4.0 K/uL   Monocytes Absolute 0.3 0.1 - 1.0 K/uL   Eosinophils Absolute 0.3 0.0 - 0.7 K/uL   Basophils Absolute 0.1 0.0 - 0.1 K/uL  Ferritin  Result Value Ref Range   Ferritin 22.8 10.0 - 291.0 ng/mL  IBC panel  Result Value Ref Range   Iron 82 42 - 145 ug/dL   Transferrin 440.3 474.2 - 360.0 mg/dL    Saturation Ratios 59.5 20.0 - 50.0 %   TIBC 359.8 250.0 - 450.0 mcg/dL  Lipid panel  Result Value Ref Range   Cholesterol 190  0 - 200 mg/dL   Triglycerides 66.5 0.0 - 149.0 mg/dL   HDL 99.35 >70.17 mg/dL   VLDL 79.3 0.0 - 90.3 mg/dL   LDL Cholesterol 009 (H) 0 - 99 mg/dL   Total CHOL/HDL Ratio 4    NonHDL 142.12   Comprehensive metabolic panel  Result Value Ref Range   Sodium 140 135 - 145 mEq/L   Potassium 4.8 3.5 - 5.1 mEq/L   Chloride 107 96 - 112 mEq/L   CO2 27 19 - 32 mEq/L   Glucose, Bld 81 70 - 99 mg/dL   BUN 12 6 - 23 mg/dL   Creatinine, Ser 2.33 0.40 - 1.20 mg/dL   Total Bilirubin 0.6 0.2 - 1.2 mg/dL   Alkaline Phosphatase 41 39 - 117 U/L   AST 12 0 - 37 U/L   ALT 13 0 - 35 U/L   Total Protein 6.6 6.0 - 8.3 g/dL   Albumin 4.2 3.5 - 5.2 g/dL   GFR 00.76 >22.63 mL/min   Calcium 9.2 8.4 - 10.5 mg/dL  TSH  Result Value Ref Range   TSH 4.29 0.35 - 5.50 uIU/mL  T4, free  Result Value Ref Range   Free T4 0.73 0.60 - 1.60 ng/dL    Assessment & Plan:   Problem List Items Addressed This Visit     Health maintenance examination - Primary (Chronic)    Preventative protocols reviewed and updated unless pt declined. Discussed healthy diet and lifestyle.       Overweight (BMI 25.0-29.9)    Discussed healthy diet and lifestyle choices to affect sustainable weight loss. Recently notes more difficulty with losing weight.       Family history of hemochromatosis    Ferritin, % saturation, LFTs normal.       Abnormal TSH    TSH, fT4 normal today.         No orders of the defined types were placed in this encounter.  No orders of the defined types were placed in this encounter.    Patient instructions: You are doing well today!  Return as needed or in 1 year for next physical.   Follow up plan: Return in about 1 year (around 04/21/2023), or if symptoms worsen or fail to improve, for annual exam, prior fasting for blood work.  Eustaquio Boyden, MD

## 2022-04-20 NOTE — Assessment & Plan Note (Signed)
Discussed healthy diet and lifestyle choices to affect sustainable weight loss. Recently notes more difficulty with losing weight.

## 2022-04-20 NOTE — Assessment & Plan Note (Signed)
Preventative protocols reviewed and updated unless pt declined. Discussed healthy diet and lifestyle.  

## 2022-04-23 ENCOUNTER — Ambulatory Visit (INDEPENDENT_AMBULATORY_CARE_PROVIDER_SITE_OTHER)
Admission: RE | Admit: 2022-04-23 | Discharge: 2022-04-23 | Disposition: A | Discharge status: Home / Self Care | Payer: BC Managed Care – PPO | Source: Ambulatory Visit | Attending: Nurse Practitioner | Admitting: Nurse Practitioner

## 2022-04-23 ENCOUNTER — Ambulatory Visit: Payer: BC Managed Care – PPO | Admitting: Nurse Practitioner

## 2022-04-23 ENCOUNTER — Encounter: Payer: Self-pay | Admitting: Nurse Practitioner

## 2022-04-23 VITALS — BP 128/82 | HR 87 | Temp 97.4°F | Resp 10 | Ht 63.5 in | Wt 166.4 lb

## 2022-04-23 DIAGNOSIS — R0982 Postnasal drip: Secondary | ICD-10-CM | POA: Insufficient documentation

## 2022-04-23 DIAGNOSIS — R051 Acute cough: Secondary | ICD-10-CM | POA: Insufficient documentation

## 2022-04-23 DIAGNOSIS — J069 Acute upper respiratory infection, unspecified: Secondary | ICD-10-CM | POA: Diagnosis not present

## 2022-04-23 DIAGNOSIS — R059 Cough, unspecified: Secondary | ICD-10-CM | POA: Diagnosis not present

## 2022-04-23 HISTORY — DX: Acute upper respiratory infection, unspecified: J06.9

## 2022-04-23 MED ORDER — FLUTICASONE PROPIONATE 50 MCG/ACT NA SUSP: 2.0000 | Freq: Every day | NASAL | 0 refills | Status: DC

## 2022-04-23 MED ORDER — BENZONATATE 100 MG PO CAPS: 200.0000 mg | ORAL_CAPSULE | Freq: Three times a day (TID) | ORAL | 0 refills | Status: AC | PRN

## 2022-04-23 MED ORDER — AMOXICILLIN-POT CLAVULANATE 875-125 MG PO TABS: 1.0000 | ORAL_TABLET | Freq: Two times a day (BID) | ORAL | 0 refills | Status: AC

## 2022-04-23 NOTE — Assessment & Plan Note (Signed)
Will send in tessalon perales 200mg  TID as needed

## 2022-04-23 NOTE — Progress Notes (Signed)
Acute Office Visit  Subjective:     Patient ID: Martha Coleman, female    DOB: 02-15-1986, 36 y.o.   MRN: [id number]  Chief Complaint  Patient presents with   Cough    X 1 1/2 weeks, coughing up yellow phlegm, chest tightness, post nasal drip, some earache. No headache or sinus pain or congestion. Irritated throat. Covid test negative last one 04/22/22     Patient is in today for cough  Symptoms started approx 1.5 weeks ago. States that she was working outside and pressure washing the house. Sick contacts: daughter has had strep twice in the past month.  Covid test yesterday and was negative Has not been vaccinated against covid Has taken ibuprofen and zyrtec. With some help  Cough worse at night.  Review of Systems  Constitutional:  Positive for fever (subjective) and malaise/fatigue. Negative for chills.       Decreased appetitie Normal fluid intake  HENT:  Positive for ear pain (feels like drainage is happening). Negative for ear discharge, sinus pain and sore throat (irritatated).   Respiratory:  Positive for cough and shortness of breath. Negative for wheezing.   Cardiovascular:  Negative for chest pain.  Gastrointestinal:  Positive for nausea. Negative for abdominal pain, diarrhea and vomiting.  Musculoskeletal:  Negative for myalgias.  Neurological:  Negative for headaches.        Objective:    BP 128/82   Pulse 87   Temp (!) 97.4 F (36.3 C)   Resp 10   Ht 5' 3.5" (1.613 m)   Wt 166 lb 7 oz (75.5 kg)   LMP 04/01/2022   SpO2 99%   BMI 29.02 kg/m    Physical Exam Vitals and nursing note reviewed.  Constitutional:      Appearance: Normal appearance.  HENT:     Right Ear: Tympanic membrane, ear canal and external ear normal.     Left Ear: Tympanic membrane, ear canal and external ear normal.     Mouth/Throat:     Mouth: Mucous membranes are moist.     Pharynx: Posterior oropharyngeal erythema present.  Cardiovascular:     Rate and Rhythm: Normal  rate and regular rhythm.     Heart sounds: Normal heart sounds.  Pulmonary:     Effort: Pulmonary effort is normal.     Breath sounds: Normal breath sounds.  Lymphadenopathy:     Cervical: Cervical adenopathy present.  Neurological:     Mental Status: She is alert.     No results found for any visits on 04/23/22.      Assessment & Plan:   Problem List Items Addressed This Visit       Respiratory   Upper respiratory tract infection    Patient concerned for pna. Given the length of illness and seems to be progressing will obtain a chest xray, pending results. Will start Augmentin 875-125mg  BID for a week      Relevant Medications   amoxicillin-clavulanate (AUGMENTIN) 875-125 MG tablet   Other Relevant Orders   DG Chest 2 View     Other   PND (post-nasal drip)    Start flonase 2 sprays each nostril once a day. Gave precautions in regards to epistaxis      Relevant Medications   fluticasone (FLONASE) 50 MCG/ACT nasal spray   Acute cough - Primary    Will send in tessalon perales 200mg  TID as needed      Relevant Medications   benzonatate (TESSALON) 100 MG capsule  Other Relevant Orders   DG Chest 2 View    Meds ordered this encounter  Medications   amoxicillin-clavulanate (AUGMENTIN) 875-125 MG tablet    Sig: Take 1 tablet by mouth 2 (two) times daily for 7 days.    Dispense:  14 tablet    Refill:  0    Order Specific Question:   Supervising Provider    Answer:   Milinda Antis MARNE A [[id number]]   benzonatate (TESSALON) 100 MG capsule    Sig: Take 2 capsules (200 mg total) by mouth 3 (three) times daily as needed for up to 10 days for cough.    Dispense:  20 capsule    Refill:  0    Order Specific Question:   Supervising Provider    Answer:   Milinda Antis MARNE A [[id number]]   fluticasone (FLONASE) 50 MCG/ACT nasal spray    Sig: Place 2 sprays into both nostrils daily.    Dispense:  16 g    Refill:  0    Order Specific Question:   Supervising Provider    Answer:   TOWER,  MARNE A [[id number]]    Return if symptoms worsen or fail to improve.  Audria Nine, NP

## 2022-04-23 NOTE — Patient Instructions (Signed)
Nice to see you today I sent in 3 medications to the pharmacy. The nasal spray can cause a nose bleed so watch for that I will be in touch with the xray results once I have them Follow up if no improvement

## 2022-04-23 NOTE — Assessment & Plan Note (Signed)
Patient concerned for pna. Given the length of illness and seems to be progressing will obtain a chest xray, pending results. Will start Augmentin 875-125mg  BID for a week

## 2022-04-23 NOTE — Assessment & Plan Note (Signed)
Start flonase 2 sprays each nostril once a day. Gave precautions in regards to epistaxis

## 2022-06-22 DIAGNOSIS — N644 Mastodynia: Secondary | ICD-10-CM | POA: Diagnosis not present

## 2022-06-26 ENCOUNTER — Other Ambulatory Visit: Payer: Self-pay | Admitting: Obstetrics and Gynecology

## 2022-06-26 DIAGNOSIS — N644 Mastodynia: Secondary | ICD-10-CM

## 2022-07-03 ENCOUNTER — Ambulatory Visit
Admission: RE | Admit: 2022-07-03 | Discharge: 2022-07-03 | Disposition: A | Discharge status: Home / Self Care | Payer: BC Managed Care – PPO | Source: Ambulatory Visit | Attending: Obstetrics and Gynecology | Admitting: Obstetrics and Gynecology

## 2022-07-03 ENCOUNTER — Other Ambulatory Visit: Payer: Self-pay | Admitting: Obstetrics and Gynecology

## 2022-07-03 DIAGNOSIS — N644 Mastodynia: Secondary | ICD-10-CM | POA: Diagnosis not present

## 2022-07-03 DIAGNOSIS — N6489 Other specified disorders of breast: Secondary | ICD-10-CM

## 2022-07-05 ENCOUNTER — Other Ambulatory Visit: Payer: BC Managed Care – PPO

## 2022-07-11 ENCOUNTER — Other Ambulatory Visit: Payer: BC Managed Care – PPO

## 2022-07-20 ENCOUNTER — Ambulatory Visit
Admission: RE | Admit: 2022-07-20 | Discharge: 2022-07-20 | Disposition: A | Discharge status: Home / Self Care | Payer: BC Managed Care – PPO | Source: Ambulatory Visit | Attending: Obstetrics and Gynecology | Admitting: Obstetrics and Gynecology

## 2022-07-20 DIAGNOSIS — R928 Other abnormal and inconclusive findings on diagnostic imaging of breast: Secondary | ICD-10-CM | POA: Diagnosis not present

## 2022-07-20 DIAGNOSIS — N6489 Other specified disorders of breast: Secondary | ICD-10-CM

## 2022-07-20 DIAGNOSIS — N6011 Diffuse cystic mastopathy of right breast: Secondary | ICD-10-CM | POA: Diagnosis not present

## 2022-09-20 ENCOUNTER — Encounter: Payer: Self-pay | Admitting: Internal Medicine

## 2022-09-20 ENCOUNTER — Ambulatory Visit: Payer: BC Managed Care – PPO | Admitting: Internal Medicine

## 2022-09-20 VITALS — BP 122/82 | HR 75 | Temp 98.2°F | Resp 16 | Ht 63.5 in | Wt 165.0 lb

## 2022-09-20 DIAGNOSIS — J020 Streptococcal pharyngitis: Secondary | ICD-10-CM | POA: Diagnosis not present

## 2022-09-20 DIAGNOSIS — Z23 Encounter for immunization: Secondary | ICD-10-CM | POA: Diagnosis not present

## 2022-09-20 MED ORDER — AMOXICILLIN 875 MG PO TABS: 875.0000 mg | ORAL_TABLET | Freq: Two times a day (BID) | ORAL | 0 refills | Status: AC

## 2022-09-20 NOTE — Progress Notes (Signed)
Subjective:  Patient ID: Martha Coleman, female    DOB: [birth year]  Age: 36 y.o. MRN: [id number]  CC: Sore Throat   HPI [name] presents for f/up -  She complains of a 5-day history of sore throat with nausea, dizziness, lightheadedness, and near syncope.  At home testing was negative for COVID and pregnancy.  Her daughter was recently diagnosed and treated for strep.  Outpatient Medications Prior to Visit  Medication Sig Dispense Refill   amitriptyline (ELAVIL) 10 MG tablet Take 10 mg by mouth daily. As needed     Cetirizine HCl (ZYRTEC ALLERGY PO) Take by mouth.     fluticasone (FLONASE) 50 MCG/ACT nasal spray Place 2 sprays into both nostrils daily. 16 g 0   spironolactone (ALDACTONE) 100 MG tablet Take 100 mg by mouth daily. With food     No facility-administered medications prior to visit.    ROS Review of Systems  Constitutional:  Negative for chills, diaphoresis, fatigue and fever.  HENT:  Positive for sore throat. Negative for trouble swallowing.   Eyes: Negative.   Respiratory:  Negative for cough, chest tightness, shortness of breath and wheezing.   Cardiovascular:  Negative for chest pain, palpitations and leg swelling.  Gastrointestinal:  Positive for nausea. Negative for abdominal pain, diarrhea and vomiting.  Endocrine: Negative.   Genitourinary: Negative.  Negative for difficulty urinating.  Musculoskeletal: Negative.   Neurological:  Positive for dizziness and light-headedness.  Hematological:  Negative for adenopathy. Does not bruise/bleed easily.  Psychiatric/Behavioral: Negative.      Objective:  BP 122/82 (BP Location: Left Arm, Patient Position: Sitting, Cuff Size: Large)   Pulse 75   Temp 98.2 F (36.8 C) (Oral)   Resp 16   Ht 5' 3.5" (1.613 m)   Wt 165 lb (74.8 kg)   LMP 09/01/2022 (Exact Date)   SpO2 99%   BMI 28.77 kg/m   BP Readings from Last 3 Encounters:  09/20/22 122/82  04/23/22 128/82  04/20/22 120/78    Wt Readings  from Last 3 Encounters:  09/20/22 165 lb (74.8 kg)  04/23/22 166 lb 7 oz (75.5 kg)  04/20/22 166 lb (75.3 kg)    Physical Exam Vitals reviewed.  Constitutional:      Appearance: She is not ill-appearing.  HENT:     Mouth/Throat:     Palate: No mass.     Pharynx: Posterior oropharyngeal erythema present. No pharyngeal swelling, oropharyngeal exudate or uvula swelling.     Tonsils: No tonsillar exudate.  Eyes:     General: No scleral icterus.    Conjunctiva/sclera: Conjunctivae normal.  Cardiovascular:     Rate and Rhythm: Normal rate and regular rhythm.     Heart sounds: No murmur heard. Pulmonary:     Effort: Pulmonary effort is normal.     Breath sounds: No stridor. No wheezing, rhonchi or rales.  Abdominal:     General: Abdomen is flat.     Palpations: There is no mass.     Tenderness: There is no abdominal tenderness. There is no guarding.     Hernia: No hernia is present.  Musculoskeletal:        General: Normal range of motion.     Cervical back: Neck supple.     Right lower leg: No edema.     Left lower leg: No edema.  Lymphadenopathy:     Head:     Right side of head: No occipital adenopathy.     Left side of  head: No occipital adenopathy.     Cervical: Cervical adenopathy present.     Right cervical: Superficial cervical adenopathy present. No deep or posterior cervical adenopathy.    Left cervical: Superficial cervical adenopathy present. No deep or posterior cervical adenopathy.     Upper Body:     Right upper body: No supraclavicular or axillary adenopathy.     Left upper body: No supraclavicular or axillary adenopathy.  Skin:    General: Skin is warm and dry.  Neurological:     General: No focal deficit present.     Mental Status: She is alert.  Psychiatric:        Mood and Affect: Mood normal.        Behavior: Behavior normal.     Lab Results  Component Value Date   WBC 6.0 04/13/2022   HGB 14.8 04/13/2022   HCT 42.9 04/13/2022   PLT 256.0  04/13/2022   GLUCOSE 81 04/13/2022   CHOL 190 04/13/2022   TRIG 77.0 04/13/2022   HDL 47.60 04/13/2022   LDLCALC 127 (H) 04/13/2022   ALT 13 04/13/2022   AST 12 04/13/2022   NA 140 04/13/2022   K 4.8 04/13/2022   CL 107 04/13/2022   CREATININE 0.91 04/13/2022   BUN 12 04/13/2022   CO2 27 04/13/2022   TSH 4.29 04/13/2022    MM RT BREAST BX W LOC DEV 1ST LESION IMAGE BX SPEC STEREO GUIDE  Addendum Date: 07/24/2022   ADDENDUM REPORT: 07/24/2022 08:15 ADDENDUM: PATHOLOGY revealed: Breast, RIGHT, needle core biopsy, upper inner quadrant, coil clip- 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 - NEGATIVE FOR CARCINOMA. Pathology results are CONCORDANT with imaging findings, per Dr. Bary Richard. Pathology results and recommendations were discussed with patient via telephone on 07/23/2022. Patient reported biopsy site doing well with no adverse symptoms, yet had "small amount of bleeding post procedure day which caused steri strips to come off. Biopsy site clean and dry at present". Post biopsy care instructions were reviewed, questions were answered and my direct phone number was provided. Patient was instructed to call [facility] for any additional questions or concerns related to biopsy site. RECOMMENDATION: Patient instructed to continue with monthly self breast examinations, clinical follow-up for persistent or subsequent concerns, and begin bilateral screening mammogram at age 32. Pathology results reported by Randa Lynn RN on 07/23/2022. Electronically Signed   By: Bary Richard M.D.   On: 07/24/2022 08:15   Result Date: 07/24/2022 CLINICAL DATA:  Patient presents today for stereotactic biopsy of an asymmetry within the upper inner quadrant of the RIGHT breast at posterior depth without sonographic correlate. EXAM: RIGHT BREAST STEREOTACTIC CORE NEEDLE BIOPSY COMPARISON:  Previous exam(s). FINDINGS: The patient and I discussed the procedure of  stereotactic-guided biopsy including benefits and alternatives. We discussed the high likelihood of a successful procedure. We discussed the risks of the procedure including infection, bleeding, tissue injury, clip migration, and inadequate sampling. Informed written consent was given. The usual time out protocol was performed immediately prior to the procedure. Using sterile technique and 1% Lidocaine as local anesthetic, under stereotactic guidance, a 9 gauge vacuum assisted device was used to perform core needle biopsy of the asymmetry within the upper inner RIGHT breast using a superior approach. Lesion quadrant: Upper inner quadrant At the conclusion of the procedure, a coil shaped tissue marker clip was deployed into the biopsy cavity. Follow-up 2-view mammogram was performed and dictated separately. IMPRESSION: Stereotactic-guided biopsy of the asymmetry within the upper  inner quadrant of the RIGHT breast at posterior depth. No apparent complications. Electronically Signed: By: Bary Richard M.D. On: 07/20/2022 13:37  MM CLIP PLACEMENT RIGHT  Result Date: 07/20/2022 CLINICAL DATA:  Status post stereotactic biopsy for a developing asymmetry within the upper inner quadrant the RIGHT breast at posterior depth. EXAM: 3D DIAGNOSTIC RIGHT MAMMOGRAM POST STEREOTACTIC BIOPSY COMPARISON:  Previous exam(s). FINDINGS: 3D Mammographic images were obtained following stereotactic guided biopsy of the developing asymmetry within the upper inner quadrant of the RIGHT breast at posterior depth. The biopsy marking clip is in expected position at the site of biopsy. IMPRESSION: Appropriate positioning of the coil shaped biopsy marking clip at the site of biopsy in the upper inner quadrant of the RIGHT breast at posterior depth. Final Assessment: Post Procedure Mammograms for Marker Placement Electronically Signed   By: Bary Richard M.D.   On: 07/20/2022 13:54   Assessment & Plan:   [place] was seen today for sore  throat.  Diagnoses and all orders for this visit:  Strep sore throat- Her symptoms and exam are suspicious for strep.  She has a recent exposure.  Will treat with amoxicillin. -     amoxicillin (AMOXIL) 875 MG tablet; Take 1 tablet (875 mg total) by mouth 2 (two) times daily for 10 days.  Other orders -     Flu Vaccine QUAD 6+ mos PF IM (Fluarix Quad PF)   I am having [place] L. Suzie Portela "Leann" start on amoxicillin. I am also having her maintain her Cetirizine HCl (ZYRTEC ALLERGY PO), amitriptyline, spironolactone, and fluticasone.  Meds ordered this encounter  Medications   amoxicillin (AMOXIL) 875 MG tablet    Sig: Take 1 tablet (875 mg total) by mouth 2 (two) times daily for 10 days.    Dispense:  20 tablet    Refill:  0     Follow-up: Return if symptoms worsen or fail to improve.  Sanda Linger, MD

## 2022-09-20 NOTE — Patient Instructions (Signed)

## 2022-09-21 ENCOUNTER — Encounter: Payer: Self-pay | Admitting: Internal Medicine

## 2022-09-21 DIAGNOSIS — R300 Dysuria: Secondary | ICD-10-CM | POA: Diagnosis not present

## 2022-09-21 DIAGNOSIS — R102 Pelvic and perineal pain: Secondary | ICD-10-CM | POA: Diagnosis not present

## 2022-09-21 DIAGNOSIS — Z3202 Encounter for pregnancy test, result negative: Secondary | ICD-10-CM | POA: Diagnosis not present

## 2022-09-24 DIAGNOSIS — R300 Dysuria: Secondary | ICD-10-CM | POA: Diagnosis not present

## 2022-11-02 DIAGNOSIS — Z01419 Encounter for gynecological examination (general) (routine) without abnormal findings: Secondary | ICD-10-CM | POA: Diagnosis not present

## 2022-11-02 DIAGNOSIS — K625 Hemorrhage of anus and rectum: Secondary | ICD-10-CM | POA: Diagnosis not present

## 2022-11-02 DIAGNOSIS — M545 Low back pain, unspecified: Secondary | ICD-10-CM | POA: Diagnosis not present

## 2022-11-02 DIAGNOSIS — Z1389 Encounter for screening for other disorder: Secondary | ICD-10-CM | POA: Diagnosis not present

## 2022-11-02 DIAGNOSIS — F418 Other specified anxiety disorders: Secondary | ICD-10-CM | POA: Diagnosis not present

## 2022-11-05 HISTORY — PX: COLONOSCOPY: SHX174

## 2022-11-05 HISTORY — PX: ESOPHAGOGASTRODUODENOSCOPY: SHX1529

## 2022-11-08 DIAGNOSIS — M546 Pain in thoracic spine: Secondary | ICD-10-CM | POA: Diagnosis not present

## 2022-11-08 DIAGNOSIS — M545 Low back pain, unspecified: Secondary | ICD-10-CM | POA: Diagnosis not present

## 2022-11-08 DIAGNOSIS — R194 Change in bowel habit: Secondary | ICD-10-CM | POA: Diagnosis not present

## 2022-11-08 DIAGNOSIS — R1011 Right upper quadrant pain: Secondary | ICD-10-CM | POA: Diagnosis not present

## 2022-11-08 DIAGNOSIS — K625 Hemorrhage of anus and rectum: Secondary | ICD-10-CM | POA: Diagnosis not present

## 2022-11-08 DIAGNOSIS — K219 Gastro-esophageal reflux disease without esophagitis: Secondary | ICD-10-CM | POA: Diagnosis not present

## 2022-11-09 ENCOUNTER — Ambulatory Visit: Payer: BC Managed Care – PPO | Admitting: Family Medicine

## 2022-11-12 DIAGNOSIS — K635 Polyp of colon: Secondary | ICD-10-CM | POA: Diagnosis not present

## 2022-11-12 DIAGNOSIS — R194 Change in bowel habit: Secondary | ICD-10-CM | POA: Diagnosis not present

## 2022-11-12 DIAGNOSIS — D120 Benign neoplasm of cecum: Secondary | ICD-10-CM | POA: Diagnosis not present

## 2022-11-12 DIAGNOSIS — Z1211 Encounter for screening for malignant neoplasm of colon: Secondary | ICD-10-CM | POA: Diagnosis not present

## 2022-11-12 DIAGNOSIS — K625 Hemorrhage of anus and rectum: Secondary | ICD-10-CM | POA: Diagnosis not present

## 2022-11-12 LAB — HM COLONOSCOPY

## 2022-11-16 DIAGNOSIS — K219 Gastro-esophageal reflux disease without esophagitis: Secondary | ICD-10-CM | POA: Diagnosis not present

## 2022-11-16 DIAGNOSIS — K921 Melena: Secondary | ICD-10-CM | POA: Diagnosis not present

## 2022-11-16 DIAGNOSIS — K449 Diaphragmatic hernia without obstruction or gangrene: Secondary | ICD-10-CM | POA: Diagnosis not present

## 2022-11-16 DIAGNOSIS — K2289 Other specified disease of esophagus: Secondary | ICD-10-CM | POA: Diagnosis not present

## 2022-11-16 DIAGNOSIS — K625 Hemorrhage of anus and rectum: Secondary | ICD-10-CM | POA: Diagnosis not present

## 2022-11-16 DIAGNOSIS — K209 Esophagitis, unspecified without bleeding: Secondary | ICD-10-CM | POA: Diagnosis not present

## 2022-12-06 DIAGNOSIS — K449 Diaphragmatic hernia without obstruction or gangrene: Secondary | ICD-10-CM | POA: Diagnosis not present

## 2022-12-06 DIAGNOSIS — E669 Obesity, unspecified: Secondary | ICD-10-CM | POA: Diagnosis not present

## 2022-12-06 DIAGNOSIS — K219 Gastro-esophageal reflux disease without esophagitis: Secondary | ICD-10-CM | POA: Diagnosis not present

## 2022-12-06 DIAGNOSIS — Z86010 Personal history of colonic polyps: Secondary | ICD-10-CM | POA: Diagnosis not present

## 2022-12-14 ENCOUNTER — Encounter: Payer: Self-pay | Admitting: Family Medicine

## 2022-12-14 ENCOUNTER — Ambulatory Visit: Payer: BC Managed Care – PPO | Admitting: Family Medicine

## 2022-12-14 VITALS — BP 130/86 | HR 81 | Temp 97.8°F | Ht 63.5 in | Wt 167.1 lb

## 2022-12-14 DIAGNOSIS — T6591XA Toxic effect of unspecified substance, accidental (unintentional), initial encounter: Secondary | ICD-10-CM | POA: Diagnosis not present

## 2022-12-14 DIAGNOSIS — R42 Dizziness and giddiness: Secondary | ICD-10-CM | POA: Diagnosis not present

## 2022-12-14 NOTE — Assessment & Plan Note (Signed)
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 Eye goggles worsened symptoms. Offered allergist evaluation, suggested try mask. She will let me know if desires further evaluation.

## 2022-12-14 NOTE — Assessment & Plan Note (Signed)
Short lived episodes of vertigo lasting seconds.  Benign ear exam today  Anticipate sinus inflammation related - rec restart flonase.  Possible BPPV - handout provided as well as modified epley repositioning maneuver handout.  Symptoms/story not consistent with vestibular neuritis given short lived episodes.  Update if worsening symptoms.

## 2022-12-14 NOTE — Progress Notes (Signed)
Patient ID: Martha Coleman, female    DOB: 1986/09/15, 37 y.o.   MRN: [id number]  This visit was conducted in person.  BP 130/86   Pulse 81   Temp 97.8 F (36.6 C) (Temporal)   Ht 5' 3.5" (1.613 m)   Wt 167 lb 2 oz (75.8 kg)   LMP 12/09/2022   SpO2 99%   BMI 29.14 kg/m    CC: dizziness, R ear pain  Subjective:   HPI: Martha Coleman is a 37 y.o. female presenting on 12/14/2022 for Dizziness (C/o dizzy spells and R ear pain. Started about 1 mo ago.  )   Several mo h/o dizzy spells associated with R ear pain. Dizzy spells are short lived. Describes vertigo room spinning worse with head turn. No hearing changes, tinnitus, nausea, vomiting. No recent viral URI symptoms. She had COVID 10/2022 but above symptoms were present prior.   H/o R ear scarring from tympanostomy tubes in childhood.  She had a job change in August - now working in dermatology lab doing H&E staining (hematoxyline, D-limonine). Feeling more ill since job switch - nauseated, dizzy, eyes swelling. No tongue/lip swelling or dyspnea. Brings picture of puffy eyelids after a day spent working in her lab. No fume hood/vent in her lab.   Had EGD/colonoscopy after episode of rectal bleeding - found polyp, now having Q37yr. Will request records. Now on pantoprazole 440mdaily.      Relevant past medical, surgical, family and social history reviewed and updated as indicated. Interim medical history since our last visit reviewed. Allergies and medications reviewed and updated. Outpatient Medications Prior to Visit  Medication Sig Dispense Refill   Cetirizine HCl (ZYRTEC ALLERGY PO) Take by mouth.     pantoprazole (PROTONIX) 40 MG tablet Take 40 mg by mouth every morning.     amitriptyline (ELAVIL) 10 MG tablet Take 10 mg by mouth daily. As needed     fluticasone (FLONASE) 50 MCG/ACT nasal spray Place 2 sprays into both nostrils daily. 16 g 0   spironolactone (ALDACTONE) 100 MG tablet Take 100 mg by mouth daily. With food      No facility-administered medications prior to visit.     Per HPI unless specifically indicated in ROS section below Review of Systems  Objective:  BP 130/86   Pulse 81   Temp 97.8 F (36.6 C) (Temporal)   Ht 5' 3.5" (1.613 m)   Wt 167 lb 2 oz (75.8 kg)   LMP 12/09/2022   SpO2 99%   BMI 29.14 kg/m   Wt Readings from Last 3 Encounters:  12/14/22 167 lb 2 oz (75.8 kg)  09/20/22 165 lb (74.8 kg)  04/23/22 166 lb 7 oz (75.5 kg)      Physical Exam Vitals and nursing note reviewed.  Constitutional:      Appearance: Normal appearance. She is not ill-appearing.  HENT:     Head: Normocephalic and atraumatic.     Right Ear: Tympanic membrane, ear canal and external ear normal. There is no impacted cerumen.     Left Ear: Tympanic membrane, ear canal and external ear normal. There is no impacted cerumen.     Mouth/Throat:     Mouth: Mucous membranes are moist.     Pharynx: Oropharynx is clear. Posterior oropharyngeal erythema (mild to posterior oropharynx) present. No oropharyngeal exudate.  Eyes:     Extraocular Movements: Extraocular movements intact.     Conjunctiva/sclera: Conjunctivae normal.     Pupils: Pupils are equal,  round, and reactive to light.  Neck:     Vascular: No carotid bruit.  Cardiovascular:     Rate and Rhythm: Normal rate and regular rhythm.     Pulses: Normal pulses.     Heart sounds: Normal heart sounds. No murmur heard. Pulmonary:     Effort: Pulmonary effort is normal. No respiratory distress.     Breath sounds: Normal breath sounds. No wheezing, rhonchi or rales.  Musculoskeletal:     Cervical back: Normal range of motion and neck supple.     Right lower leg: No edema.     Left lower leg: No edema.  Lymphadenopathy:     Head:     Right side of head: No submental, submandibular, tonsillar, preauricular or posterior auricular adenopathy.     Left side of head: No submental, submandibular, tonsillar, preauricular or posterior auricular  adenopathy.     Cervical: No cervical adenopathy.     Right cervical: No superficial cervical adenopathy.    Left cervical: No superficial cervical adenopathy.     Upper Body:     Right upper body: No supraclavicular adenopathy.     Left upper body: No supraclavicular adenopathy.  Skin:    General: Skin is warm and dry.     Findings: No erythema or rash.  Neurological:     Mental Status: She is alert.     Cranial Nerves: Cranial nerves 2-12 are intact. No cranial nerve deficit.     Motor: Motor function is intact.     Coordination: Coordination is intact.     Gait: Gait is intact.     Comments:  CN 2-12 intact FTN intact EOMI Neg Dix-Hallpike  Psychiatric:        Mood and Affect: Mood normal.        Behavior: Behavior normal.         Assessment & Plan:   Problem List Items Addressed This Visit     Vertigo - Primary    Short lived episodes of vertigo lasting seconds.  Benign ear exam today  Anticipate sinus inflammation related - rec restart flonase.  Possible BPPV - handout provided as well as modified epley repositioning maneuver handout.  Symptoms/story not consistent with vestibular neuritis given short lived episodes.  Update if worsening symptoms.       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 Eye goggles worsened symptoms. Offered allergist evaluation, suggested try mask. She will let me know if desires further evaluation.         No orders of the defined types were placed in this encounter.   No orders of the defined types were placed in this encounter.   Patient Instructions  We will request recent endoscopy and colonoscopy report from Dr Collene Mares (11/2022).  Possible positional vertigo - try exercises provided today. Let us know if worsening symptoms.  Restart flonase nasal steroid.  For work exposure issue - consider mask, let me know if  interested in allergist evaluation.  Good to see you today.   Follow up plan: Return if symptoms worsen or fail to improve.  Ria Bush, MD

## 2022-12-14 NOTE — Patient Instructions (Addendum)
We will request recent endoscopy and colonoscopy report from Dr Collene Mares (11/2022).  Possible positional vertigo - try exercises provided today. Let us know if worsening symptoms.  Restart flonase nasal steroid.  For work exposure issue - consider mask, let me know if interested in allergist evaluation.  Good to see you today.

## 2022-12-20 ENCOUNTER — Encounter: Payer: Self-pay | Admitting: Family Medicine

## 2022-12-20 DIAGNOSIS — T6591XA Toxic effect of unspecified substance, accidental (unintentional), initial encounter: Secondary | ICD-10-CM

## 2022-12-22 ENCOUNTER — Encounter: Payer: Self-pay | Admitting: Family Medicine

## 2023-02-08 ENCOUNTER — Ambulatory Visit: Payer: 59 | Admitting: Allergy

## 2023-02-14 ENCOUNTER — Telehealth: Payer: Self-pay | Admitting: Family Medicine

## 2023-02-14 DIAGNOSIS — M25561 Pain in right knee: Secondary | ICD-10-CM | POA: Diagnosis not present

## 2023-02-14 DIAGNOSIS — M533 Sacrococcygeal disorders, not elsewhere classified: Secondary | ICD-10-CM | POA: Diagnosis not present

## 2023-02-14 DIAGNOSIS — Z840 Family history of diseases of the skin and subcutaneous tissue: Secondary | ICD-10-CM | POA: Diagnosis not present

## 2023-02-14 DIAGNOSIS — M25562 Pain in left knee: Secondary | ICD-10-CM | POA: Diagnosis not present

## 2023-02-14 NOTE — Telephone Encounter (Signed)
I do not see rheumatology referral from Dr. Reece Agar in pt's chart.   Spoke with Andrey Campanile of [facility] asking for clarification of message. She apologizes for the confusion. States a peer-to-peer was needed and she just had not had a chance to call back to let us know to disregard the message.   Phn note closed.

## 2023-02-14 NOTE — Telephone Encounter (Signed)
Received a call from [facility] stating this patient was referred to their office, but they have not received any records, office notes or possible xrays belonging to this patient. Stated they need these for her appointment today, please advise [phone] ext. [phone] (ask to speak with [place]). Fax: [phone].

## 2023-02-27 DIAGNOSIS — M25561 Pain in right knee: Secondary | ICD-10-CM | POA: Diagnosis not present

## 2023-02-27 DIAGNOSIS — M0609 Rheumatoid arthritis without rheumatoid factor, multiple sites: Secondary | ICD-10-CM | POA: Diagnosis not present

## 2023-02-27 DIAGNOSIS — M25562 Pain in left knee: Secondary | ICD-10-CM | POA: Diagnosis not present

## 2023-02-27 DIAGNOSIS — M533 Sacrococcygeal disorders, not elsewhere classified: Secondary | ICD-10-CM | POA: Diagnosis not present

## 2023-02-27 DIAGNOSIS — R5382 Chronic fatigue, unspecified: Secondary | ICD-10-CM | POA: Diagnosis not present

## 2023-03-03 ENCOUNTER — Other Ambulatory Visit: Payer: Self-pay | Admitting: Nurse Practitioner

## 2023-03-03 DIAGNOSIS — R0982 Postnasal drip: Secondary | ICD-10-CM

## 2023-03-06 ENCOUNTER — Other Ambulatory Visit: Payer: Self-pay | Admitting: Nurse Practitioner

## 2023-03-06 DIAGNOSIS — R0982 Postnasal drip: Secondary | ICD-10-CM

## 2023-03-06 MED ORDER — FLUTICASONE PROPIONATE 50 MCG/ACT NA SUSP: 2.0000 | Freq: Every day | NASAL | 1 refills | Status: DC

## 2023-03-21 ENCOUNTER — Other Ambulatory Visit: Payer: Self-pay | Admitting: Nurse Practitioner

## 2023-03-21 DIAGNOSIS — R0982 Postnasal drip: Secondary | ICD-10-CM

## 2023-03-22 NOTE — Telephone Encounter (Signed)
ERx 

## 2023-04-14 ENCOUNTER — Other Ambulatory Visit: Payer: Self-pay | Admitting: Family Medicine

## 2023-04-14 DIAGNOSIS — E785 Hyperlipidemia, unspecified: Secondary | ICD-10-CM

## 2023-04-14 DIAGNOSIS — R7989 Other specified abnormal findings of blood chemistry: Secondary | ICD-10-CM

## 2023-04-14 DIAGNOSIS — Z148 Genetic carrier of other disease: Secondary | ICD-10-CM

## 2023-04-14 NOTE — Addendum Note (Signed)
Addended by: Eustaquio Boyden on: 04/14/2023 10:10 PM   Modules accepted: Orders

## 2023-04-18 NOTE — Progress Notes (Deleted)
Initial neurology clinic note  Martha Coleman MRN: [id number] DOB: 1986-06-20  Referring provider: Eustaquio Boyden, MD  Primary care provider: Eustaquio Boyden, MD  Reason for consult:  headaches  Subjective:  This is [name], a 37 y.o. ***-handed female with a medical history of GERD, former smoker*** who presents to neurology clinic with ***. The patient is accompanied by ***.  Headache history (type of pain, location, radiation/spread, associated symptoms including neck pain***): ***  How severe? How fast?: ***  How long does headache last? ***  How often to you have headaches (how many attacks, how many days in 1 month)? ***  Do you ever have complete headache freedom? How many days in 1 month? ***  Do headaches occur at a particular time of day or night? ***  Do headaches wake you from sleep? ***  Are headaches present upon awakening? ***  What do you prefer to do physically when having an attack (lying down, sitting, pacing, moving)? ***  Is your skin sensitive during an attack? If so, where? ***  Warning signs before head pain or any aura starts (uncontrollable yawning, change in mood, tiredness, cognitive dysfunction, food cravings, excessive thirst or urination, neck pain, nausea, photophobia, and osmophobia): ***  Aura?: ***  Autonomic signs such as tearing, running nose, ptosis, facial pallor/flushing/sweating, aural fullness? *** Unilateral or bilateral? ***  Worse with certain positions like laying down or standing? ***  Worse with straining (coughing, sneezing, exercise, using bathroom)? ***  Do you have symptoms between attacks (pain, phtotophobia, phonophobia, osmophobia)? ***  Have you identified any triggers for your headaches? ***  Does headache impact your life even when you are not having one? ***  Family with headaches? If so, what type? ***  Social history: Caffiene use: *** Tobacco use: *** EtOH use: *** Drug use:  *** Sleep: *** Mood: ***  Current headache medications (including over the counter): ***  Previous medications tried (effectiveness, side effects, why stopped):  ***   On amitriptyline 10 mg as needed, why?*** Why aldactone?***  MEDICATIONS:  Outpatient Encounter Medications as of 04/25/2023  Medication Sig   amitriptyline (ELAVIL) 10 MG tablet Take 10 mg by mouth daily. As needed   Cetirizine HCl (ZYRTEC ALLERGY PO) Take by mouth.   fluticasone (FLONASE) 50 MCG/ACT nasal spray USE TWO SPRAYS IN EACH NOSTRIL ONE TIME DAILY   pantoprazole (PROTONIX) 40 MG tablet Take 40 mg by mouth every morning.   spironolactone (ALDACTONE) 100 MG tablet Take 100 mg by mouth daily. With food   No facility-administered encounter medications on file as of 04/25/2023.    PAST MEDICAL HISTORY: Past Medical History:  Diagnosis Date   Chest pain 07/09/2020   COVID-19 virus infection 11/2019   Genital warts    Health maintenance examination 04/15/2021   Normal pregnancy 06/12/2011   SVD (spontaneous vaginal delivery) 10/12/2016   Upper respiratory tract infection 04/23/2022    PAST SURGICAL HISTORY: Past Surgical History:  Procedure Laterality Date   COLONOSCOPY  11/2022   1cm SSP, rpt 3 yrs Martha Coleman)   ESOPHAGOGASTRODUODENOSCOPY  11/2022   small HH, normal esophagus, biopsy with active inflammation and metaplasia but not endoscopically lconsistent with Barrett Martha Coleman)   HYSTEROSCOPY     TONSILLECTOMY AND ADENOIDECTOMY     WISDOM TOOTH EXTRACTION      ALLERGIES: Allergies  Allergen Reactions   Doxycycline Rash    FAMILY HISTORY: Family History  Problem Relation Age of Onset   Hemochromatosis Mother  homozygous for H63D mutation   Diabetes Maternal Grandmother    Hypertension Maternal Grandmother    Migraines Brother     SOCIAL HISTORY: Social History   Tobacco Use   Smoking status: Former    Types: Cigarettes    Quit date: 2014    Years since quitting: 10.4    Smokeless tobacco: Never  Vaping Use   Vaping Use: Never used  Substance Use Topics   Alcohol use: Yes    Alcohol/week: 0.0 standard drinks of alcohol    Comment: social    Drug use: No   Social History   Social History Narrative   Lives at home with family   Right handed   Caffeine: maybe 1 cup/day    Objective:  Vital Signs:  There were no vitals taken for this visit.  ***  Labs and Imaging review: Internal labs: No results found for: "HGBA1C" Lab Results  Component Value Date   VITAMINB12 555 07/06/2020   Lab Results  Component Value Date   TSH 4.29 04/13/2022   Lab Results  Component Value Date   ESRSEDRATE 2 07/06/2020   CMP (04/13/22): unremarkable Ferritin (04/13/22): 22.8  External labs: ***  Imaging: MRI brain w/wo contrast (09/22/19): FINDINGS:  No abnormal lesions are seen on diffusion-weighted views to suggest acute ischemia. The cortical sulci, fissures and cisterns are normal in size and appearance. Lateral, third and fourth ventricle are normal in size and appearance. No extra-axial fluid collections are seen. No evidence of mass effect or midline shift.  No abnormal lesions are seen on post contrast views.     On sagittal views the posterior fossa, pituitary gland and corpus callosum are unremarkable. No evidence of intracranial hemorrhage on gradient-echo views. The orbits and their contents, paranasal sinuses and calvarium are unremarkable.  Intracranial flow voids are present.   IMPRESSION:  Normal MRI brain (with and without).   CTA head and neck (03/23/15): FINDINGS: CT HEAD   Brain: Normal cerebral volume. No midline shift, ventriculomegaly, mass effect, evidence of mass lesion, intracranial hemorrhage or evidence of cortically based acute infarction. Gray-white matter differentiation is within normal limits throughout the brain.   Calvarium and skull base: Normal.   Paranasal sinuses: Visualized paranasal sinuses and mastoids  are clear.   Orbits: Visualized orbits and scalp soft tissues are within normal limits.   CTA NECK   Skeleton: No osseous abnormality identified.   Other neck: No cervical lymphadenopathy. Negative lung apices. No superior mediastinal lymphadenopathy. Thyroid, larynx, pharynx, parapharyngeal spaces, retropharyngeal space, sublingual space, submandibular glands, and parotid glands are within normal limits.   Aortic arch: 4 vessel arch configuration, the left vertebral artery arises directly from the arch. No arch atherosclerosis or great vessel stenosis.   Right carotid system: Streak artifact at the thoracic inlet related to dense left subclavian venous contrast. The right CCA origin appears normal. Negative right CCA. Normal right carotid bifurcation. Normal cervical right ICA.   Left carotid system: Normal left CCA origin. Negative left CCA. Normal left carotid bifurcation. Negative cervical left ICA.   Vertebral arteries:   No proximal right subclavian artery stenosis. Normal right vertebral artery origin. Mildly non dominant right vertebral artery is normal to the skullbase.   The left vertebral artery arises directly from the arch. No origin stenosis. It is mildly dominant, and normal to the skullbase.   CTA HEAD   Posterior circulation: Fairly codominant distal vertebral arteries. Normal PICA origins. Normal vertebrobasilar junction. Normal AICA origins. No basilar artery stenosis.  Normal SCA and left PCA origins. Fetal type right PCA origin. Both posterior communicating arteries are present. Bilateral PCA branches are within normal limits.   Anterior circulation: Normal ICA siphons. Normal ophthalmic and posterior communicating artery origins. Normal carotid termini. Normal MCA and ACA origins. Normal anterior communicating artery. Bilateral ACA branches are within normal limits. Left MCA M1 segment and branches are within normal limits. Right MCA M1 segment  and branches are within normal limits.   Venous sinuses: Normal.   Anatomic variants: Left vertebral artery is mildly dominant and arises directly from the aortic arch.   Delayed phase: No abnormal enhancement identified.   IMPRESSION: 1. Negative CTA head and neck. 2.  Normal CT appearance of the brain. 3. Negative neck soft tissues. ***  Assessment/Plan:  Martha Coleman is a 37 y.o. female who presents for evaluation of ***. *** has a relevant medical history of ***. *** neurological examination is pertinent for ***. Available diagnostic data is significant for ***. This constellation of symptoms and objective data would most likely localize to ***. ***  PLAN: -Blood work: *** ***  -Return to clinic ***  The impression above as well as the plan as outlined below were extensively discussed with the patient (in the company of ***) who voiced understanding. All questions were answered to their satisfaction.  The patient was counseled on pertinent fall precautions per the printed material provided today, and as noted under the "Patient Instructions" section below.***  When available, results of the above investigations and possible further recommendations will be communicated to the patient via telephone/MyChart. Patient to call office if not contacted after expected testing turnaround time.   Total time spent reviewing records, interview, history/exam, documentation, and coordination of care on day of encounter:  *** min   Thank you for allowing me to participate in patient's care.  If I can answer any additional questions, I would be pleased to do so.  Jacquelyne Balint, MD   CC: Martha Boyden, MD [address] [place] [address]  CC: Referring provider: Eustaquio Boyden, MD [address] [place],  [place] [address]

## 2023-04-19 ENCOUNTER — Other Ambulatory Visit (INDEPENDENT_AMBULATORY_CARE_PROVIDER_SITE_OTHER): Payer: BC Managed Care – PPO

## 2023-04-19 DIAGNOSIS — R7989 Other specified abnormal findings of blood chemistry: Secondary | ICD-10-CM | POA: Diagnosis not present

## 2023-04-19 DIAGNOSIS — E785 Hyperlipidemia, unspecified: Secondary | ICD-10-CM | POA: Diagnosis not present

## 2023-04-19 DIAGNOSIS — Z148 Genetic carrier of other disease: Secondary | ICD-10-CM | POA: Diagnosis not present

## 2023-04-19 LAB — CBC WITH DIFFERENTIAL/PLATELET
Basophils Absolute: 0 10*3/uL (ref 0.0–0.1)
Basophils Relative: 0.5 % (ref 0.0–3.0)
Eosinophils Absolute: 0.4 10*3/uL (ref 0.0–0.7)
Eosinophils Relative: 6.6 % — ABNORMAL HIGH (ref 0.0–5.0)
HCT: 43.4 % (ref 36.0–46.0)
Hemoglobin: 14.7 g/dL (ref 12.0–15.0)
Lymphocytes Relative: 20.9 % (ref 12.0–46.0)
Lymphs Abs: 1.4 10*3/uL (ref 0.7–4.0)
MCHC: 33.8 g/dL (ref 30.0–36.0)
MCV: 90.9 fl (ref 78.0–100.0)
Monocytes Absolute: 0.3 10*3/uL (ref 0.1–1.0)
Monocytes Relative: 5 % (ref 3.0–12.0)
Neutro Abs: 4.5 10*3/uL (ref 1.4–7.7)
Neutrophils Relative %: 67 % (ref 43.0–77.0)
Platelets: 282 10*3/uL (ref 150.0–400.0)
RBC: 4.78 Mil/uL (ref 3.87–5.11)
RDW: 12.8 % (ref 11.5–15.5)
WBC: 6.7 10*3/uL (ref 4.0–10.5)

## 2023-04-19 LAB — COMPREHENSIVE METABOLIC PANEL
ALT: 17 U/L (ref 0–35)
AST: 14 U/L (ref 0–37)
Albumin: 4.2 g/dL (ref 3.5–5.2)
Alkaline Phosphatase: 37 U/L — ABNORMAL LOW (ref 39–117)
BUN: 13 mg/dL (ref 6–23)
CO2: 27 mEq/L (ref 19–32)
Calcium: 9.2 mg/dL (ref 8.4–10.5)
Chloride: 105 mEq/L (ref 96–112)
Creatinine, Ser: 1.01 mg/dL (ref 0.40–1.20)
GFR: 71.78 mL/min (ref >60.00)
Glucose, Bld: 86 mg/dL (ref 70–99)
Potassium: 4.5 mEq/L (ref 3.5–5.1)
Sodium: 140 mEq/L (ref 135–145)
Total Bilirubin: 0.8 mg/dL (ref 0.2–1.2)
Total Protein: 6.7 g/dL (ref 6.0–8.3)

## 2023-04-19 LAB — TSH: TSH: 3.95 u[IU]/mL (ref 0.35–5.50)

## 2023-04-19 LAB — LIPID PANEL
Cholesterol: 195 mg/dL (ref 0–200)
HDL: 44.1 mg/dL (ref >39.00)
LDL Cholesterol: 133 mg/dL — ABNORMAL HIGH (ref 0–99)
NonHDL: 150.63
Total CHOL/HDL Ratio: 4
Triglycerides: 89 mg/dL (ref 0.0–149.0)
VLDL: 17.8 mg/dL (ref 0.0–40.0)

## 2023-04-19 LAB — T4, FREE: Free T4: 0.75 ng/dL (ref 0.60–1.60)

## 2023-04-23 NOTE — Progress Notes (Unsigned)
Initial neurology clinic note  JOMARY RUFFING MRN: [id number] DOB: [birth year]  Referring provider: Eustaquio Boyden, MD  Primary care provider: Eustaquio Boyden, MD  Reason for consult:  Headaches  Subjective:  This is [name], a 37 y.o. right-handed female with a medical history of GERD, seronegative RA who presents to neurology clinic with headaches. The patient is alone.  Patient had a headache 3 years ago that required her to go to the ED. She had imaging that did not show anything. 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 She kept doing the botox every 6 months but the headache had resolved. The headache returned in mid-May 2024.  Patient also mentions having bad joint pain (progressive over last few years) all on the right side of her body. Nothing was found in her blood work. She was put on prednisone (2 week taper) in mid April 2024, which resolved her joint pain. Her headache returned in mid-May. Her joint pain also returned. She was then diagnosed with seronegative RA. Rheumatology is attempting to approve Enbrel, but patient is not currently on treatment for this.  The pain is on the right side of her head, particularly behind her head. It is a pressure sensation. She also has throbbing pain on the top of her head or back of head (right side) that can be sharp. The pain can wax and wane, from 4/10 to 9/10. The pain has not completely went away since mid-May. She endorses photophobia, phonophobia, and occasional nausea and vomiting. She may have some tearing from her right eye, but no rhinorrhea or abnormal sweating. She does endorse neck pain.  She endorses vision changes. She feels like she cannot focus. She denies vision loss or vision being less bright. She feels like the right eye lid is weaker (not drooping, but more difficult to close). She is seeing eye doctor in 05/2023.  She denies aura.  She prefers to lay down in a cold, quiet, dark room when  her headaches get bad. She denies changes in quality of the headaches with position changes or valsalva. The headache is usually worse mid-day. The headache does not usually wake her up at night. She will take ibuprofen or Goody's powder, but will not help much with her headache. Sudafed sinus will help some, but not eliminate her headache.  The only trigger she mentions is when moving her right eye. She has some dizziness when turning her head quickly as well or when moving the right eye.  Family with headaches? If so, what type? Brother with migraines  Social history: Caffiene use: 1 cup of coffee per day Tobacco use: None EtOH use: Once a week or every two weeks (1 glass of wine) Sleep: No problems Mood: More frustrated and anxiety lately after headache onset  Patient was previously on amitriptyline 10 mg as needed, for urinary issues after pregnancy a couple of years ago. She denies Martha side effects with that medication.   Of note, patient takes aldactone for acne.  MEDICATIONS:  Outpatient Encounter Medications as of 04/25/2023  Medication Sig   Cetirizine HCl (ZYRTEC ALLERGY PO) Take by mouth.   fluticasone (FLONASE) 50 MCG/ACT nasal spray USE TWO SPRAYS IN EACH NOSTRIL ONE TIME DAILY   spironolactone (ALDACTONE) 100 MG tablet Take 100 mg by mouth daily. With food   [DISCONTINUED] amitriptyline (ELAVIL) 10 MG tablet Take 10 mg by mouth daily. As needed   [DISCONTINUED] pantoprazole (PROTONIX) 40 MG tablet Take 40 mg by  mouth every morning.   No facility-administered encounter medications on file as of 04/25/2023.    PAST MEDICAL HISTORY: Past Medical History:  Diagnosis Date   Chest pain 07/09/2020   COVID-19 virus infection 11/2019   Genital warts    Health maintenance examination 04/15/2021   Normal pregnancy 06/12/2011   SVD (spontaneous vaginal delivery) 10/12/2016   Upper respiratory tract infection 04/23/2022    PAST SURGICAL HISTORY: Past Surgical History:   Procedure Laterality Date   COLONOSCOPY  11/2022   1cm SSP, rpt 3 yrs [address])   ESOPHAGOGASTRODUODENOSCOPY  11/2022   small HH, normal esophagus, biopsy with active inflammation and metaplasia but not endoscopically lconsistent with Barrett [address])   HYSTEROSCOPY     TONSILLECTOMY AND ADENOIDECTOMY     WISDOM TOOTH EXTRACTION      ALLERGIES: Allergies  Allergen Reactions   Doxycycline Rash    FAMILY HISTORY: Family History  Problem Relation Age of Onset   Hemochromatosis Mother        homozygous for H63D mutation   Diabetes Maternal Grandmother    Hypertension Maternal Grandmother    Migraines Brother     SOCIAL HISTORY: Social History   Tobacco Use   Smoking status: Former    Types: Cigarettes    Quit date: 2014    Years since quitting: 10.4   Smokeless tobacco: Never  Vaping Use   Vaping Use: Never used  Substance Use Topics   Alcohol use: Yes    Alcohol/week: 0.0 standard drinks of alcohol    Comment: social    Drug use: No   Social History   Social History Narrative   Lives at home with family--1 level home   Right handed   Caffeine: maybe 1 cup/day    Objective:  Vital Signs:  BP 127/84   Pulse 86   Ht 5\' 4"  (1.626 m)   Wt 171 lb (77.6 kg)   LMP 03/29/2023   SpO2 98%   BMI 29.35 kg/m   General: No acute distress.  Patient appears well-groomed.   Head:  Normocephalic/atraumatic Eyes:  fundi examined, disc margins appear crisp, no obvious papilledema on non-dilated fundoscopy. She did mention that light seemed brighter in right eye than left eye. Neck: supple, no paraspinal tenderness, full range of motion  Neurological Exam: Mental status: alert and oriented, speech fluent and not dysarthric, language intact.  Cranial nerves: CN I: not tested CN II: pupils equal, round and reactive to light, visual fields intact CN III, IV, VI:  full range of motion, no nystagmus, no ptosis. Endorses dizziness when moving eyes. CN V: facial sensation  intact. CN VII: upper and lower face symmetric CN VIII: hearing intact CN IX, X: uvula midline CN XI: sternocleidomastoid and trapezius muscles intact CN XII: tongue midline  Bulk & Tone: normal, no fasciculations. Motor:  muscle strength 5/5 throughout Deep Tendon Reflexes:  2+ throughout  Sensation:  Light touch sensation intact. Finger to nose testing:  Without dysmetria.   Gait:  Normal station and stride.   Labs and Imaging review: Internal labs: No results found for: "HGBA1C" Lab Results  Component Value Date   VITAMINB12 555 07/06/2020   Lab Results  Component Value Date   TSH 3.95 04/19/2023   Lab Results  Component Value Date   ESRSEDRATE 2 07/06/2020   CMP (04/13/22): unremarkable Ferritin (04/13/22): 22.8  Imaging: MRI brain w/wo contrast (09/22/19): FINDINGS:  No abnormal lesions are seen on diffusion-weighted views to suggest acute ischemia. The cortical sulci, fissures  and cisterns are normal in size and appearance. Lateral, third and fourth ventricle are normal in size and appearance. No extra-axial fluid collections are seen. No evidence of mass effect or midline shift.  No abnormal lesions are seen on post contrast views.     On sagittal views the posterior fossa, pituitary gland and corpus callosum are unremarkable. No evidence of intracranial hemorrhage on gradient-echo views. The orbits and their contents, paranasal sinuses and calvarium are unremarkable.  Intracranial flow voids are present.   IMPRESSION:  Normal MRI brain (with and without).    CTA head and neck (03/23/15): FINDINGS: CT HEAD   Brain: Normal cerebral volume. No midline shift, ventriculomegaly, mass effect, evidence of mass lesion, intracranial hemorrhage or evidence of cortically based acute infarction. Gray-white matter differentiation is within normal limits throughout the brain.   Calvarium and skull base: Normal.   Paranasal sinuses: Visualized paranasal sinuses and mastoids  are clear.   Orbits: Visualized orbits and scalp soft tissues are within normal limits.   CTA NECK   Skeleton: No osseous abnormality identified.   Other neck: No cervical lymphadenopathy. Negative lung apices. No superior mediastinal lymphadenopathy. Thyroid, larynx, pharynx, parapharyngeal spaces, retropharyngeal space, sublingual space, submandibular glands, and parotid glands are within normal limits.   Aortic arch: 4 vessel arch configuration, the left vertebral artery arises directly from the arch. No arch atherosclerosis or great vessel stenosis.   Right carotid system: Streak artifact at the thoracic inlet related to dense left subclavian venous contrast. The right CCA origin appears normal. Negative right CCA. Normal right carotid bifurcation. Normal cervical right ICA.   Left carotid system: Normal left CCA origin. Negative left CCA. Normal left carotid bifurcation. Negative cervical left ICA.   Vertebral arteries:   No proximal right subclavian artery stenosis. Normal right vertebral artery origin. Mildly non dominant right vertebral artery is normal to the skullbase.   The left vertebral artery arises directly from the arch. No origin stenosis. It is mildly dominant, and normal to the skullbase.   CTA HEAD   Posterior circulation: Fairly codominant distal vertebral arteries. Normal PICA origins. Normal vertebrobasilar junction. Normal AICA origins. No basilar artery stenosis. Normal SCA and left PCA origins. Fetal type right PCA origin. Both posterior communicating arteries are present. Bilateral PCA branches are within normal limits.   Anterior circulation: Normal ICA siphons. Normal ophthalmic and posterior communicating artery origins. Normal carotid termini. Normal MCA and ACA origins. Normal anterior communicating artery. Bilateral ACA branches are within normal limits. Left MCA M1 segment and branches are within normal limits. Right MCA M1 segment  and branches are within normal limits.   Venous sinuses: Normal.   Anatomic variants: Left vertebral artery is mildly dominant and arises directly from the aortic arch.   Delayed phase: No abnormal enhancement identified.   IMPRESSION: 1. Negative CTA head and neck. 2.  Normal CT appearance of the brain. 3. Negative neck soft tissues.   Assessment/Plan:  Martha Coleman is a 37 y.o. female who presents for evaluation of headache. She has a relevant medical history of GERD and seronegative RA. Her neurological examination is essentially normal today. The etiology of patient's headaches is currently unclear. The differential includes hemicrania continua, migraine without aura (status migrainosus), or IIH. There are no clear changes in headache with position, so IIH may be less likely, but is still considered with the non-specific vision changes. Migraine is possible, but the infrequency of headaches and length of current headache is unusual. I would like  to start with an indomethacin trial for hemicrania continua then expand work up or consider other treatments if she does not quickly respond as would be expected with hemicrania continua.  PLAN: -Indomethacin trial - 25 mg for 3 days, then 50 mg for 3 days, then 75 mg for 3 days: Take 1 capsule (25 mg) 3 times daily for 3 days, if no resolution to headache, then increase to 2 capsules (50 mg) 3 times daily for 3 days, if no resolution to headache, then increase 3 capsules 3 times daily for 3 days -MRI brain w/wo contrast  -Return to clinic in 3 months  The impression above as well as the plan as outlined below were extensively discussed with the patient who voiced understanding. All questions were answered to their satisfaction.  When available, results of the above investigations and possible further recommendations will be communicated to the patient via telephone/MyChart. Patient to call office if not contacted after expected testing  turnaround time.   Total time spent reviewing records, interview, history/exam, documentation, and coordination of care on day of encounter:  60 min   Thank you for allowing me to participate in patient's care.  If I can answer Martha additional questions, I would be pleased to do so.  Jacquelyne Balint, MD   CC: Eustaquio Boyden, MD [address] [place] [address]  CC: Referring provider: Eustaquio Boyden, MD [address] [place],  [place] [address]

## 2023-04-25 ENCOUNTER — Ambulatory Visit: Payer: BC Managed Care – PPO | Admitting: Neurology

## 2023-04-25 ENCOUNTER — Encounter: Payer: Self-pay | Admitting: Neurology

## 2023-04-25 VITALS — BP 127/84 | HR 86 | Ht 64.0 in | Wt 171.0 lb

## 2023-04-25 DIAGNOSIS — G4451 Hemicrania continua: Secondary | ICD-10-CM

## 2023-04-25 DIAGNOSIS — R519 Headache, unspecified: Secondary | ICD-10-CM

## 2023-04-25 DIAGNOSIS — H538 Other visual disturbances: Secondary | ICD-10-CM | POA: Diagnosis not present

## 2023-04-25 DIAGNOSIS — Q159 Congenital malformation of eye, unspecified: Secondary | ICD-10-CM

## 2023-04-25 MED ORDER — INDOMETHACIN 25 MG PO CAPS: ORAL_CAPSULE | ORAL | 0 refills | Status: DC

## 2023-04-25 NOTE — Patient Instructions (Addendum)
I saw you today for headache. As we discussed, there are different possibilities as to what is causing your headaches. This could be migraines, increased intracranial pressure, or a headache called hemicrania continua. 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  You will take 1 capsule (25 mg) of Indomethacin 3 times daily for 3 days, if no resolution to headache, then increase to 2 capsules (50 mg) 3 times daily for 3 days, if no resolution to headache, then increase 3 capsules 3 times daily for 3 days.  If your headache resolves, stay at the current dose you are on and let me know. If you get to the end of the trial without headache resolution, this is considered failure and would not be hemicrania continua. We will pivot at that point to the next treatment option.  Call or message anytime with questions or concerns.  The physicians and staff at [facility] are committed to providing excellent care. You may receive a survey requesting feedback about your experience at our office. We strive to receive "very good" responses to the survey questions. If you feel that your experience would prevent you from giving the office a "very good " response, please contact our office to try to remedy the situation. We may be reached at [phone]. Thank you for taking the time out of your busy day to complete the survey.  Jacquelyne Balint, MD [facility]

## 2023-04-26 ENCOUNTER — Ambulatory Visit (INDEPENDENT_AMBULATORY_CARE_PROVIDER_SITE_OTHER): Payer: BC Managed Care – PPO | Admitting: Family Medicine

## 2023-04-26 ENCOUNTER — Encounter: Payer: Self-pay | Admitting: Family Medicine

## 2023-04-26 VITALS — BP 124/86 | HR 85 | Temp 97.4°F | Ht 63.5 in | Wt 169.2 lb

## 2023-04-26 DIAGNOSIS — Z148 Genetic carrier of other disease: Secondary | ICD-10-CM | POA: Diagnosis not present

## 2023-04-26 DIAGNOSIS — G44039 Episodic paroxysmal hemicrania, not intractable: Secondary | ICD-10-CM

## 2023-04-26 DIAGNOSIS — Z0000 Encounter for general adult medical examination without abnormal findings: Secondary | ICD-10-CM | POA: Diagnosis not present

## 2023-04-26 DIAGNOSIS — M1990 Unspecified osteoarthritis, unspecified site: Secondary | ICD-10-CM

## 2023-04-26 DIAGNOSIS — E663 Overweight: Secondary | ICD-10-CM | POA: Diagnosis not present

## 2023-04-26 DIAGNOSIS — H538 Other visual disturbances: Secondary | ICD-10-CM

## 2023-04-26 MED ORDER — SCOPOLAMINE 1 MG/3DAYS TD PT72: 1.0000 | MEDICATED_PATCH | TRANSDERMAL | 0 refills | Status: DC

## 2023-04-26 NOTE — Progress Notes (Unsigned)
Ph: [phone] Fax: [phone]   Patient ID: Martha Coleman, female    DOB: December 04, 1985, 37 y.o.   MRN: [id number]  This visit was conducted in person.  BP 124/86   Pulse 85   Temp (!) 97.4 F (36.3 C) (Temporal)   Ht 5' 3.5" (1.613 m)   Wt 169 lb 4 oz (76.8 kg)   LMP 04/09/2023   SpO2 96%   BMI 29.51 kg/m   Vision Screening   Right eye Left eye Both eyes  Without correction 20/20 20/20 20/25   With correction       CC: CPE Subjective:   HPI: Martha Coleman is a 37 y.o. female presenting on 04/26/2023 for Annual Exam   Saw neurology Dr Loleta Chance yesterday for chronic headache ?hemicrania continua vs status migrainosus vs IIH - planned indocin trial and MRI brain. Previously received botox injections for headaches through dermatology office. Has previously seen Dr Lucia Gaskins. Was recommended ophthalmology evaluation - requests referral in [place].   Work changed chemical solution for pathology staining - eye irritation/swelling has improved.   Saw rheumatologist Deanna Lalama Dierdre Forth) [facility] [facility] for joint pain - dx seronegative RA vs psoriatic arthritis, however insurance wouldn't cover Enbrel. Failed NSAIDs.   EGD 11/2022 - WNL, small HH, nodules to GEJ biopsied - biopsy with active inflammation and metaplasia but not endoscopically consistent with Barrett's [address])   Preventative: Colonoscopy 11/2022 - SSP rpt 3 yrs, done for episode of rectal bleeding [address])  Well woman - through [facility] Dr Ellyn Hack. Normal paps in the past. Taking amitriptyline for IC through GYN. Last seen 10/2022  Reassuring mammo/US with biopsy 07/2022 - fibrocystic changes with PASH.  DEXA scan - not due Flu shot yearly COVID vaccine - declined Completed Hep B series  Tdap 2012, 2017 Seat belt use discussed  Sunscreen use discussed. No changing moles on skin.  Sleep - averaging 5-6 hours/night Non smoker  Alcohol - seldom  Dentist - yearly  Eye exam - due  Lives at home with family Right  handed Caffeine: 1 cup/day Occ: works at dermatology office Activity: limited due to joint pain, trying to bike more regularly  Diet: water 36 oz/day, fruits/vegetables daily      Relevant past medical, surgical, family and social history reviewed and updated as indicated. Interim medical history since our last visit reviewed. Allergies and medications reviewed and updated. Outpatient Medications Prior to Visit  Medication Sig Dispense Refill   Cetirizine HCl (ZYRTEC ALLERGY PO) Take by mouth.     fluticasone (FLONASE) 50 MCG/ACT nasal spray USE TWO SPRAYS IN EACH NOSTRIL ONE TIME DAILY 16 mL 11   indomethacin (INDOCIN) 25 MG capsule Take 1 capsule (25 mg) 3 times daily for 3 days, if no resolution to headache, then increase to 2 capsules (50 mg) 3 times daily for 3 days, if no resolution to headache, then increase 3 capsules 3 times daily for 3 days 54 capsule 0   spironolactone (ALDACTONE) 100 MG tablet Take 100 mg by mouth daily. With food     No facility-administered medications prior to visit.     Per HPI unless specifically indicated in ROS section below Review of Systems  Constitutional:  Negative for activity change, appetite change, chills, fatigue, fever and unexpected weight change.  HENT:  Negative for hearing loss.   Eyes:  Negative for visual disturbance.  Respiratory:  Negative for cough, chest tightness, shortness of breath and wheezing.   Cardiovascular:  Negative for chest pain, palpitations  and leg swelling.  Gastrointestinal:  Negative for abdominal distention, abdominal pain, blood in stool, constipation, diarrhea, nausea and vomiting.  Genitourinary:  Negative for difficulty urinating and hematuria.  Musculoskeletal:  Negative for arthralgias, myalgias and neck pain.  Skin:  Negative for rash.  Neurological:  Positive for dizziness and headaches (see HPI). Negative for seizures and syncope.  Hematological:  Negative for adenopathy. Does not bruise/bleed easily.   Psychiatric/Behavioral:  Negative for dysphoric mood. The patient is not nervous/anxious.     Objective:  BP 124/86   Pulse 85   Temp (!) 97.4 F (36.3 C) (Temporal)   Ht 5' 3.5" (1.613 m)   Wt 169 lb 4 oz (76.8 kg)   LMP 04/09/2023   SpO2 96%   BMI 29.51 kg/m   Wt Readings from Last 3 Encounters:  04/26/23 169 lb 4 oz (76.8 kg)  04/25/23 171 lb (77.6 kg)  12/14/22 167 lb 2 oz (75.8 kg)      Physical Exam Vitals and nursing note reviewed.  Constitutional:      Appearance: Normal appearance. She is not ill-appearing.  HENT:     Head: Normocephalic and atraumatic.     Right Ear: Tympanic membrane, ear canal and external ear normal. There is no impacted cerumen.     Left Ear: Tympanic membrane, ear canal and external ear normal. There is no impacted cerumen.     Mouth/Throat:     Mouth: Mucous membranes are moist.     Pharynx: Oropharynx is clear. No oropharyngeal exudate or posterior oropharyngeal erythema.  Eyes:     General:        Right eye: No discharge.        Left eye: No discharge.     Extraocular Movements: Extraocular movements intact.     Conjunctiva/sclera: Conjunctivae normal.     Pupils: Pupils are equal, round, and reactive to light.  Neck:     Thyroid: No thyroid mass or thyromegaly.  Cardiovascular:     Rate and Rhythm: Normal rate and regular rhythm.     Pulses: Normal pulses.     Heart sounds: Normal heart sounds. No murmur heard. Pulmonary:     Effort: Pulmonary effort is normal. No respiratory distress.     Breath sounds: Normal breath sounds. No wheezing, rhonchi or rales.  Abdominal:     General: Bowel sounds are normal. There is no distension.     Palpations: Abdomen is soft. There is no mass.     Tenderness: There is no abdominal tenderness. There is no guarding or rebound.     Hernia: No hernia is present.  Musculoskeletal:     Cervical back: Normal range of motion and neck supple. No rigidity.     Right lower leg: No edema.     Left  lower leg: No edema.  Lymphadenopathy:     Cervical: No cervical adenopathy.  Skin:    General: Skin is warm and dry.     Findings: No rash.  Neurological:     General: No focal deficit present.     Mental Status: She is alert. Mental status is at baseline.  Psychiatric:        Mood and Affect: Mood normal.        Behavior: Behavior normal.       Results for orders placed or performed in visit on 04/19/23  Lipid panel  Result Value Ref Range   Cholesterol 195 0 - 200 mg/dL   Triglycerides 08.6 0.0 - 149.0  mg/dL   HDL 16.10 >96.04 mg/dL   VLDL 54.0 0.0 - 98.1 mg/dL   LDL Cholesterol 191 (H) 0 - 99 mg/dL   Total CHOL/HDL Ratio 4    NonHDL 150.63   Comprehensive metabolic panel  Result Value Ref Range   Sodium 140 135 - 145 mEq/L   Potassium 4.5 3.5 - 5.1 mEq/L   Chloride 105 96 - 112 mEq/L   CO2 27 19 - 32 mEq/L   Glucose, Bld 86 70 - 99 mg/dL   BUN 13 6 - 23 mg/dL   Creatinine, Ser 4.78 0.40 - 1.20 mg/dL   Total Bilirubin 0.8 0.2 - 1.2 mg/dL   Alkaline Phosphatase 37 (L) 39 - 117 U/L   AST 14 0 - 37 U/L   ALT 17 0 - 35 U/L   Total Protein 6.7 6.0 - 8.3 g/dL   Albumin 4.2 3.5 - 5.2 g/dL   GFR 29.56 >21.30 mL/min   Calcium 9.2 8.4 - 10.5 mg/dL  CBC with Differential/Platelet  Result Value Ref Range   WBC 6.7 4.0 - 10.5 K/uL   RBC 4.78 3.87 - 5.11 Mil/uL   Hemoglobin 14.7 12.0 - 15.0 g/dL   HCT 86.5 78.4 - 69.6 %   MCV 90.9 78.0 - 100.0 fl   MCHC 33.8 30.0 - 36.0 g/dL   RDW 29.5 28.4 - 13.2 %   Platelets 282.0 150.0 - 400.0 K/uL   Neutrophils Relative % 67.0 43.0 - 77.0 %   Lymphocytes Relative 20.9 12.0 - 46.0 %   Monocytes Relative 5.0 3.0 - 12.0 %   Eosinophils Relative 6.6 (H) 0.0 - 5.0 %   Basophils Relative 0.5 0.0 - 3.0 %   Neutro Abs 4.5 1.4 - 7.7 K/uL   Lymphs Abs 1.4 0.7 - 4.0 K/uL   Monocytes Absolute 0.3 0.1 - 1.0 K/uL   Eosinophils Absolute 0.4 0.0 - 0.7 K/uL   Basophils Absolute 0.0 0.0 - 0.1 K/uL  T4, free  Result Value Ref Range   Free T4  0.75 0.60 - 1.60 ng/dL  TSH  Result Value Ref Range   TSH 3.95 0.35 - 5.50 uIU/mL   Lab Results  Component Value Date   IRON 82 04/13/2022   TIBC 359.8 04/13/2022   FERRITIN 22.8 04/13/2022   Assessment & Plan:   Problem List Items Addressed This Visit     Health maintenance examination - Primary (Chronic)    Preventative protocols reviewed and updated unless pt declined. Discussed healthy diet and lifestyle.       Overweight (BMI 25.0-29.9)    Reviewed healthy diet and lifestyle choices. Activity limited by joint pains - possible seronegative arthritis vs psoriatic arthritis.  Will refer to nutritionist per pt request.       Relevant Orders   Amb ref to Medical Nutrition Therapy-MNT   Headache    Chronic headaches, previously treating with botox through dermatology office.  Has previously seen neuro Dr Lucia Gaskins, most recently established with neurology Dr Loleta Chance - ?IIH vs hemicrania continua vs migraine. Was recommended eye exam given associated R vision changes - referral placed. Pending trial NSAID indocin and MRI - caution in h/o GEJ inflammation/metaplasia by biopsy. Has associated dizziness and R blurry vision with these headaches.       Relevant Orders   Ambulatory referral to Ophthalmology   Carrier of hemochromatosis HFE gene mutation    Know carrier. No evidence of iron overload      Blurry vision, right eye  With headache, seeing neurology. Refer to ophthalmology for evaluation       Relevant Orders   Ambulatory referral to Ophthalmology   Inflammatory arthritis    Now seeing [place] [facility] (Dr Dierdre Forth, Megan Salon).  Psoriatic arthritis vs seronegative RA. NSAIDs ineffective. Insurance did not cover Enbrel.         Meds ordered this encounter  Medications   scopolamine (TRANSDERM-SCOP) 1 MG/3DAYS    Sig: Place 1 patch (1.5 mg total) onto the skin every 3 (three) days.    Dispense:  4 patch    Refill:  0    Orders Placed This Encounter   Procedures   Ambulatory referral to Ophthalmology    Referral Priority:   Routine    Referral Type:   Consultation    Referral Reason:   Specialty Services Required    Requested Specialty:   Ophthalmology    Number of Visits Requested:   1   Amb ref to Medical Nutrition Therapy-MNT    Referral Priority:   Routine    Referral Type:   Consultation    Referral Reason:   Specialty Services Required    Requested Specialty:   Nutrition    Number of Visits Requested:   1    Patient Instructions  Vision screen today  We will refer you to ophthalmologist in [place].  May use scopolamine patch for motion sickness.  We will refer you to nutritionist.  Good to see you today  Return in 1 year for next physical.   Follow up plan: Return in about 1 year (around 04/25/2024) for annual exam, prior fasting for blood work.  Eustaquio Boyden, MD

## 2023-04-26 NOTE — Patient Instructions (Addendum)
Vision screen today  We will refer you to ophthalmologist in [place].  May use scopolamine patch for motion sickness.  We will refer you to nutritionist.  Good to see you today  Return in 1 year for next physical.

## 2023-04-26 NOTE — Assessment & Plan Note (Signed)
Preventative protocols reviewed and updated unless pt declined. Discussed healthy diet and lifestyle.  

## 2023-04-27 ENCOUNTER — Encounter: Payer: Self-pay | Admitting: Family Medicine

## 2023-04-27 DIAGNOSIS — H538 Other visual disturbances: Secondary | ICD-10-CM | POA: Insufficient documentation

## 2023-04-27 DIAGNOSIS — M1990 Unspecified osteoarthritis, unspecified site: Secondary | ICD-10-CM

## 2023-04-27 HISTORY — DX: Unspecified osteoarthritis, unspecified site: M19.90

## 2023-04-27 NOTE — Assessment & Plan Note (Signed)
Now seeing [facility] (Dr Dierdre Forth, Megan Salon).  Psoriatic arthritis vs seronegative RA. NSAIDs ineffective. Insurance did not cover Enbrel.

## 2023-04-27 NOTE — Assessment & Plan Note (Signed)
Chronic headaches, previously treating with botox through dermatology office.  Has previously seen neuro Dr Lucia Gaskins, most recently established with neurology Dr Loleta Chance - ?IIH vs hemicrania continua vs migraine. Was recommended eye exam given associated R vision changes - referral placed. Pending trial NSAID indocin and MRI - caution in h/o GEJ inflammation/metaplasia by biopsy. Has associated dizziness and R blurry vision with these headaches.

## 2023-04-27 NOTE — Assessment & Plan Note (Signed)
Know carrier. No evidence of iron overload

## 2023-04-27 NOTE — Assessment & Plan Note (Signed)
Reviewed healthy diet and lifestyle choices. Activity limited by joint pains - possible seronegative arthritis vs psoriatic arthritis.  Will refer to nutritionist per pt request.

## 2023-04-27 NOTE — Assessment & Plan Note (Signed)
With headache, seeing neurology. Refer to ophthalmology for evaluation

## 2023-04-27 NOTE — Assessment & Plan Note (Deleted)
Regularly sees GYN, recent paps normal.

## 2023-05-01 ENCOUNTER — Encounter: Payer: Self-pay | Admitting: *Deleted

## 2023-05-07 ENCOUNTER — Encounter: Payer: Self-pay | Admitting: Neurology

## 2023-05-08 ENCOUNTER — Other Ambulatory Visit: Payer: Self-pay | Admitting: Neurology

## 2023-05-08 DIAGNOSIS — R519 Headache, unspecified: Secondary | ICD-10-CM

## 2023-05-08 MED ORDER — NORTRIPTYLINE HCL 10 MG PO CAPS: 20.0000 mg | ORAL_CAPSULE | Freq: Every day | ORAL | 3 refills | Status: DC

## 2023-05-22 ENCOUNTER — Other Ambulatory Visit: Payer: Self-pay | Admitting: Neurology

## 2023-05-22 DIAGNOSIS — G4451 Hemicrania continua: Secondary | ICD-10-CM

## 2023-05-22 MED ORDER — INDOMETHACIN 25 MG PO CAPS: ORAL_CAPSULE | ORAL | 0 refills | Status: DC

## 2023-05-24 ENCOUNTER — Ambulatory Visit: Payer: BC Managed Care – PPO | Admitting: Neurology

## 2023-05-29 ENCOUNTER — Encounter: Payer: Self-pay | Admitting: Neurology

## 2023-05-30 ENCOUNTER — Telehealth: Payer: Self-pay | Admitting: Neurology

## 2023-05-30 ENCOUNTER — Other Ambulatory Visit: Payer: Self-pay

## 2023-05-30 DIAGNOSIS — M542 Cervicalgia: Secondary | ICD-10-CM

## 2023-05-30 DIAGNOSIS — G4451 Hemicrania continua: Secondary | ICD-10-CM

## 2023-05-30 MED ORDER — GABAPENTIN 300 MG PO CAPS: 300.0000 mg | ORAL_CAPSULE | Freq: Three times a day (TID) | ORAL | 11 refills | Status: DC

## 2023-05-30 MED ORDER — INDOMETHACIN ER 75 MG PO CPCR: 75.0000 mg | ORAL_CAPSULE | Freq: Two times a day (BID) | ORAL | 2 refills | Status: DC

## 2023-05-30 NOTE — Telephone Encounter (Signed)
I called patient to discuss symptoms and response to indomethacin trial. Patient is finishing up her third day of 75 mg TID of indomethacin. She has about 75% reduction in headache with indomethacin. This is much better than when on Nortriptyline. She mentions that her headache got much worse when she stopped indomethacin and began Nortriptyline. Indomethacin is not working for more than about 4 hours though. She is on a PPI, but still has some issues with tolerating the indomethacin due to GI upset.  She again confirms that her headache is always present, on the right side of head, is worse when up (not laying down), worse mid-day, and associated with neck pain. She gets only occasional photophobia, phonophobia, nausea, and vomiting.  After discussion, patient's headaches do appear to be responding to indomethacin, as would be the case with hemicrania continua. Her headaches do no appear consistent with IIH. Migraines or new daily persistent headache is also possible. She has significant neck pain, so cervicogenic headache could at least be contributing as well. Patient and I agreed on the following plan:  PT for neck pain Change indomethacin to long acting, indomethacin SR 75 mg BID Add gabapentin 300 mg TID  Patient to keep me up to date on response or problems with medications. All questions were answered.  Martha Balint, MD [facility]

## 2023-05-31 ENCOUNTER — Other Ambulatory Visit: Payer: Self-pay

## 2023-05-31 ENCOUNTER — Ambulatory Visit: Payer: BC Managed Care – PPO

## 2023-05-31 ENCOUNTER — Telehealth: Payer: Self-pay | Admitting: Neurology

## 2023-05-31 DIAGNOSIS — R519 Headache, unspecified: Secondary | ICD-10-CM | POA: Diagnosis not present

## 2023-05-31 MED ORDER — DIPHENHYDRAMINE HCL 50 MG/ML IJ SOLN
25.0000 mg | Freq: Once | INTRAMUSCULAR | Status: AC
  Administered 2023-05-31: 25 mg via INTRAMUSCULAR

## 2023-05-31 MED ORDER — METOCLOPRAMIDE HCL 5 MG/ML IJ SOLN
10.0000 mg | Freq: Once | INTRAMUSCULAR | Status: AC
  Administered 2023-05-31: 10 mg via INTRAMUSCULAR

## 2023-05-31 MED ORDER — KETOROLAC TROMETHAMINE 60 MG/2ML IM SOLN
60.0000 mg | Freq: Once | INTRAMUSCULAR | Status: AC
  Administered 2023-05-31: 60 mg via INTRAMUSCULAR

## 2023-05-31 NOTE — Telephone Encounter (Signed)
Patient reached out to me about a very severe headache that she woke up with this morning. It is worse than her normal headache, but still same quality, right sided. She started gabapentin last night and Indomethacin SR today. She was wondering if this was a side effect. I told her that is not usual side effect, but she could hold gabapentin if she would like. Given the severe nature, we discussed a steroid dose pack or in office headache cocktail. Patient would like to come in for a cocktail.  She will come in today and get toradol, reglan, and benadryl as per protocols.  All questions were answered.  Jacquelyne Balint, MD [facility]

## 2023-06-04 ENCOUNTER — Encounter: Payer: Self-pay | Admitting: Neurology

## 2023-06-07 ENCOUNTER — Other Ambulatory Visit: Payer: BC Managed Care – PPO

## 2023-06-10 ENCOUNTER — Telehealth: Payer: Self-pay

## 2023-06-10 NOTE — Telephone Encounter (Signed)
-----   Message from Antony Madura sent at 06/10/2023 11:45 AM EDT ----- Regarding: MRI brain PA Martha Coleman,  I submitted a letter of medical necessity last week for this patient and her MRI that insurance denied. Her MRI is scheduled for Friday. Can we see the status of whether this has been approved? Maybe called [facility] (?).  Thank you,  Jacquelyne Balint, MD

## 2023-06-10 NOTE — Telephone Encounter (Signed)
Mri was Approved

## 2023-06-14 ENCOUNTER — Ambulatory Visit: Admission: RE | Admit: 2023-06-14 | Payer: BC Managed Care – PPO | Source: Ambulatory Visit

## 2023-06-14 DIAGNOSIS — H538 Other visual disturbances: Secondary | ICD-10-CM | POA: Diagnosis not present

## 2023-06-14 DIAGNOSIS — R519 Headache, unspecified: Secondary | ICD-10-CM

## 2023-06-14 DIAGNOSIS — G4451 Hemicrania continua: Secondary | ICD-10-CM

## 2023-06-14 DIAGNOSIS — Q159 Congenital malformation of eye, unspecified: Secondary | ICD-10-CM

## 2023-06-14 MED ORDER — GADOPICLENOL 0.5 MMOL/ML IV SOLN
7.5000 mL | Freq: Once | INTRAVENOUS | Status: AC | PRN
  Administered 2023-06-14: 7.5 mL via INTRAVENOUS

## 2023-06-28 ENCOUNTER — Ambulatory Visit: Payer: BC Managed Care – PPO | Admitting: Dietician

## 2023-07-02 ENCOUNTER — Ambulatory Visit: Payer: BC Managed Care – PPO | Admitting: Physical Therapy

## 2023-07-02 ENCOUNTER — Ambulatory Visit: Payer: BC Managed Care – PPO

## 2023-07-02 NOTE — Therapy (Unsigned)
OUTPATIENT PHYSICAL THERAPY EVALUATION   Patient Name: Martha Coleman MRN: [id number] DOB:1986-02-15, 37 y.o., female Today's Date: 07/03/2023  END OF SESSION:  PT End of Session - 07/03/23 2037     Visit Number 1    Number of Visits 13    Date for PT Re-Evaluation 09/25/23    Authorization Type [employer] COMM PPO reporting period from 07/03/2023    Authorization Time Period VL: 30 PT/OT/Chiro    Authorization - Visit Number 1    Authorization - Number of Visits 30    Progress Note Due on Visit 10    PT Start Time 1809    PT Stop Time 1905    PT Time Calculation (min) 56 min    Activity Tolerance Patient tolerated treatment well    Behavior During Therapy [facility] for tasks assessed/performed             Past Medical History:  Diagnosis Date   Chest pain 07/09/2020   COVID-19 virus infection 11/2019   Genital warts    Health maintenance examination 04/15/2021   History of COVID-19 11/25/2019   Inflammatory arthritis 04/27/2023   Normal pregnancy 06/12/2011   SVD (spontaneous vaginal delivery) 10/12/2016   Upper respiratory tract infection 04/23/2022   Past Surgical History:  Procedure Laterality Date   COLONOSCOPY  11/2022   1cm SSP, rpt 3 yrs [address])   ESOPHAGOGASTRODUODENOSCOPY  11/2022   small HH, normal esophagus, biopsy with active inflammation and metaplasia but not endoscopically consistent with Barrett [address])   HYSTEROSCOPY     TONSILLECTOMY AND ADENOIDECTOMY     WISDOM TOOTH EXTRACTION     Patient Active Problem List   Diagnosis Date Noted   Blurry vision, right eye 04/27/2023   Inflammatory arthritis 04/27/2023   Vertigo 12/14/2022   Allergic reaction to chemical substance 12/14/2022   Health maintenance examination 04/15/2021   Abnormal TSH 04/14/2021   Chest pain 07/09/2020   Epigastric pain 07/09/2020   Carrier of hemochromatosis HFE gene mutation 09/19/2019   Family history of hemochromatosis 09/10/2019   Headache 08/13/2019   Abnormal cervical  Papanicolaou smear 12/06/2017   Irregular periods 12/06/2017   Oligomenorrhea 12/06/2017   Overweight (BMI 25.0-29.9) 12/06/2017   Carrier of group B Streptococcus 09/07/2016    PCP: Eustaquio Boyden, MD  REFERRING PROVIDER: Antony Madura, MD (neurology)  REFERRING DIAG: neck pain  THERAPY DIAG:  Cervicalgia  Intractable headache, unspecified chronicity pattern, unspecified headache type  Rationale for Evaluation and Treatment: Rehabilitation  ONSET DATE: 2017  SUBJECTIVE:  SUBJECTIVE STATEMENT: Patient states she is coming to PT because she is having constant headaches on the right side of her head and behind her eye. She states it is "the whole thing" the right sie of the head when it is really bad. She states her doctor thought maybe the   Her neck pain started when she gave birth to her last child in 2017. She had an epidural which did not work properly, and every time she would push the button for the epidural the back of her neck would cramp up. Since then she had neck problems and it got even worse when she started working out in 2021 (weight training, heavy lifting). Since then she still works out intermittently. Her pain gradually got worse when she started strength training and there was no certain event. The neck pain did not get better when she stopped strength training as much. She does not do a lot of cardio and did not notice cardio specifically bothering her neck.   She had an episode of a similar headache that would not go away 3 years ago. She had Botox injections that helped until about May 2024. She put Botox in the lower neck region. She stopped the Botox because it was not helping any more. She has been told she has some scoliosis of her spine.  The last week her  headaches were a lot better while on vacation instead of working. 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 Her headache is constantly there, but the intensity can go up and down. She notices her neck pain is worse when her headaches are worse. She was recently diagnosed with seronegative RA after having a lot of pain in her left knee and right elbow and sometimes in low back.   Hand dominance: Right  PERTINENT HISTORY:  Patient is a 37 y.o. female who presents to outpatient physical therapy with a referral for medical diagnosis neck pain. This patient's chief complaints consist of chronic neck pain and constant right sided headache leading to the following functional deficits: difficulty working, looking down, lifting weights/working out, moving head up and down and side to side to see environment, sleeping, checking her blind spot when driving, prolonged positions. Relevant past medical history and comorbidities include has seronegative RA (recently diagnosed, not treated); Carrier of group B Streptococcus; Abnormal cervical Papanicolaou smear; Irregular periods; Oligomenorrhea; Overweight (BMI 25.0-29.9); Headache; Family history of hemochromatosis; Carrier of hemochromatosis HFE gene mutation; Chest pain; Epigastric pain; Abnormal TSH; Health maintenance examination; Vertigo; Allergic reaction to chemical substance; Blurry vision, right eye; and Inflammatory arthritis. She has a past medical history of Chest pain (07/09/2020), COVID-19 virus infection (11/2019), History of COVID-19 (11/25/2019), Inflammatory arthritis (04/27/2023), Normal pregnancy (06/12/2011), SVD (spontaneous vaginal delivery) (10/12/2016). She has a past surgical history that includes Wisdom tooth extraction; Tonsillectomy and adenoidectomy; Hysteroscopy; Colonoscopy (11/2022); and Esophagogastroduodenoscopy (11/2022). Patient denies hx of cancer, stroke, seizures, lung problems, heart problems, diabetes, unexplained  weight loss, unexplained changes in bowel or bladder problems, unexplained stumbling or dropping things, osteoporosis, and spinal surgery.   PAIN:  Are you having pain? Yes NPRS: Current: 4/10,  Best: 1/10, Worst: 7/10. Pain location: back of entire neck, bilateral upper traps proximal to bra straps, suboccipital region, can sometimes feel like it is going towards the right eye when headache is bad. Headache is right side of head and behind eye.  Pain description: feels really tight Aggravating factors: working, looking down a lot Relieving factors:  laying down to take off pressure of holding up head.    FUNCTIONAL LIMITATIONS: difficulty working, looking down, lifting weights/working out, moving head up and down and side to side to see environment, sleeping, checking her blind spot when driving, prolonged positions.   LEISURE: lifting weights, 2 girls in dance (7 and 12),   PRECAUTIONS: None  WEIGHT BEARING RESTRICTIONS: No  FALLS:  Has patient fallen in last 6 months? No No concerns about falling.   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   PLOF: Independent  PATIENT GOALS: "hoping it will help my headache" "to be able to not have all this tension"  NEXT MD VISIT: 07/12/2023  OBJECTIVE  DIAGNOSTIC FINDINGS:  Brain MRI report from 06/14/2023:  IMPRESSION: 1.  No evidence of an acute intracranial abnormality. 2. 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 3. Otherwise unremarkable MRI appearance of the brain.     Electronically Signed   By: Jackey Loge D.O.   On: 06/17/2023 18:19    SELF- REPORTED FUNCTION FOTO score: 24/100 (neck questionnaire)  OBSERVATION/INSPECTION Posture Posture (seated): forward head, rounded shoulders, left shoulder is slightly lower than right.   Anthropometrics Tremor: none Body composition: BMI: 28.8  Muscle bulk: WNL Skin: WNL  where visualized Edema: none Functional Mobility Bed mobility: supine <> sit and rolling WNL Transfers: sit <> stand WNL Gait: grossly WFL for household and short community ambulation. More detailed gait analysis deferred to later date as needed.   SPINE MOTION CERVICAL SPINE AROM *Indicates pain Flexion: 40 Extension: 45 (most motion from upper cervical spine), increased concordant pain left base of cervical spine, ERP.  Side Flexion:   R 32 increased concordant pain in back of neck  L 30 increased concordant pain in back of neck Rotation:  R 70 L 66 Protraction: WFL except increased concordant pain at CT junction Retraction: 1 1/8 inches, increased concordant pain in left upper neck  PERIPHERAL JOINT MOTION (in degrees) ACTIVE RANGE OF MOTION (AROM) Comments:  07/03/2023: B UE WNL  MUSCLE PERFORMANCE (MMT):  *Indicates pain 07/03/23 Date Date  Joint/Motion R/L R/L R/L  Shoulder     Flexion 4+/4+ / /  Abduction (C5) 4+/4+ / /  External rotation 4/4 / /  Internal rotation 4+/4+ / /  Elbow     Flexion (C6) 4+/4+ / /  Extension (C7) 4+/4+ / /  Hand     Thumb extension (C8) B WNL / /  Finger abduction (T1) B WNL / /  Comments:   SPECIAL TESTS: CERVICAL SPINE Cervical spine axial compression: increased CT junction pain Spurling's part B:  R = increased CT junction pain, L = increased CT junction pain Cervical spine axial distraction: improved pain  ACCESSORY MOTION: CPA to mid-lower cervical spine with concordant pain radiating slightly towards thoracic spine.  CPA to T1-T3 radiates towards cervical spine R > L Manual cervical traction in supine was uncomfortable due to pressure at right suboccipital region and did not seem to significantly alleviate neck pain or head ache.   PALPATION: TTP with reproduction of concordant neck and headache symptoms with palpation to right suboccipital muscles and cervical paraspinal muscles.   REPEATED MOTIONS TESTING: Repeated  cervical retraction AROM, 1x10 no effect Repeated cervical retraction with self overpressure, 1x10 no effect.  Repeated cervical retraction with clinician OP, 1x3 no effect Further reps/testing deferred due to time.   FUNCTIONAL TESTING Deep neck flexor  lift and hold: 30 seconds (stopped due to fatigue)   TODAY'S TREATMENT:   Manual therapy: to reduce pain and tissue tension, improve range of motion, neuromodulation, in order to promote improved ability to complete functional activities. SUPINE - STM to posterior cervical spine musculature focusing on sustained pressure at right suboccipital region and right cervical paraspinal muscles.   Therapeutic exercise: to centralize symptoms and improve ROM, strength, muscular endurance, and activity tolerance required for successful completion of functional activities.  - hooklying deep neck flexor nods, 1x10 with 5 second holds plus additional time to teach patient how to isolate movement.  - seated cervical spine retraction AROM, 1x10 - seated cervical spine retraction with self overpressure, 1x5 - seated posture correction with towel roll at lumbar spine and education on posture - seated cervical spine retraction with upper cervical spine flexion with self overpressure, 1x3 with varying hold times.  - Education on HEP including handout   Pt required multimodal cuing for proper technique and to facilitate improved neuromuscular control, strength, range of motion, and functional ability resulting in improved performance and form.  PATIENT EDUCATION:  Education details: Exercise purpose/form. Self management techniques. Education on diagnosis, prognosis, POC, anatomy and physiology of current condition. Education on HEP including handout. Person educated: Patient Education method: Explanation, Demonstration, Tactile cues, Verbal cues, and Handouts Education comprehension: verbalized understanding, returned demonstration, and needs further  education  HOME EXERCISE PROGRAM: Access Code: [email or web address] URL: [email or web address] Date: 07/03/2023 Prepared by: Norton Blizzard  Exercises - Seated Posture with Lumbar Roll  - Cervical Retraction with Overpressure  - 4 x daily - 2 sets - 10 reps - 1 second hold - Supine Head Nod Deep Neck Flexor Training  - 2 sets - 10 reps - 5 seocnds hold  HOME EXERCISE PROGRAM [[email or web address]] View at [email or web address]" using code: [email or web address] Sustained cervical flexion/retraction in sitting  -  Repeat 10 Repetitions, Hold 5 Seconds, Complete 2 Sets, Perform 2 Times a Day  ASSESSMENT:  CLINICAL IMPRESSION: Patient is a 37 y.o. female referred to outpatient physical therapy with a medical diagnosis of neck pain who presents with signs and symptoms consistent with chronic headache affecting right side of the head with apparent cervicogenic component and chronic neck pain. Patient's headache was strongly reproduced with palpation to the right suboccipital and cervical paraspinal muscles, suggesting cervical spine contribution to headache. Pateint also noted to have significant stiffness in extension near CT junction where she also has reproduction of neck pain and sits with a posture of forward head which likely irritates upper cervical spine and would benefit from interventions targeting upper cervical spine flexion and lower cervical spine extension. She does not appear to have any features of radiculopathy or UE involvement.  Patient presents with significant pain, posture, ROM, joint stiffness, muscle tension, muscle performance (strength/power/endurance) and activity tolerance impairments that are limiting ability to complete working, looking down, lifting weights/working out, moving head up and down and side to side to see environment, sleeping, checking her blind spot when driving, prolonged positions without difficulty. Patient will benefit from skilled physical therapy intervention to address current  body structure impairments and activity limitations to improve function and work towards goals set in current POC in order to return to prior level of function or maximal functional improvement.    OBJECTIVE IMPAIRMENTS: decreased activity tolerance, decreased endurance, decreased knowledge of condition, decreased ROM, decreased strength, hypomobility, impaired perceived functional ability, increased muscle spasms, impaired flexibility, improper body mechanics, postural dysfunction, and pain.   ACTIVITY LIMITATIONS:  sitting and sleeping  PARTICIPATION LIMITATIONS: interpersonal relationship, driving, community activity, occupation, and   difficulty working, looking down, lifting weights/working out, moving head up and down and side to side to see environment, sleeping, checking her blind spot when driving, prolonged positions  PERSONAL FACTORS: Past/current experiences, Profession, Time since onset of injury/illness/exacerbation, and 3+ comorbidities:   has seronegative RA (recently diagnosed, not treated); Carrier of group B Streptococcus; Abnormal cervical Papanicolaou smear; Irregular periods; Oligomenorrhea; Overweight (BMI 25.0-29.9); Headache; Family history of hemochromatosis; Carrier of hemochromatosis HFE gene mutation; Chest pain; Epigastric pain; Abnormal TSH; Health maintenance examination; Vertigo; Allergic reaction to chemical substance; Blurry vision, right eye; and Inflammatory arthritis. She has a past medical history of Chest pain (07/09/2020), COVID-19 virus infection (11/2019), History of COVID-19 (11/25/2019), Inflammatory arthritis (04/27/2023), Normal pregnancy (06/12/2011), SVD (spontaneous vaginal delivery) (10/12/2016). She has a past surgical history that includes Wisdom tooth extraction; Tonsillectomy and adenoidectomy; Hysteroscopy; Colonoscopy (11/2022); and Esophagogastroduodenoscopy (11/2022) are also affecting patient's functional outcome.   REHAB POTENTIAL:  Good  CLINICAL DECISION MAKING: Evolving/moderate complexity  EVALUATION COMPLEXITY: Moderate   GOALS: Goals reviewed with patient? No  SHORT TERM GOALS: Target date: 07/17/2023  Patient will be independent with initial home exercise program for self-management of symptoms. Baseline: Initial HEP provided at IE (07/03/23); Goal status: INITIAL   LONG TERM GOALS: Target date: 09/25/2023  Patient will be independent with a long-term home exercise program for self-management of symptoms.  Baseline: Initial HEP provided at IE (07/03/23); Goal status: INITIAL  2.  Patient will demonstrate improved FOTO to equal or greater than 66 by visit #10 to demonstrate improvement in overall condition and self-reported functional ability.  Baseline: 54 (07/03/23); Goal status: INITIAL  3.  Patient will report equal or less than 2 headache days per week to improve her ability to participate in her work and leisure activities with less difficulty.  Baseline: headache constant (07/03/23); Goal status: INITIAL  4.  Patient will demonstrate full cervical spine AROM without increased pain except temporary end range discomfort to improve her ability to check her blind spot and view her surroundings.  Baseline: painful and limited - see objective (07/03/23); Goal status: INITIAL  5.  Patient will complete community, work and/or recreational activities with 75% less limitation due to current condition.  Baseline: difficulty working, looking down, lifting weights/working out, moving head up and down and side to side to see environment, sleeping, checking her blind spot when driving, prolonged positions (07/03/23); Goal status: INITIAL  6.  Patient will report pain equal or less to 3/10 with functional activities to improve her ability to complete work tasks, sleep, and work out.  Baseline: up to 7/10 (07/03/23):  Goal status: INITIAL    PLAN:  PT FREQUENCY: 1-2x/week  PT DURATION: 12  weeks  PLANNED INTERVENTIONS: Therapeutic exercises, Therapeutic activity, Neuromuscular re-education, Patient/Family education, Self Care, Joint mobilization, Dry Needling, Electrical stimulation, Spinal mobilization, Cryotherapy, Moist heat, Manual therapy, and Re-evaluation  PLAN FOR NEXT SESSION: update HEP as appropriate, cervical retraction, upper cervical spine flexion based exercises, postural strengthening, manual therapy, education. Progressive functional/postural strengthening.    Cira Rue, PT, DPT 07/03/2023, 8:58 PM  [facility] [address] [place], [place] [address] P: [phone] I F: [phone]

## 2023-07-03 ENCOUNTER — Ambulatory Visit: Payer: BC Managed Care – PPO | Admitting: Physical Therapy

## 2023-07-03 ENCOUNTER — Encounter: Payer: Self-pay | Admitting: Physical Therapy

## 2023-07-03 DIAGNOSIS — M542 Cervicalgia: Secondary | ICD-10-CM | POA: Diagnosis not present

## 2023-07-03 DIAGNOSIS — R519 Headache, unspecified: Secondary | ICD-10-CM | POA: Diagnosis not present

## 2023-07-05 NOTE — Progress Notes (Deleted)
NEUROLOGY FOLLOW UP OFFICE NOTE  [name] [id number]  Subjective:  Martha Coleman is a 37 y.o. year old right-handed female with a medical history of GERD, seronegative RA who we last saw on 04/25/23.  To briefly review: Patient had a headache 3 years ago that required her to go to the ED. She had imaging that did not show anything. 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 She kept doing the botox every 6 months but the headache had resolved. The headache returned in mid-May 2024.   Patient also mentions having bad joint pain (progressive over last few years) all on the right side of her body. Nothing was found in her blood work. She was put on prednisone (2 week taper) in mid April 2024, which resolved her joint pain. Her headache returned in mid-May. Her joint pain also returned. She was then diagnosed with seronegative RA. Rheumatology is attempting to approve Enbrel, but patient is not currently on treatment for this.   The pain is on the right side of her head, particularly behind her head. It is a pressure sensation. She also has throbbing pain on the top of her head or back of head (right side) that can be sharp. The pain can wax and wane, from 4/10 to 9/10. The pain has not completely went away since mid-May. She endorses photophobia, phonophobia, and occasional nausea and vomiting. She may have some tearing from her right eye, but no rhinorrhea or abnormal sweating. She does endorse neck pain.   She endorses vision changes. She feels like she cannot focus. She denies vision loss or vision being less bright. She feels like the right eye lid is weaker (not drooping, but more difficult to close). She is seeing eye doctor in 05/2023.   She denies aura.   She prefers to lay down in a cold, quiet, dark room when her headaches get bad. She denies changes in quality of the headaches with position changes or valsalva. The headache is usually worse mid-day. The headache does not  usually wake her up at night. She will take ibuprofen or Goody's powder, but will not help much with her headache. Sudafed sinus will help some, but not eliminate her headache.   The only trigger she mentions is when moving her right eye. She has some dizziness when turning her head quickly as well or when moving the right eye.   Family with headaches? If so, what type? Brother with migraines   Social history: Caffiene use: 1 cup of coffee per day Tobacco use: None EtOH use: Once a week or every two weeks (1 glass of wine) Sleep: No problems Mood: More frustrated and anxiety lately after headache onset   Patient was previously on amitriptyline 10 mg as needed, for urinary issues after pregnancy a couple of years ago. She denies any side effects with that medication.    Of note, patient takes aldactone for acne.  Most recent Assessment and Plan (04/25/23): DALINDA Coleman is a 37 y.o. female who presents for evaluation of headache. She has a relevant medical history of GERD and seronegative RA. Her neurological examination is essentially normal today. The etiology of patient's headaches is currently unclear. The differential includes hemicrania continua, migraine without aura (status migrainosus), or IIH. There are no clear changes in headache with position, so IIH may be less likely, but is still considered with the non-specific vision changes. Migraine is possible, but the infrequency of headaches and length of current  headache is unusual. I would like to start with an indomethacin trial for hemicrania continua then expand work up or consider other treatments if she does not quickly respond as would be expected with hemicrania continua.   PLAN: -Indomethacin trial - 25 mg for 3 days, then 50 mg for 3 days, then 75 mg for 3 days: Take 1 capsule (25 mg) 3 times daily for 3 days, if no resolution to headache, then increase to 2 capsules (50 mg) 3 times daily for 3 days, if no resolution to  headache, then increase 3 capsules 3 times daily for 3 days -MRI brain w/wo contrast  Since their last visit: MRI brain was unremarkable. She saw an optometrist 05/07/23 who did not see anything of concern on her exam, per patient.  Indomethacin trial was first incomplete as patient was not able to tolerate indomethacin due to GI upset and not taking consistently. Given this, I stopped the trial then started Nortriptyline (10 mg for 1 week, then 20 mg thereafter) on 05/08/23. Patient messaged on 05/20/23 noting that nortriptyline was not helping and headaches were worsening. She noted a better response to indomethacin and preferred to give it another shot along with PPI. She took the indomethacin as prescribed and found that the indomethacin would help, especially at 75 mg dose, but would not last long (~4 hours). She noted the headaches would improve by 80-90% at the 75 mg dose.  I called patient on 05/30/23. Per my phone note: I called patient to discuss symptoms and response to indomethacin trial. Patient is finishing up her third day of 75 mg TID of indomethacin. She has about 75% reduction in headache with indomethacin. This is much better than when on Nortriptyline. She mentions that her headache got much worse when she stopped indomethacin and began Nortriptyline. Indomethacin is not working for more than about 4 hours though. She is on a PPI, but still has some issues with tolerating the indomethacin due to GI upset.   She again confirms that her headache is always present, on the right side of head, is worse when up (not laying down), worse mid-day, and associated with neck pain. She gets only occasional photophobia, phonophobia, nausea, and vomiting.   After discussion, patient's headaches do appear to be responding to indomethacin, as would be the case with hemicrania continua. Her headaches do no appear consistent with IIH. Migraines or new daily persistent headache is also possible. She has  significant neck pain, so cervicogenic headache could at least be contributing as well. Patient and I agreed on the following plan:   PT for neck pain Change indomethacin to long acting, indomethacin SR 75 mg BID Add gabapentin 300 mg TID   Patient to keep me up to date on response or problems with medications. All questions were answered.  Patient called on 05/31/23 with very severe headache on awaking, worse than normal, but same quality. She had started gabapentin the night before and indomethacin SR that morning. Patient came into the office for a headache cocktail (toradol, reglan, and benadryl). I saw her briefly. She looked very uncomfortable and nauseated. The next day, patient had some numbness in her thigh around the area of injection. ***  She then held all medications as headaches seemed to only be getting worse with medications. She started PT on 07/03/23 and was noted to have a very tight neck. Symptoms were reproduced with palpation of right suboccipital muscles and cervical paraspinal muscles.***  MEDICATIONS:  Outpatient Encounter Medications as  of 07/12/2023  Medication Sig   Cetirizine HCl (ZYRTEC ALLERGY PO) Take by mouth.   fluticasone (FLONASE) 50 MCG/ACT nasal spray USE TWO SPRAYS IN EACH NOSTRIL ONE TIME DAILY   gabapentin (NEURONTIN) 300 MG capsule Take 1 capsule (300 mg total) by mouth 3 (three) times daily. Take 1 capsule (300 mg) at bedtime for 7 days, if tolerated increased increase to 300 mg twice daily for 7 days, if tolerated increase to 300 mg three times daily thereafter   indomethacin (INDOCIN SR) 75 MG CR capsule Take 1 capsule (75 mg total) by mouth 2 (two) times daily with a meal.   scopolamine (TRANSDERM-SCOP) 1 MG/3DAYS Place 1 patch (1.5 mg total) onto the skin every 3 (three) days.   spironolactone (ALDACTONE) 100 MG tablet Take 100 mg by mouth daily. With food   No facility-administered encounter medications on file as of 07/12/2023.    PAST MEDICAL  HISTORY: Past Medical History:  Diagnosis Date   Chest pain 07/09/2020   COVID-19 virus infection 11/2019   Genital warts    Health maintenance examination 04/15/2021   History of COVID-19 11/25/2019   Inflammatory arthritis 04/27/2023   Normal pregnancy 06/12/2011   SVD (spontaneous vaginal delivery) 10/12/2016   Upper respiratory tract infection 04/23/2022    PAST SURGICAL HISTORY: Past Surgical History:  Procedure Laterality Date   COLONOSCOPY  11/2022   1cm SSP, rpt 3 yrs [address])   ESOPHAGOGASTRODUODENOSCOPY  11/2022   small HH, normal esophagus, biopsy with active inflammation and metaplasia but not endoscopically consistent with Barrett [address])   HYSTEROSCOPY     TONSILLECTOMY AND ADENOIDECTOMY     WISDOM TOOTH EXTRACTION      ALLERGIES: Allergies  Allergen Reactions   Doxycycline Rash    FAMILY HISTORY: Family History  Problem Relation Age of Onset   Hemochromatosis Mother        homozygous for H63D mutation   Hyperlipidemia Mother    Hypertension Mother    Hypertension Father    Migraines Brother    Diabetes Maternal Grandmother    Hypertension Maternal Grandmother    Breast cancer Maternal Aunt    Lung cancer Maternal Aunt    Cancer Neg Hx     SOCIAL HISTORY: Social History   Tobacco Use   Smoking status: Former    Current packs/day: 0.00    Types: Cigarettes    Quit date: 2014    Years since quitting: 10.6   Smokeless tobacco: Never  Vaping Use   Vaping status: Never Used  Substance Use Topics   Alcohol use: Yes    Alcohol/week: 0.0 standard drinks of alcohol    Comment: social    Drug use: No   Social History   Social History Narrative   Lives at home with family--1 level home   Right handed   Caffeine: maybe 1 cup/day      Objective:  Vital Signs:  There were no vitals taken for this visit.  ***  Labs and Imaging review: New results: MRI brain w/wo contrast (06/14/23): FINDINGS: Brain:   Cerebral volume is normal.   No  cortical encephalomalacia is identified. No significant cerebral white matter disease.   There is no acute infarct.   No evidence of an intracranial mass.   No chronic intracranial blood products.   No extra-axial fluid collection.   No midline shift.   No pathologic intracranial enhancement identified.   Vascular: Maintained flow voids within the proximal large arterial vessels. Developmental venous anomalies within the  anterior frontal lobes, bilaterally.   Skull and upper cervical spine: No focal suspicious marrow lesion.   Sinuses/Orbits: No mass or acute finding within the imaged orbits. No significant paranasal sinus disease.   IMPRESSION: 1.  No evidence of an acute intracranial abnormality. 2. 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 3. Otherwise unremarkable MRI appearance of the brain.  Previously reviewed results: Lab Results  Component Value Date    VITAMINB12 555 07/06/2020      Recent Labs       Lab Results  Component Value Date    TSH 3.95 04/19/2023      Recent Labs[] Expand by Default       Lab Results  Component Value Date    ESRSEDRATE 2 07/06/2020      CMP (04/13/22): unremarkable Ferritin (04/13/22): 22.8   Imaging: MRI brain w/wo contrast (09/22/19): FINDINGS:  No abnormal lesions are seen on diffusion-weighted views to suggest acute ischemia. The cortical sulci, fissures and cisterns are normal in size and appearance. Lateral, third and fourth ventricle are normal in size and appearance. No extra-axial fluid collections are seen. No evidence of mass effect or midline shift.  No abnormal lesions are seen on post contrast views.     On sagittal views the posterior fossa, pituitary gland and corpus callosum are unremarkable. No evidence of intracranial hemorrhage on gradient-echo views. The orbits and their contents, paranasal sinuses and calvarium are unremarkable.  Intracranial flow voids are  present.   IMPRESSION:  Normal MRI brain (with and without).    CTA head and neck (03/23/15): FINDINGS: CT HEAD   Brain: Normal cerebral volume. No midline shift, ventriculomegaly, mass effect, evidence of mass lesion, intracranial hemorrhage or evidence of cortically based acute infarction. Gray-white matter differentiation is within normal limits throughout the brain.   Calvarium and skull base: Normal.   Paranasal sinuses: Visualized paranasal sinuses and mastoids are clear.   Orbits: Visualized orbits and scalp soft tissues are within normal limits.   CTA NECK   Skeleton: No osseous abnormality identified.   Other neck: No cervical lymphadenopathy. Negative lung apices. No superior mediastinal lymphadenopathy. Thyroid, larynx, pharynx, parapharyngeal spaces, retropharyngeal space, sublingual space, submandibular glands, and parotid glands are within normal limits.   Aortic arch: 4 vessel arch configuration, the left vertebral artery arises directly from the arch. No arch atherosclerosis or great vessel stenosis.   Right carotid system: Streak artifact at the thoracic inlet related to dense left subclavian venous contrast. The right CCA origin appears normal. Negative right CCA. Normal right carotid bifurcation. Normal cervical right ICA.   Left carotid system: Normal left CCA origin. Negative left CCA. Normal left carotid bifurcation. Negative cervical left ICA.   Vertebral arteries:   No proximal right subclavian artery stenosis. Normal right vertebral artery origin. Mildly non dominant right vertebral artery is normal to the skullbase.   The left vertebral artery arises directly from the arch. No origin stenosis. It is mildly dominant, and normal to the skullbase.   CTA HEAD   Posterior circulation: Fairly codominant distal vertebral arteries. Normal PICA origins. Normal vertebrobasilar junction. Normal AICA origins. No basilar artery stenosis. Normal SCA  and left PCA origins. Fetal type right PCA origin. Both posterior communicating arteries are present. Bilateral PCA branches are within normal limits.   Anterior circulation: Normal ICA siphons. Normal ophthalmic and posterior communicating artery origins. Normal carotid termini. Normal MCA and ACA origins. Normal anterior communicating artery. Bilateral ACA branches are within normal limits. Left  MCA M1 segment and branches are within normal limits. Right MCA M1 segment and branches are within normal limits.   Venous sinuses: Normal.   Anatomic variants: Left vertebral artery is mildly dominant and arises directly from the aortic arch.   Delayed phase: No abnormal enhancement identified.   IMPRESSION: 1. Negative CTA head and neck. 2.  Normal CT appearance of the brain. 3. Negative neck soft tissues.  Assessment/Plan:  This is [place], a 37 y.o. female with: ***   Plan: ***  Return to clinic in ***  Total time spent reviewing records, interview, history/exam, documentation, and coordination of care on day of encounter:  *** min  Jacquelyne Balint, MD

## 2023-07-09 ENCOUNTER — Ambulatory Visit: Payer: BC Managed Care – PPO | Attending: Neurology

## 2023-07-09 DIAGNOSIS — M542 Cervicalgia: Secondary | ICD-10-CM | POA: Insufficient documentation

## 2023-07-09 DIAGNOSIS — R519 Headache, unspecified: Secondary | ICD-10-CM | POA: Diagnosis not present

## 2023-07-09 NOTE — Therapy (Signed)
OUTPATIENT PHYSICAL THERAPY TREATMENT   Patient Name: Martha Coleman MRN: [id number] DOB:1986-03-29, 37 y.o., female Today's Date: 07/09/2023  END OF SESSION:  PT End of Session - 07/09/23 1728     Visit Number 2    Number of Visits 13    Date for PT Re-Evaluation 09/25/23    Authorization Type [employer] COMM PPO reporting period from 07/03/2023    Authorization Time Period VL: 30 PT/OT/Chiro    Authorization - Number of Visits 30    Progress Note Due on Visit 10    PT Start Time 1730    PT Stop Time 1812    PT Time Calculation (min) 42 min    Activity Tolerance Patient tolerated treatment well    Behavior During Therapy [facility] for tasks assessed/performed              Past Medical History:  Diagnosis Date   Chest pain 07/09/2020   COVID-19 virus infection 11/2019   Genital warts    Health maintenance examination 04/15/2021   History of COVID-19 11/25/2019   Inflammatory arthritis 04/27/2023   Normal pregnancy 06/12/2011   SVD (spontaneous vaginal delivery) 10/12/2016   Upper respiratory tract infection 04/23/2022   Past Surgical History:  Procedure Laterality Date   COLONOSCOPY  11/2022   1cm SSP, rpt 3 yrs [address])   ESOPHAGOGASTRODUODENOSCOPY  11/2022   small HH, normal esophagus, biopsy with active inflammation and metaplasia but not endoscopically consistent with Barrett [address])   HYSTEROSCOPY     TONSILLECTOMY AND ADENOIDECTOMY     WISDOM TOOTH EXTRACTION     Patient Active Problem List   Diagnosis Date Noted   Blurry vision, right eye 04/27/2023   Inflammatory arthritis 04/27/2023   Vertigo 12/14/2022   Allergic reaction to chemical substance 12/14/2022   Health maintenance examination 04/15/2021   Abnormal TSH 04/14/2021   Chest pain 07/09/2020   Epigastric pain 07/09/2020   Carrier of hemochromatosis HFE gene mutation 09/19/2019   Family history of hemochromatosis 09/10/2019   Headache 08/13/2019   Abnormal cervical Papanicolaou smear 12/06/2017    Irregular periods 12/06/2017   Oligomenorrhea 12/06/2017   Overweight (BMI 25.0-29.9) 12/06/2017   Carrier of group B Streptococcus 09/07/2016    PCP: Eustaquio Boyden, MD  REFERRING PROVIDER: Antony Madura, MD (neurology)  REFERRING DIAG: neck pain  THERAPY DIAG:  Cervicalgia  Intractable headache, unspecified chronicity pattern, unspecified headache type  Rationale for Evaluation and Treatment: Rehabilitation  ONSET DATE: 2017  SUBJECTIVE:  SUBJECTIVE STATEMENT: Patient states she is coming to PT because she is having constant headaches on the right side of her head and behind her eye. She states it is "the whole thing" the right sie of the head when it is really bad. She states her doctor thought maybe the   Her neck pain started when she gave birth to her last child in 2017. She had an epidural which did not work properly, and every time she would push the button for the epidural the back of her neck would cramp up. Since then she had neck problems and it got even worse when she started working out in 2021 (weight training, heavy lifting). Since then she still works out intermittently. Her pain gradually got worse when she started strength training and there was no certain event. The neck pain did not get better when she stopped strength training as much. She does not do a lot of cardio and did not notice cardio specifically bothering her neck.   She had an episode of a similar headache that would not go away 3 years ago. She had Botox injections that helped until about May 2024. She put Botox in the lower neck region. She stopped the Botox because it was not helping any more. She has been told she has some scoliosis of her spine.  The last week her headaches were a lot better while on  vacation instead of working. 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 Her headache is constantly there, but the intensity can go up and down. She notices her neck pain is worse when her headaches are worse. She was recently diagnosed with seronegative RA after having a lot of pain in her left knee and right elbow and sometimes in low back.   Hand dominance: Right  PERTINENT HISTORY:  Patient is a 37 y.o. female who presents to outpatient physical therapy with a referral for medical diagnosis neck pain. This patient's chief complaints consist of chronic neck pain and constant right sided headache leading to the following functional deficits: difficulty working, looking down, lifting weights/working out, moving head up and down and side to side to see environment, sleeping, checking her blind spot when driving, prolonged positions. Relevant past medical history and comorbidities include has seronegative RA (recently diagnosed, not treated); Carrier of group B Streptococcus; Abnormal cervical Papanicolaou smear; Irregular periods; Oligomenorrhea; Overweight (BMI 25.0-29.9); Headache; Family history of hemochromatosis; Carrier of hemochromatosis HFE gene mutation; Chest pain; Epigastric pain; Abnormal TSH; Health maintenance examination; Vertigo; Allergic reaction to chemical substance; Blurry vision, right eye; and Inflammatory arthritis. She has a past medical history of Chest pain (07/09/2020), COVID-19 virus infection (11/2019), History of COVID-19 (11/25/2019), Inflammatory arthritis (04/27/2023), Normal pregnancy (06/12/2011), SVD (spontaneous vaginal delivery) (10/12/2016). She has a past surgical history that includes Wisdom tooth extraction; Tonsillectomy and adenoidectomy; Hysteroscopy; Colonoscopy (11/2022); and Esophagogastroduodenoscopy (11/2022). Patient denies hx of cancer, stroke, seizures, lung problems, heart problems, diabetes, unexplained weight loss, unexplained changes in bowel  or bladder problems, unexplained stumbling or dropping things, osteoporosis, and spinal surgery.   PAIN:  Are you having pain? Yes NPRS: Current: 4/10,  Best: 1/10, Worst: 7/10. Pain location: back of entire neck, bilateral upper traps proximal to bra straps, suboccipital region, can sometimes feel like it is going towards the right eye when headache is bad. Headache is right side of head and behind eye.  Pain description: feels really tight Aggravating factors: working, looking down a lot Relieving factors:  laying down to take off pressure of holding up head.    FUNCTIONAL LIMITATIONS: difficulty working, looking down, lifting weights/working out, moving head up and down and side to side to see environment, sleeping, checking her blind spot when driving, prolonged positions.   LEISURE: lifting weights, 2 girls in dance (7 and 12),   PRECAUTIONS: None  WEIGHT BEARING RESTRICTIONS: No  FALLS:  Has patient fallen in last 6 months? No No concerns about falling.   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   PLOF: Independent  PATIENT GOALS: "hoping it will help my headache" "to be able to not have all this tension"  NEXT MD VISIT: 07/12/2023  OBJECTIVE  DIAGNOSTIC FINDINGS:  Brain MRI report from 06/14/2023:  IMPRESSION: 1.  No evidence of an acute intracranial abnormality. 2. 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 3. Otherwise unremarkable MRI appearance of the brain.     Electronically Signed   By: Jackey Loge D.O.   On: 06/17/2023 18:19    SELF- REPORTED FUNCTION FOTO score: 24/100 (neck questionnaire)  OBSERVATION/INSPECTION Posture Posture (seated): forward head, rounded shoulders, left shoulder is slightly lower than right.   Anthropometrics Tremor: none Body composition: BMI: 28.8  Muscle bulk: WNL Skin: WNL where visualized Edema: none Functional  Mobility Bed mobility: supine <> sit and rolling WNL Transfers: sit <> stand WNL Gait: grossly WFL for household and short community ambulation. More detailed gait analysis deferred to later date as needed.   SPINE MOTION CERVICAL SPINE AROM *Indicates pain Flexion: 40 Extension: 45 (most motion from upper cervical spine), increased concordant pain left base of cervical spine, ERP.  Side Flexion:   R 32 increased concordant pain in back of neck  L 30 increased concordant pain in back of neck Rotation:  R 70 L 66 Protraction: WFL except increased concordant pain at CT junction Retraction: 1 1/8 inches, increased concordant pain in left upper neck  PERIPHERAL JOINT MOTION (in degrees) ACTIVE RANGE OF MOTION (AROM) Comments:  07/03/2023: B UE WNL  MUSCLE PERFORMANCE (MMT):  *Indicates pain 07/03/23 Date Date  Joint/Motion R/L R/L R/L  Shoulder     Flexion 4+/4+ / /  Abduction (C5) 4+/4+ / /  External rotation 4/4 / /  Internal rotation 4+/4+ / /  Elbow     Flexion (C6) 4+/4+ / /  Extension (C7) 4+/4+ / /  Hand     Thumb extension (C8) B WNL / /  Finger abduction (T1) B WNL / /  Comments:   SPECIAL TESTS: CERVICAL SPINE Cervical spine axial compression: increased CT junction pain Spurling's part B:  R = increased CT junction pain, L = increased CT junction pain Cervical spine axial distraction: improved pain  ACCESSORY MOTION: CPA to mid-lower cervical spine with concordant pain radiating slightly towards thoracic spine.  CPA to T1-T3 radiates towards cervical spine R > L Manual cervical traction in supine was uncomfortable due to pressure at right suboccipital region and did not seem to significantly alleviate neck pain or head ache.   PALPATION: TTP with reproduction of concordant neck and headache symptoms with palpation to right suboccipital muscles and cervical paraspinal muscles.   REPEATED MOTIONS TESTING: Repeated cervical retraction AROM, 1x10 no  effect Repeated cervical retraction with self overpressure, 1x10 no effect.  Repeated cervical retraction with clinician OP, 1x3 no effect Further reps/testing deferred due to time.   FUNCTIONAL TESTING Deep neck flexor  lift and hold: 30 seconds (stopped due to fatigue)   TODAY'S TREATMENT:   Subjective: Patient reports feeling worse since last session. Unsure if due to being back at work or doing exercises.    Manual therapy: to reduce pain and tissue tension, improve range of motion, neuromodulation, in order to promote improved ability to complete functional activities. SUPINE - STM to posterior cervical spine musculature, rhomboids, and B UT/levator  - Cervical retraction x 2 minutes - 10 second on/5 second off  -Suboccipital release 5 x 15 seconds  -B UT stretch with ipsilateral GHJ overpressure by PT 3 x 30 seconds each side  Therapeutic exercise: to centralize symptoms and improve ROM, strength, muscular endurance, and activity tolerance required for successful completion of functional activities.  -seated UT stretch while ipsilateral hand is anchored under buttocks 3 x 30 seconds  -seated levator stretch while ipsilateral hand is anchored under buttocks 3 x 30 seconds  -seated row at [facility] machine 25# 2 x 10 (last set with compensation), 20# 1 x 10   -seated chest press at [facility] machine 15# 3 x 10   - Education on HEP including handout   Pt required multimodal cuing for proper technique and to facilitate improved neuromuscular control, strength, range of motion, and functional ability resulting in improved performance and form.  PATIENT EDUCATION:  Education details: Exercise purpose/form. Self management techniques. Education on diagnosis, prognosis, POC, anatomy and physiology of current condition. Education on HEP including handout. Person educated: Patient Education method: Explanation, Demonstration, Tactile cues, Verbal cues, and Handouts Education comprehension:  verbalized understanding, returned demonstration, and needs further education  HOME EXERCISE PROGRAM: Access Code: [email or web address] URL: [email or web address] Date: 07/03/2023 Prepared by: Norton Blizzard  Exercises - Seated Posture with Lumbar Roll  - Cervical Retraction with Overpressure  - 4 x daily - 2 sets - 10 reps - 1 second hold - Supine Head Nod Deep Neck Flexor Training  - 2 sets - 10 reps - 5 seconds hold  HOME EXERCISE PROGRAM [[email or web address]] View at [email or web address]" using code: [email or web address] Sustained cervical flexion/retraction in sitting  -  Repeat 10 Repetitions, Hold 5 Seconds, Complete 2 Sets, Perform 2 Times a Day  Access Code: [email or web address] URL: [email or web address] Date: 07/09/2023 Prepared by: Maylon Peppers  Exercises - Seated Upper Trapezius Stretch  - 2-3 x daily - 5-7 x weekly - 5 reps - 30 second hold - Seated Levator Scapulae Stretch  - 2-3 x daily - 5-7 x weekly - 5 reps - 30 second hold  ASSESSMENT:  CLINICAL IMPRESSION:  Patient arrives to treatment session with mild headache. Session focused on manual therapy/stretching and light scapular strengthening. Tolerated treatment session well with no increase in pain. Provided patient with HEP focused on UT and levator stretching to be able to self stretch at work to relieve pain. Discussed self massage to suboccipital and cervical paraspinals when pain increases. Patient will benefit from skilled physical therapy intervention to address current body structure impairments and activity limitations to improve function and work towards goals set in current POC in order to return to prior level of function or maximal functional improvement.    OBJECTIVE IMPAIRMENTS: decreased activity tolerance, decreased endurance, decreased knowledge of condition, decreased ROM, decreased strength, hypomobility, impaired perceived functional ability, increased muscle spasms, impaired flexibility, improper body mechanics,  postural dysfunction, and pain.   ACTIVITY LIMITATIONS: sitting and sleeping  PARTICIPATION LIMITATIONS: interpersonal relationship, driving, community activity, occupation, and   difficulty working, looking down, lifting weights/working out, moving head up and  down and side to side to see environment, sleeping, checking her blind spot when driving, prolonged positions  PERSONAL FACTORS: Past/current experiences, Profession, Time since onset of injury/illness/exacerbation, and 3+ comorbidities:   has seronegative RA (recently diagnosed, not treated); Carrier of group B Streptococcus; Abnormal cervical Papanicolaou smear; Irregular periods; Oligomenorrhea; Overweight (BMI 25.0-29.9); Headache; Family history of hemochromatosis; Carrier of hemochromatosis HFE gene mutation; Chest pain; Epigastric pain; Abnormal TSH; Health maintenance examination; Vertigo; Allergic reaction to chemical substance; Blurry vision, right eye; and Inflammatory arthritis. She has a past medical history of Chest pain (07/09/2020), COVID-19 virus infection (11/2019), History of COVID-19 (11/25/2019), Inflammatory arthritis (04/27/2023), Normal pregnancy (06/12/2011), SVD (spontaneous vaginal delivery) (10/12/2016). She has a past surgical history that includes Wisdom tooth extraction; Tonsillectomy and adenoidectomy; Hysteroscopy; Colonoscopy (11/2022); and Esophagogastroduodenoscopy (11/2022) are also affecting patient's functional outcome.   REHAB POTENTIAL: Good  CLINICAL DECISION MAKING: Evolving/moderate complexity  EVALUATION COMPLEXITY: Moderate   GOALS: Goals reviewed with patient? No  SHORT TERM GOALS: Target date: 07/17/2023  Patient will be independent with initial home exercise program for self-management of symptoms. Baseline: Initial HEP provided at IE (07/03/23); Goal status: INITIAL   LONG TERM GOALS: Target date: 09/25/2023  Patient will be independent with a long-term home exercise program for  self-management of symptoms.  Baseline: Initial HEP provided at IE (07/03/23); Goal status: INITIAL  2.  Patient will demonstrate improved FOTO to equal or greater than 66 by visit #10 to demonstrate improvement in overall condition and self-reported functional ability.  Baseline: 54 (07/03/23); Goal status: INITIAL  3.  Patient will report equal or less than 2 headache days per week to improve her ability to participate in her work and leisure activities with less difficulty.  Baseline: headache constant (07/03/23); Goal status: INITIAL  4.  Patient will demonstrate full cervical spine AROM without increased pain except temporary end range discomfort to improve her ability to check her blind spot and view her surroundings.  Baseline: painful and limited - see objective (07/03/23); Goal status: INITIAL  5.  Patient will complete community, work and/or recreational activities with 75% less limitation due to current condition.  Baseline: difficulty working, looking down, lifting weights/working out, moving head up and down and side to side to see environment, sleeping, checking her blind spot when driving, prolonged positions (07/03/23); Goal status: INITIAL  6.  Patient will report pain equal or less to 3/10 with functional activities to improve her ability to complete work tasks, sleep, and work out.  Baseline: up to 7/10 (07/03/23):  Goal status: INITIAL    PLAN:  PT FREQUENCY: 1-2x/week  PT DURATION: 12 weeks  PLANNED INTERVENTIONS: Therapeutic exercises, Therapeutic activity, Neuromuscular re-education, Patient/Family education, Self Care, Joint mobilization, Dry Needling, Electrical stimulation, Spinal mobilization, Cryotherapy, Moist heat, Manual therapy, and Re-evaluation  PLAN FOR NEXT SESSION: update HEP as appropriate, cervical retraction, upper cervical spine flexion based exercises, postural strengthening, manual therapy, education. Progressive functional/postural  strengthening.    Viviann Spare, PT, DPT 07/09/2023, 5:28 PM  [facility] [address] [place], [place] [address] P: [phone] I F: [phone]

## 2023-07-10 ENCOUNTER — Ambulatory Visit: Payer: BC Managed Care – PPO | Admitting: Physical Therapy

## 2023-07-10 ENCOUNTER — Ambulatory Visit: Payer: BC Managed Care – PPO

## 2023-07-10 NOTE — Therapy (Signed)
OUTPATIENT PHYSICAL THERAPY TREATMENT   Patient Name: Martha Coleman MRN: [id number] DOB:10-01-1986, 37 y.o., female Today's Date: 07/11/2023  END OF SESSION:  PT End of Session - 07/11/23 1728     Visit Number 3    Number of Visits 13    Date for PT Re-Evaluation 09/25/23    Authorization Type [employer] COMM PPO reporting period from 07/03/2023    Authorization Time Period VL: 30 PT/OT/Chiro    Authorization - Number of Visits 30    Progress Note Due on Visit 10    PT Start Time 1728    PT Stop Time 1810    PT Time Calculation (min) 42 min    Activity Tolerance Patient tolerated treatment well    Behavior During Therapy [facility] for tasks assessed/performed               Past Medical History:  Diagnosis Date   Chest pain 07/09/2020   COVID-19 virus infection 11/2019   Genital warts    Health maintenance examination 04/15/2021   History of COVID-19 11/25/2019   Inflammatory arthritis 04/27/2023   Normal pregnancy 06/12/2011   SVD (spontaneous vaginal delivery) 10/12/2016   Upper respiratory tract infection 04/23/2022   Past Surgical History:  Procedure Laterality Date   COLONOSCOPY  11/2022   1cm SSP, rpt 3 yrs [address])   ESOPHAGOGASTRODUODENOSCOPY  11/2022   small HH, normal esophagus, biopsy with active inflammation and metaplasia but not endoscopically consistent with Barrett [address])   HYSTEROSCOPY     TONSILLECTOMY AND ADENOIDECTOMY     WISDOM TOOTH EXTRACTION     Patient Active Problem List   Diagnosis Date Noted   Blurry vision, right eye 04/27/2023   Inflammatory arthritis 04/27/2023   Vertigo 12/14/2022   Allergic reaction to chemical substance 12/14/2022   Health maintenance examination 04/15/2021   Abnormal TSH 04/14/2021   Chest pain 07/09/2020   Epigastric pain 07/09/2020   Carrier of hemochromatosis HFE gene mutation 09/19/2019   Family history of hemochromatosis 09/10/2019   Headache 08/13/2019   Abnormal cervical Papanicolaou smear 12/06/2017    Irregular periods 12/06/2017   Oligomenorrhea 12/06/2017   Overweight (BMI 25.0-29.9) 12/06/2017   Carrier of group B Streptococcus 09/07/2016    PCP: Eustaquio Boyden, MD  REFERRING PROVIDER: Antony Madura, MD (neurology)  REFERRING DIAG: neck pain  THERAPY DIAG:  Cervicalgia  Intractable headache, unspecified chronicity pattern, unspecified headache type  Rationale for Evaluation and Treatment: Rehabilitation  ONSET DATE: 2017  SUBJECTIVE:  SUBJECTIVE STATEMENT: Patient states she is coming to PT because she is having constant headaches on the right side of her head and behind her eye. She states it is "the whole thing" the right sie of the head when it is really bad. She states her doctor thought maybe the   Her neck pain started when she gave birth to her last child in 2017. She had an epidural which did not work properly, and every time she would push the button for the epidural the back of her neck would cramp up. Since then she had neck problems and it got even worse when she started working out in 2021 (weight training, heavy lifting). Since then she still works out intermittently. Her pain gradually got worse when she started strength training and there was no certain event. The neck pain did not get better when she stopped strength training as much. She does not do a lot of cardio and did not notice cardio specifically bothering her neck.   She had an episode of a similar headache that would not go away 3 years ago. She had Botox injections that helped until about May 2024. She put Botox in the lower neck region. She stopped the Botox because it was not helping any more. She has been told she has some scoliosis of her spine.  The last week her headaches were a lot better while on  vacation instead of working. 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 Her headache is constantly there, but the intensity can go up and down. She notices her neck pain is worse when her headaches are worse. She was recently diagnosed with seronegative RA after having a lot of pain in her left knee and right elbow and sometimes in low back.   Hand dominance: Right  PERTINENT HISTORY:  Patient is a 37 y.o. female who presents to outpatient physical therapy with a referral for medical diagnosis neck pain. This patient's chief complaints consist of chronic neck pain and constant right sided headache leading to the following functional deficits: difficulty working, looking down, lifting weights/working out, moving head up and down and side to side to see environment, sleeping, checking her blind spot when driving, prolonged positions. Relevant past medical history and comorbidities include has seronegative RA (recently diagnosed, not treated); Carrier of group B Streptococcus; Abnormal cervical Papanicolaou smear; Irregular periods; Oligomenorrhea; Overweight (BMI 25.0-29.9); Headache; Family history of hemochromatosis; Carrier of hemochromatosis HFE gene mutation; Chest pain; Epigastric pain; Abnormal TSH; Health maintenance examination; Vertigo; Allergic reaction to chemical substance; Blurry vision, right eye; and Inflammatory arthritis. She has a past medical history of Chest pain (07/09/2020), COVID-19 virus infection (11/2019), History of COVID-19 (11/25/2019), Inflammatory arthritis (04/27/2023), Normal pregnancy (06/12/2011), SVD (spontaneous vaginal delivery) (10/12/2016). She has a past surgical history that includes Wisdom tooth extraction; Tonsillectomy and adenoidectomy; Hysteroscopy; Colonoscopy (11/2022); and Esophagogastroduodenoscopy (11/2022). Patient denies hx of cancer, stroke, seizures, lung problems, heart problems, diabetes, unexplained weight loss, unexplained changes in bowel  or bladder problems, unexplained stumbling or dropping things, osteoporosis, and spinal surgery.   PAIN:  Are you having pain? Yes NPRS: Current: 4/10,  Best: 1/10, Worst: 7/10. Pain location: back of entire neck, bilateral upper traps proximal to bra straps, suboccipital region, can sometimes feel like it is going towards the right eye when headache is bad. Headache is right side of head and behind eye.  Pain description: feels really tight Aggravating factors: working, looking down a lot Relieving factors:  laying down to take off pressure of holding up head.    FUNCTIONAL LIMITATIONS: difficulty working, looking down, lifting weights/working out, moving head up and down and side to side to see environment, sleeping, checking her blind spot when driving, prolonged positions.   LEISURE: lifting weights, 2 girls in dance (7 and 12),   PRECAUTIONS: None  WEIGHT BEARING RESTRICTIONS: No  FALLS:  Has patient fallen in last 6 months? No No concerns about falling.   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   PLOF: Independent  PATIENT GOALS: "hoping it will help my headache" "to be able to not have all this tension"  NEXT MD VISIT: 07/12/2023  OBJECTIVE  DIAGNOSTIC FINDINGS:  Brain MRI report from 06/14/2023:  IMPRESSION: 1.  No evidence of an acute intracranial abnormality. 2. 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 3. Otherwise unremarkable MRI appearance of the brain.     Electronically Signed   By: Jackey Loge D.O.   On: 06/17/2023 18:19    SELF- REPORTED FUNCTION FOTO score: 24/100 (neck questionnaire)  OBSERVATION/INSPECTION Posture Posture (seated): forward head, rounded shoulders, left shoulder is slightly lower than right.   Anthropometrics Tremor: none Body composition: BMI: 28.8  Muscle bulk: WNL Skin: WNL where visualized Edema: none Functional  Mobility Bed mobility: supine <> sit and rolling WNL Transfers: sit <> stand WNL Gait: grossly WFL for household and short community ambulation. More detailed gait analysis deferred to later date as needed.   SPINE MOTION CERVICAL SPINE AROM *Indicates pain Flexion: 40 Extension: 45 (most motion from upper cervical spine), increased concordant pain left base of cervical spine, ERP.  Side Flexion:   R 32 increased concordant pain in back of neck  L 30 increased concordant pain in back of neck Rotation:  R 70 L 66 Protraction: WFL except increased concordant pain at CT junction Retraction: 1 1/8 inches, increased concordant pain in left upper neck  PERIPHERAL JOINT MOTION (in degrees) ACTIVE RANGE OF MOTION (AROM) Comments:  07/03/2023: B UE WNL  MUSCLE PERFORMANCE (MMT):  *Indicates pain 07/03/23 Date Date  Joint/Motion R/L R/L R/L  Shoulder     Flexion 4+/4+ / /  Abduction (C5) 4+/4+ / /  External rotation 4/4 / /  Internal rotation 4+/4+ / /  Elbow     Flexion (C6) 4+/4+ / /  Extension (C7) 4+/4+ / /  Hand     Thumb extension (C8) B WNL / /  Finger abduction (T1) B WNL / /  Comments:   SPECIAL TESTS: CERVICAL SPINE Cervical spine axial compression: increased CT junction pain Spurling's part B:  R = increased CT junction pain, L = increased CT junction pain Cervical spine axial distraction: improved pain  ACCESSORY MOTION: CPA to mid-lower cervical spine with concordant pain radiating slightly towards thoracic spine.  CPA to T1-T3 radiates towards cervical spine R > L Manual cervical traction in supine was uncomfortable due to pressure at right suboccipital region and did not seem to significantly alleviate neck pain or head ache.   PALPATION: TTP with reproduction of concordant neck and headache symptoms with palpation to right suboccipital muscles and cervical paraspinal muscles.   REPEATED MOTIONS TESTING: Repeated cervical retraction AROM, 1x10 no  effect Repeated cervical retraction with self overpressure, 1x10 no effect.  Repeated cervical retraction with clinician OP, 1x3 no effect Further reps/testing deferred due to time.   FUNCTIONAL TESTING Deep neck flexor  lift and hold: 30 seconds (stopped due to fatigue)   TODAY'S TREATMENT:   Subjective: Patient reports feeling some soreness after last session but less headache from before.    Manual therapy: to reduce pain and tissue tension, improve range of motion, neuromodulation, in order to promote improved ability to complete functional activities. SUPINE - STM to posterior cervical spine musculature, rhomboids, and B UT/levator  - Cervical distraction x 2 minutes - 10 second on/5 second off  -Suboccipital release 5 x 15 seconds  -B UT stretch with ipsilateral GHJ overpressure by PT 3 x 30 seconds each side  Therapeutic exercise: to centralize symptoms and improve ROM, strength, muscular endurance, and activity tolerance required for successful completion of functional activities.   -seated UT stretch while ipsilateral hand is anchored under buttocks 3 x 30 seconds each side  -seated levator stretch while ipsilateral hand is anchored under buttocks 3 x 30 seconds each side  -seated row at [employer] machine 20#  3 x 10  -seated chest press at [facility] machine 15# 3 x 10  -standing shoulder horizontal abduction with BTB 3 x 10 -standing shoulder diagonals with BTB 3 x 10 each direction   Patient required multimodal cuing for proper technique and to facilitate improved neuromuscular control, strength, range of motion, and functional ability resulting in improved performance and form.  PATIENT EDUCATION:  Education details: Exercise purpose/form. Self management techniques. Education on diagnosis, prognosis, POC, anatomy and physiology of current condition. Education on HEP including handout. Person educated: Patient Education method: Explanation, Demonstration, Tactile cues, Verbal  cues, and Handouts Education comprehension: verbalized understanding, returned demonstration, and needs further education  HOME EXERCISE PROGRAM: Access Code: [email or web address] URL: [email or web address] Date: 07/03/2023 Prepared by: Norton Blizzard  Exercises - Seated Posture with Lumbar Roll  - Cervical Retraction with Overpressure  - 4 x daily - 2 sets - 10 reps - 1 second hold - Supine Head Nod Deep Neck Flexor Training  - 2 sets - 10 reps - 5 seconds hold  HOME EXERCISE PROGRAM [[email or web address]] View at [email or web address]" using code: [email or web address] Sustained cervical flexion/retraction in sitting  -  Repeat 10 Repetitions, Hold 5 Seconds, Complete 2 Sets, Perform 2 Times a Day  Access Code: [email or web address] URL: [email or web address] Date: 07/09/2023 Prepared by: Maylon Peppers  Exercises - Seated Upper Trapezius Stretch  - 2-3 x daily - 5-7 x weekly - 5 reps - 30 second hold - Seated Levator Scapulae Stretch  - 2-3 x daily - 5-7 x weekly - 5 reps - 30 second hold  Access Code: [email or web address] URL: [email or web address] Date: 07/11/2023 Prepared by: Maylon Peppers  Exercises - Standing Shoulder Horizontal Abduction with Resistance  - 2-3 x daily - 5-7 x weekly - 3 sets - 10 reps - Standing Shoulder Diagonal Horizontal Abduction 60/120 Degrees with Resistance  - 2-3 x daily - 5-7 x weekly - 3 sets - 10 reps  ASSESSMENT:  CLINICAL IMPRESSION:   Patient arrives to treatment session with improved headache symptoms. Session focused on manual therapy/stretching and light scapular strengthening. Tolerated treatment session well with no increase in pain and reports of improved neck mobility. Provided patient with blue theraband and shoulder strengthening exercises via HEP handout. Patient will benefit from skilled physical therapy intervention to address current body structure impairments and activity limitations to improve function and work towards goals set in current POC in  order to return to prior level of function or maximal functional improvement.    OBJECTIVE IMPAIRMENTS: decreased activity tolerance, decreased endurance, decreased knowledge  of condition, decreased ROM, decreased strength, hypomobility, impaired perceived functional ability, increased muscle spasms, impaired flexibility, improper body mechanics, postural dysfunction, and pain.   ACTIVITY LIMITATIONS: sitting and sleeping  PARTICIPATION LIMITATIONS: interpersonal relationship, driving, community activity, occupation, and   difficulty working, looking down, lifting weights/working out, moving head up and down and side to side to see environment, sleeping, checking her blind spot when driving, prolonged positions  PERSONAL FACTORS: Past/current experiences, Profession, Time since onset of injury/illness/exacerbation, and 3+ comorbidities:   has seronegative RA (recently diagnosed, not treated); Carrier of group B Streptococcus; Abnormal cervical Papanicolaou smear; Irregular periods; Oligomenorrhea; Overweight (BMI 25.0-29.9); Headache; Family history of hemochromatosis; Carrier of hemochromatosis HFE gene mutation; Chest pain; Epigastric pain; Abnormal TSH; Health maintenance examination; Vertigo; Allergic reaction to chemical substance; Blurry vision, right eye; and Inflammatory arthritis. She has a past medical history of Chest pain (07/09/2020), COVID-19 virus infection (11/2019), History of COVID-19 (11/25/2019), Inflammatory arthritis (04/27/2023), Normal pregnancy (06/12/2011), SVD (spontaneous vaginal delivery) (10/12/2016). She has a past surgical history that includes Wisdom tooth extraction; Tonsillectomy and adenoidectomy; Hysteroscopy; Colonoscopy (11/2022); and Esophagogastroduodenoscopy (11/2022) are also affecting patient's functional outcome.   REHAB POTENTIAL: Good  CLINICAL DECISION MAKING: Evolving/moderate complexity  EVALUATION COMPLEXITY: Moderate   GOALS: Goals reviewed with  patient? No  SHORT TERM GOALS: Target date: 07/17/2023  Patient will be independent with initial home exercise program for self-management of symptoms. Baseline: Initial HEP provided at IE (07/03/23); Goal status: INITIAL   LONG TERM GOALS: Target date: 09/25/2023  Patient will be independent with a long-term home exercise program for self-management of symptoms.  Baseline: Initial HEP provided at IE (07/03/23); Goal status: INITIAL  2.  Patient will demonstrate improved FOTO to equal or greater than 66 by visit #10 to demonstrate improvement in overall condition and self-reported functional ability.  Baseline: 54 (07/03/23); Goal status: INITIAL  3.  Patient will report equal or less than 2 headache days per week to improve her ability to participate in her work and leisure activities with less difficulty.  Baseline: headache constant (07/03/23); Goal status: INITIAL  4.  Patient will demonstrate full cervical spine AROM without increased pain except temporary end range discomfort to improve her ability to check her blind spot and view her surroundings.  Baseline: painful and limited - see objective (07/03/23); Goal status: INITIAL  5.  Patient will complete community, work and/or recreational activities with 75% less limitation due to current condition.  Baseline: difficulty working, looking down, lifting weights/working out, moving head up and down and side to side to see environment, sleeping, checking her blind spot when driving, prolonged positions (07/03/23); Goal status: INITIAL  6.  Patient will report pain equal or less to 3/10 with functional activities to improve her ability to complete work tasks, sleep, and work out.  Baseline: up to 7/10 (07/03/23):  Goal status: INITIAL    PLAN:  PT FREQUENCY: 1-2x/week  PT DURATION: 12 weeks  PLANNED INTERVENTIONS: Therapeutic exercises, Therapeutic activity, Neuromuscular re-education, Patient/Family education, Self Care,  Joint mobilization, Dry Needling, Electrical stimulation, Spinal mobilization, Cryotherapy, Moist heat, Manual therapy, and Re-evaluation  PLAN FOR NEXT SESSION: update HEP as appropriate, cervical retraction, upper cervical spine flexion based exercises, postural strengthening, manual therapy, education. Progressive functional/postural strengthening.    Viviann Spare, PT, DPT 07/11/2023, 6:10 PM  [facility] [address] [place], [place] [address] P: [phone] I F: [phone]

## 2023-07-11 ENCOUNTER — Ambulatory Visit: Payer: BC Managed Care – PPO

## 2023-07-11 DIAGNOSIS — R519 Headache, unspecified: Secondary | ICD-10-CM

## 2023-07-11 DIAGNOSIS — M542 Cervicalgia: Secondary | ICD-10-CM | POA: Diagnosis not present

## 2023-07-12 ENCOUNTER — Ambulatory Visit: Payer: BC Managed Care – PPO | Admitting: Neurology

## 2023-07-16 ENCOUNTER — Encounter: Payer: BC Managed Care – PPO | Admitting: Physical Therapy

## 2023-07-16 ENCOUNTER — Ambulatory Visit: Payer: BC Managed Care – PPO | Admitting: Physical Therapy

## 2023-07-16 ENCOUNTER — Encounter: Payer: Self-pay | Admitting: Physical Therapy

## 2023-07-16 DIAGNOSIS — M542 Cervicalgia: Secondary | ICD-10-CM | POA: Diagnosis not present

## 2023-07-16 DIAGNOSIS — R519 Headache, unspecified: Secondary | ICD-10-CM

## 2023-07-16 NOTE — Therapy (Signed)
OUTPATIENT PHYSICAL THERAPY TREATMENT   Patient Name: Martha Coleman MRN: [id number] DOB:1986/08/03, 37 y.o., female Today's Date: 07/16/2023  END OF SESSION:  PT End of Session - 07/16/23 1930     Visit Number 4    Number of Visits 13    Date for PT Re-Evaluation 09/25/23    Authorization Type [employer] COMM PPO reporting period from 07/03/2023    Authorization Time Period VL: 30 PT/OT/Chiro    Authorization - Visit Number 4    Authorization - Number of Visits 30    Progress Note Due on Visit 10    PT Start Time 1737    PT Stop Time 1815    PT Time Calculation (min) 38 min    Activity Tolerance Patient tolerated treatment well    Behavior During Therapy [facility] for tasks assessed/performed             Past Medical History:  Diagnosis Date   Chest pain 07/09/2020   COVID-19 virus infection 11/2019   Genital warts    Health maintenance examination 04/15/2021   History of COVID-19 11/25/2019   Inflammatory arthritis 04/27/2023   Normal pregnancy 06/12/2011   SVD (spontaneous vaginal delivery) 10/12/2016   Upper respiratory tract infection 04/23/2022   Past Surgical History:  Procedure Laterality Date   COLONOSCOPY  11/2022   1cm SSP, rpt 3 yrs [address])   ESOPHAGOGASTRODUODENOSCOPY  11/2022   small HH, normal esophagus, biopsy with active inflammation and metaplasia but not endoscopically consistent with Barrett [address])   HYSTEROSCOPY     TONSILLECTOMY AND ADENOIDECTOMY     WISDOM TOOTH EXTRACTION     Patient Active Problem List   Diagnosis Date Noted   Blurry vision, right eye 04/27/2023   Inflammatory arthritis 04/27/2023   Vertigo 12/14/2022   Allergic reaction to chemical substance 12/14/2022   Health maintenance examination 04/15/2021   Abnormal TSH 04/14/2021   Chest pain 07/09/2020   Epigastric pain 07/09/2020   Carrier of hemochromatosis HFE gene mutation 09/19/2019   Family history of hemochromatosis 09/10/2019   Headache 08/13/2019   Abnormal cervical  Papanicolaou smear 12/06/2017   Irregular periods 12/06/2017   Oligomenorrhea 12/06/2017   Overweight (BMI 25.0-29.9) 12/06/2017   Carrier of group B Streptococcus 09/07/2016    PCP: Eustaquio Boyden, MD  REFERRING PROVIDER: Antony Madura, MD (neurology)  REFERRING DIAG: neck pain  THERAPY DIAG:  Cervicalgia  Intractable headache, unspecified chronicity pattern, unspecified headache type  Rationale for Evaluation and Treatment: Rehabilitation  ONSET DATE: 2017  PERTINENT HISTORY:  Patient is a 37 y.o. female who presents to outpatient physical therapy with a referral for medical diagnosis neck pain. This patient's chief complaints consist of chronic neck pain and constant right sided headache leading to the following functional deficits: difficulty working, looking down, lifting weights/working out, moving head up and down and side to side to see environment, sleeping, checking her blind spot when driving, prolonged positions. Relevant past medical history and comorbidities include has seronegative RA (recently diagnosed, not treated); Carrier of group B Streptococcus; Abnormal cervical Papanicolaou smear; Irregular periods; Oligomenorrhea; Overweight (BMI 25.0-29.9); Headache; Family history of hemochromatosis; Carrier of hemochromatosis HFE gene mutation; Chest pain; Epigastric pain; Abnormal TSH; Health maintenance examination; Vertigo; Allergic reaction to chemical substance; Blurry vision, right eye; and Inflammatory arthritis. She has a past medical history of Chest pain (07/09/2020), COVID-19 virus infection (11/2019), History of COVID-19 (11/25/2019), Inflammatory arthritis (04/27/2023), Normal pregnancy (06/12/2011), SVD (spontaneous vaginal delivery) (10/12/2016). She has a past surgical history that  includes Wisdom tooth extraction; Tonsillectomy and adenoidectomy; Hysteroscopy; Colonoscopy (11/2022); and Esophagogastroduodenoscopy (11/2022). Patient denies hx of cancer, stroke,  seizures, lung problems, heart problems, diabetes, unexplained weight loss, unexplained changes in bowel or bladder problems, unexplained stumbling or dropping things, osteoporosis, and spinal surgery.   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 She states the exercises she was originally prescribed at initial eval were good but she has not been doing them much because they are hard to do at work. She felt more sore the next day after she was doing that, but the pain she was normally having would feel better. She states she mostly does them at night. She liked the neck stretches provided by PT last two visits and noticed they were really tight feeling today after working. Patient feels like her neck is a little better since she started PT. She feels like her neck is a little bit stronger, whereas before she felt like she could not hold her head up.   PAIN:  Are you having pain? Yes NPRS: Current: 6/10 in the middle of her neck. Headache 5/10.    PATIENT GOALS: "hoping it will help my headache" "to be able to not have all this tension"  NEXT MD VISIT: not yet scheduled.   OBJECTIVE   TODAY'S TREATMENT:   Therapeutic exercise: to centralize symptoms and improve ROM, strength, muscular endurance, and activity tolerance required for successful completion of functional activities.   Seated with lumbar spine supported by towel roll:  - repeated cervical retraction with self over pressure (OP), 2x10 (Manual therapy  in seated - see below).  - repeated cervical retraction to extension, 1x10  - repeated cervical retraction to extension with rotation, 1x10  - Cervical spine AROM in sagittal  and horizontal planes to check for pre/post changes in response to repeated motions (improving with each until pain minimal with rotation at right cervical spine).   - education on use of repeated motions as a priority intervention due to good response. Education on HEP including handout.   Manual therapy: to reduce pain and tissue tension, improve range of motion, neuromodulation, in order to promote improved ability to complete functional activities. SEATED - cervical retraction with clinician OP, 3x6  SUPINE - STM with sustained pressure to right suboccipital muscles.   Patient required multimodal cuing for proper technique and to facilitate improved neuromuscular control, strength, range of motion, and functional ability resulting in improved performance and form.  PATIENT EDUCATION:  Education details: Exercise purpose/form. Self management techniques. Education on diagnosis, prognosis, POC, anatomy and physiology of current condition. Education on HEP including handout. Person educated: Patient Education method: Explanation, Demonstration, Tactile cues, Verbal cues, and Handouts Education comprehension: verbalized understanding, returned demonstration, and needs further education  HOME EXERCISE PROGRAM: HOME EXERCISE PROGRAM [[id number]] View at [email or web address]-exercise-[email or web address] using code: [email or web address] Cervical Retraction in Sitting w/OP - MDT -  Repeat 10 Repetitions, Hold 1 Second(s), Complete 2 Sets, Perform 3 Times a Day  Cervical Retraction/Extension in Sitting - MDT -  Repeat 10 Repetitions, Hold 1 Second(s), Complete 2 Sets, Perform 3 Times a Day  Access Code: [email or web address] URL: [email or web address] Date: 07/03/2023 Prepared by: Norton Blizzard  Exercises - Seated Posture with Lumbar Roll   Access Code: [email or web address] URL: [email or web address] Date: 07/11/2023 Prepared by: Maylon Peppers  Exercises - Standing Shoulder Horizontal Abduction with Resistance  - 2-3 x  daily - 5-7 x weekly - 3 sets - 10 reps - Standing Shoulder Diagonal Horizontal Abduction 60/120 Degrees with Resistance  - 2-3 x daily - 5-7 x weekly - 3 sets - 10 reps    ASSESSMENT:  CLINICAL IMPRESSION:  Patient arrives with increased neck pain and headache after spending a lot of time bending over a machine at work. She had an excellent response to repeated motion exercises and manual therapy for extension preference in the cervical spine with minimal pain reported in the neck by end of session. Patient was advised to re-focus HEP on specific exercise for directional preference at least until next PT session. Patient continues to have strong reproduction of headache pain with pressure to the right suboccipital region. She may benefit from dry needling to this region and/or upper cervical spine flexion mobilization to help with headaches. Patient would benefit from continued management of limiting condition by skilled physical therapist to address remaining impairments and functional limitations to work towards stated goals and return to PLOF or maximal functional independence.   OBJECTIVE IMPAIRMENTS: decreased activity tolerance, decreased endurance, decreased knowledge of condition, decreased ROM, decreased strength, hypomobility, impaired perceived functional ability, increased muscle spasms, impaired flexibility, improper body mechanics, postural dysfunction, and pain.   ACTIVITY LIMITATIONS: sitting and sleeping  PARTICIPATION LIMITATIONS: interpersonal relationship, driving, community activity, occupation, and   difficulty working, looking down, lifting weights/working out, moving head up and down and side to side to see environment, sleeping, checking her blind spot when driving, prolonged positions  PERSONAL FACTORS: Past/current experiences, Profession, Time since onset of injury/illness/exacerbation, and 3+ comorbidities:   has seronegative RA (recently diagnosed, not treated); Carrier  of group B Streptococcus; Abnormal cervical Papanicolaou smear; Irregular periods; Oligomenorrhea; Overweight (BMI 25.0-29.9); Headache; Family history of hemochromatosis; Carrier of hemochromatosis HFE gene mutation; Chest pain; Epigastric pain; Abnormal TSH; Health maintenance examination; Vertigo; Allergic reaction to chemical substance; Blurry vision, right eye; and Inflammatory arthritis. She has a past medical history of Chest pain (07/09/2020), COVID-19 virus infection (11/2019), History of COVID-19 (11/25/2019), Inflammatory arthritis (04/27/2023), Normal pregnancy (06/12/2011), SVD (spontaneous vaginal delivery) (10/12/2016). She has a past surgical history that includes Wisdom tooth extraction; Tonsillectomy and adenoidectomy; Hysteroscopy; Colonoscopy (11/2022); and Esophagogastroduodenoscopy (11/2022) are also affecting patient's functional outcome.   REHAB POTENTIAL: Good  CLINICAL DECISION MAKING: Evolving/moderate complexity  EVALUATION COMPLEXITY: Moderate  GOALS: Goals reviewed with patient? No  SHORT TERM GOALS: Target date: 07/17/2023  Patient will be independent with initial home exercise program for self-management of symptoms. Baseline: Initial HEP provided at IE (07/03/23); Goal status: In-progress   LONG TERM GOALS: Target date: 09/25/2023  Patient will be independent with a long-term home exercise program for self-management of symptoms.  Baseline: Initial HEP provided at IE (07/03/23); Goal status: In-progress  2.  Patient will demonstrate improved FOTO to equal or greater than 66 by visit #10 to demonstrate improvement in overall condition and self-reported functional ability.  Baseline: 54 (07/03/23); Goal status: In-progress  3.  Patient will report equal or less than 2 headache days per week to improve her ability to participate in her work and leisure activities with less difficulty.  Baseline: headache constant (07/03/23); Goal status: In-progress  4.   Patient will demonstrate full cervical spine AROM without increased pain except temporary end range discomfort to improve her ability to check her blind spot and view her surroundings.  Baseline: painful and limited - see objective (07/03/23); Goal status: In-progress  5.  Patient will complete community, work and/or recreational activities with 75% less limitation due to current condition.  Baseline: difficulty working, looking down, lifting weights/working out, moving head up and down and side to side to see environment, sleeping, checking her blind spot when driving, prolonged positions (07/03/23); Goal status: In-progress  6.  Patient will report pain equal or less to 3/10 with functional activities to improve her ability to complete work tasks, sleep, and work out.  Baseline: up to 7/10 (07/03/23):  Goal status: In-progress    PLAN:  PT FREQUENCY: 1-2x/week  PT DURATION: 12 weeks  PLANNED INTERVENTIONS: Therapeutic exercises, Therapeutic activity, Neuromuscular re-education, Patient/Family education, Self Care, Joint mobilization, Dry Needling, Electrical stimulation, Spinal mobilization, Cryotherapy, Moist heat, Manual therapy, and Re-evaluation  PLAN FOR NEXT SESSION: update HEP as appropriate, cervical retraction, upper cervical spine flexion based exercises, postural strengthening, manual therapy, education. Progressive functional/postural strengthening.    Cira Rue, PT, DPT 07/16/2023, 7:36 PM  [facility] [address] [place], [place] [address] P: [phone] I F: [phone]

## 2023-07-18 ENCOUNTER — Ambulatory Visit: Payer: BC Managed Care – PPO | Admitting: Physical Therapy

## 2023-07-18 ENCOUNTER — Encounter: Payer: Self-pay | Admitting: Physical Therapy

## 2023-07-18 DIAGNOSIS — R519 Headache, unspecified: Secondary | ICD-10-CM

## 2023-07-18 DIAGNOSIS — M542 Cervicalgia: Secondary | ICD-10-CM

## 2023-07-18 NOTE — Therapy (Signed)
OUTPATIENT PHYSICAL THERAPY TREATMENT   Patient Name: Martha Coleman MRN: [id number] DOB:1986-06-18, 37 y.o., female Today's Date: 07/18/2023  END OF SESSION:  PT End of Session - 07/18/23 1802     Visit Number 5    Number of Visits 13    Date for PT Re-Evaluation 09/25/23    Authorization Type [employer] COMM PPO reporting period from 07/03/2023    Authorization Time Period VL: 30 PT/OT/Chiro    Authorization - Visit Number 5    Authorization - Number of Visits 30    Progress Note Due on Visit 10    PT Start Time 1735    PT Stop Time 1818    PT Time Calculation (min) 43 min    Activity Tolerance Patient tolerated treatment well    Behavior During Therapy [facility] for tasks assessed/performed              Past Medical History:  Diagnosis Date   Chest pain 07/09/2020   COVID-19 virus infection 11/2019   Genital warts    Health maintenance examination 04/15/2021   History of COVID-19 11/25/2019   Inflammatory arthritis 04/27/2023   Normal pregnancy 06/12/2011   SVD (spontaneous vaginal delivery) 10/12/2016   Upper respiratory tract infection 04/23/2022   Past Surgical History:  Procedure Laterality Date   COLONOSCOPY  11/2022   1cm SSP, rpt 3 yrs [address])   ESOPHAGOGASTRODUODENOSCOPY  11/2022   small HH, normal esophagus, biopsy with active inflammation and metaplasia but not endoscopically consistent with Barrett [address])   HYSTEROSCOPY     TONSILLECTOMY AND ADENOIDECTOMY     WISDOM TOOTH EXTRACTION     Patient Active Problem List   Diagnosis Date Noted   Blurry vision, right eye 04/27/2023   Inflammatory arthritis 04/27/2023   Vertigo 12/14/2022   Allergic reaction to chemical substance 12/14/2022   Health maintenance examination 04/15/2021   Abnormal TSH 04/14/2021   Chest pain 07/09/2020   Epigastric pain 07/09/2020   Carrier of hemochromatosis HFE gene mutation 09/19/2019   Family history of hemochromatosis 09/10/2019   Headache 08/13/2019   Abnormal cervical  Papanicolaou smear 12/06/2017   Irregular periods 12/06/2017   Oligomenorrhea 12/06/2017   Overweight (BMI 25.0-29.9) 12/06/2017   Carrier of group B Streptococcus 09/07/2016    PCP: Eustaquio Boyden, MD  REFERRING PROVIDER: Antony Madura, MD (neurology)  REFERRING DIAG: neck pain  THERAPY DIAG:  Cervicalgia  Intractable headache, unspecified chronicity pattern, unspecified headache type  Rationale for Evaluation and Treatment: Rehabilitation  ONSET DATE: 2017  PERTINENT HISTORY:  Patient is a 37 y.o. female who presents to outpatient physical therapy with a referral for medical diagnosis neck pain. This patient's chief complaints consist of chronic neck pain and constant right sided headache leading to the following functional deficits: difficulty working, looking down, lifting weights/working out, moving head up and down and side to side to see environment, sleeping, checking her blind spot when driving, prolonged positions. Relevant past medical history and comorbidities include has seronegative RA (recently diagnosed, not treated); Carrier of group B Streptococcus; Abnormal cervical Papanicolaou smear; Irregular periods; Oligomenorrhea; Overweight (BMI 25.0-29.9); Headache; Family history of hemochromatosis; Carrier of hemochromatosis HFE gene mutation; Chest pain; Epigastric pain; Abnormal TSH; Health maintenance examination; Vertigo; Allergic reaction to chemical substance; Blurry vision, right eye; and Inflammatory arthritis. She has a past medical history of Chest pain (07/09/2020), COVID-19 virus infection (11/2019), History of COVID-19 (11/25/2019), Inflammatory arthritis (04/27/2023), Normal pregnancy (06/12/2011), SVD (spontaneous vaginal delivery) (10/12/2016). She has a past surgical history  that includes Wisdom tooth extraction; Tonsillectomy and adenoidectomy; Hysteroscopy; Colonoscopy (11/2022); and Esophagogastroduodenoscopy (11/2022). Patient denies hx of cancer, stroke,  seizures, lung problems, heart problems, diabetes, unexplained weight loss, unexplained changes in bowel or bladder problems, unexplained stumbling or dropping things, osteoporosis, and spinal surgery.   SUBJECTIVE:                                                                                                                                                                                                         SUBJECTIVE STATEMENT: Patient states she has been doing cervical retraction with overpressure and retraction to extension with rotation. She states her lower neck and upper thoracic spine are pretty sore from the exercise but her neck pain does not seem as bad. Her headache was worse yesterday but better today.   PAIN:  Are you having pain? Yes NPRS: Current: 6/10 soreness in her upper thoracic and lower cervical spine. Headache 5/10.    PATIENT GOALS: "hoping it will help my headache" "to be able to not have all this tension"  NEXT MD VISIT: not yet scheduled.   OBJECTIVE   TODAY'S TREATMENT:   Therapeutic exercise: to centralize symptoms and improve ROM, strength, muscular endurance, and activity tolerance required for successful completion of functional activities.  - seated AROM cervical spine rotation (improved compared to start of last PT session).  Seated with lumbar spine supported by towel roll:  - repeated cervical retraction to extension with rotation, 2x10  (Manual therapy / dry needling - see below).  - prone cervical retraction AROM, 3x10 - seated upper cervical spine flexion stretch, 3x30 seconds - seated Neck Rotation and Nod- capital, atlanto occipital mobilization- 1x20 each direction.  - seated Suboccipital Stretch and C1-C2 Mobilization, 1x20 each side.  - Education on HEP including handout   Manual therapy: to reduce pain and tissue tension, improve range of motion, neuromodulation, in order to promote improved ability to complete functional  activities. SUPINE - right posterior upper cervical spine mobilization, 2x10 grade III  PRONE - STM to bilateral suboccipital muscles  Modality: Dry needling performed to bilateral suboccipital region to decrease pain and spasms along patient's neck and head region with patient in prone utilizing 2 dry needle(s) .30mm x 30mm with 1 stick at each side at suboccipitals . Patient educated about the risks and benefits from therapy and verbally consents to treatment.  Dry needling performed by Luretha Murphy. [name] PT, DPT who is certified in this technique.   Patient required multimodal cuing for proper technique and to facilitate improved neuromuscular control, strength,  range of motion, and functional ability resulting in improved performance and form.  PATIENT EDUCATION:  Education details: Exercise purpose/form. Self management techniques. Education on diagnosis, prognosis, POC, anatomy and physiology of current condition. Education on HEP including handout. Person educated: Patient Education method: Explanation, Demonstration, Tactile cues, Verbal cues, and Handouts Education comprehension: verbalized understanding, returned demonstration, and needs further education  HOME EXERCISE PROGRAM: HOME EXERCISE PROGRAM [[id number]] View at [email or web address] using code: [email or web address] Cervical Retraction in Sitting w/OP - MDT -  Repeat 10 Repetitions, Hold 1 Second(s), Complete 2 Sets, Perform 3 Times a Day  Cervical Retraction/Extension in Sitting - MDT -  Repeat 10 Repetitions, Hold 1 Second(s), Complete 2 Sets, Perform 3 Times a Day  HOME EXERCISE PROGRAM [[email or web address]] View at [email or web address] using code: [email or web address] Neck Rotation and Nod- capital, atlanto occipital mobilization  -  Repeat 20 Repetitions, Hold 1 Second(s), Complete 1 Set, Perform 3 Times a Day  Suboccipital Stretch and C1-C2 Mobilization -  Repeat 3 Repetitions, Hold 30 Seconds, Perform 3 Times a Day  Access Code: [email or web address] URL:  [email or web address] Date: 07/03/2023 Prepared by: Norton Blizzard  Exercises - Seated Posture with Lumbar Roll   Access Code: [email or web address] URL: [email or web address] Date: 07/11/2023 Prepared by: Maylon Peppers  Exercises - Standing Shoulder Horizontal Abduction with Resistance  - 2-3 x daily - 5-7 x weekly - 3 sets - 10 reps - Standing Shoulder Diagonal Horizontal Abduction 60/120 Degrees with Resistance  - 2-3 x daily - 5-7 x weekly - 3 sets - 10 reps    ASSESSMENT:  CLINICAL IMPRESSION:  Patient arrives with non-concordant soreness and improved cervical spine rotation. She underwent dry needling today with report of improved pain directly after. Strengthening and stretching exercises and manual therapy were combined with dry needling to encourage improved extensibility and function of suboccipital muscles and improve neck mechanics. Consider sustained stretch for suboccipital and C1-2 exercise, suboccipital contract-relax technique, and continued strengthening for the deep neck flexors. Patient would benefit from continued management of limiting condition by skilled physical therapist to address remaining impairments and functional limitations to work towards stated goals and return to PLOF or maximal functional independence.   OBJECTIVE IMPAIRMENTS: decreased activity tolerance, decreased endurance, decreased knowledge of condition, decreased ROM, decreased strength, hypomobility, impaired perceived functional ability, increased muscle spasms, impaired flexibility, improper body mechanics, postural dysfunction, and pain.   ACTIVITY LIMITATIONS: sitting and sleeping  PARTICIPATION LIMITATIONS: interpersonal relationship, driving, community activity, occupation, and   difficulty working, looking down, lifting weights/working out, moving head up and down and side to side to see environment, sleeping, checking her blind spot when driving, prolonged  positions  PERSONAL FACTORS: Past/current experiences, Profession, Time since onset of injury/illness/exacerbation, and 3+ comorbidities:   has seronegative RA (recently diagnosed, not treated); Carrier of group B Streptococcus; Abnormal cervical Papanicolaou smear; Irregular periods; Oligomenorrhea; Overweight (BMI 25.0-29.9); Headache; Family history of hemochromatosis; Carrier of hemochromatosis HFE gene mutation; Chest pain; Epigastric pain; Abnormal TSH; Health maintenance examination; Vertigo; Allergic reaction to chemical substance; Blurry vision, right eye; and Inflammatory arthritis. She has a past medical history of Chest pain (07/09/2020), COVID-19 virus infection (11/2019), History of COVID-19 (11/25/2019), Inflammatory arthritis (04/27/2023), Normal pregnancy (06/12/2011), SVD (spontaneous vaginal delivery) (10/12/2016). She has a past surgical history that includes Wisdom tooth extraction; Tonsillectomy and adenoidectomy; Hysteroscopy; Colonoscopy (11/2022); and Esophagogastroduodenoscopy (11/2022) are also affecting patient's functional outcome.   REHAB POTENTIAL: Good  CLINICAL DECISION MAKING: Evolving/moderate complexity  EVALUATION COMPLEXITY: Moderate   GOALS: Goals reviewed with patient? No  SHORT  TERM GOALS: Target date: 07/17/2023  Patient will be independent with initial home exercise program for self-management of symptoms. Baseline: Initial HEP provided at IE (07/03/23); Goal status: In-progress   LONG TERM GOALS: Target date: 09/25/2023  Patient will be independent with a long-term home exercise program for self-management of symptoms.  Baseline: Initial HEP provided at IE (07/03/23); Goal status: In-progress  2.  Patient will demonstrate improved FOTO to equal or greater than 66 by visit #10 to demonstrate improvement in overall condition and self-reported functional ability.  Baseline: 54 (07/03/23); Goal status: In-progress  3.  Patient will report equal or  less than 2 headache days per week to improve her ability to participate in her work and leisure activities with less difficulty.  Baseline: headache constant (07/03/23); Goal status: In-progress  4.  Patient will demonstrate full cervical spine AROM without increased pain except temporary end range discomfort to improve her ability to check her blind spot and view her surroundings.  Baseline: painful and limited - see objective (07/03/23); Goal status: In-progress  5.  Patient will complete community, work and/or recreational activities with 75% less limitation due to current condition.  Baseline: difficulty working, looking down, lifting weights/working out, moving head up and down and side to side to see environment, sleeping, checking her blind spot when driving, prolonged positions (07/03/23); Goal status: In-progress  6.  Patient will report pain equal or less to 3/10 with functional activities to improve her ability to complete work tasks, sleep, and work out.  Baseline: up to 7/10 (07/03/23):  Goal status: In-progress    PLAN:  PT FREQUENCY: 1-2x/week  PT DURATION: 12 weeks  PLANNED INTERVENTIONS: Therapeutic exercises, Therapeutic activity, Neuromuscular re-education, Patient/Family education, Self Care, Joint mobilization, Dry Needling, Electrical stimulation, Spinal mobilization, Cryotherapy, Moist heat, Manual therapy, and Re-evaluation  PLAN FOR NEXT SESSION: update HEP as appropriate, cervical retraction, upper cervical spine flexion based exercises, postural strengthening, manual therapy, education. Progressive functional/postural strengthening.    Cira Rue, PT, DPT 07/18/2023, 6:34 PM  [facility] [address] [place], [place] [address] P: [phone] I F: [phone]

## 2023-07-23 ENCOUNTER — Encounter: Payer: BC Managed Care – PPO | Admitting: Physical Therapy

## 2023-07-24 ENCOUNTER — Ambulatory Visit: Payer: BC Managed Care – PPO | Admitting: Physical Therapy

## 2023-07-25 ENCOUNTER — Ambulatory Visit: Payer: BC Managed Care – PPO | Admitting: Physical Therapy

## 2023-07-25 ENCOUNTER — Encounter: Payer: BC Managed Care – PPO | Admitting: Physical Therapy

## 2023-07-31 ENCOUNTER — Encounter: Payer: BC Managed Care – PPO | Admitting: Physical Therapy

## 2023-07-31 ENCOUNTER — Ambulatory Visit: Payer: BC Managed Care – PPO | Admitting: Physical Therapy

## 2023-07-31 ENCOUNTER — Encounter: Payer: Self-pay | Admitting: Physical Therapy

## 2023-07-31 DIAGNOSIS — R519 Headache, unspecified: Secondary | ICD-10-CM | POA: Diagnosis not present

## 2023-07-31 DIAGNOSIS — M542 Cervicalgia: Secondary | ICD-10-CM | POA: Diagnosis not present

## 2023-08-02 ENCOUNTER — Ambulatory Visit: Payer: BC Managed Care – PPO | Admitting: Family Medicine

## 2023-08-06 ENCOUNTER — Encounter: Payer: BC Managed Care – PPO | Admitting: Physical Therapy

## 2023-08-08 ENCOUNTER — Ambulatory Visit: Payer: BC Managed Care – PPO | Attending: Neurology | Admitting: Physical Therapy

## 2023-08-08 ENCOUNTER — Encounter: Payer: BC Managed Care – PPO | Admitting: Physical Therapy

## 2023-08-08 DIAGNOSIS — R519 Headache, unspecified: Secondary | ICD-10-CM | POA: Diagnosis not present

## 2023-08-08 DIAGNOSIS — M542 Cervicalgia: Secondary | ICD-10-CM | POA: Insufficient documentation

## 2023-08-08 NOTE — Therapy (Signed)
OUTPATIENT PHYSICAL THERAPY TREATMENT   Patient Name: SUMMIT BORCHARDT MRN: [id number] DOB:04/12/1986, 37 y.o., female Today's Date: 08/08/2023  END OF SESSION:  PT End of Session - 08/08/23 1730     Visit Number 7    Number of Visits 13    Date for PT Re-Evaluation 09/25/23    Authorization Type [employer] COMM PPO reporting period from 07/03/2023    Authorization Time Period VL: 30 PT/OT/Chiro    Authorization - Visit Number 7    Authorization - Number of Visits 30    Progress Note Due on Visit 10    PT Start Time 1730    PT Stop Time 1820    PT Time Calculation (min) 50 min    Activity Tolerance Patient tolerated treatment well    Behavior During Therapy [facility] for tasks assessed/performed                Past Medical History:  Diagnosis Date   Chest pain 07/09/2020   COVID-19 virus infection 11/2019   Genital warts    Health maintenance examination 04/15/2021   History of COVID-19 11/25/2019   Inflammatory arthritis 04/27/2023   Normal pregnancy 06/12/2011   SVD (spontaneous vaginal delivery) 10/12/2016   Upper respiratory tract infection 04/23/2022   Past Surgical History:  Procedure Laterality Date   COLONOSCOPY  11/2022   1cm SSP, rpt 3 yrs [address])   ESOPHAGOGASTRODUODENOSCOPY  11/2022   small HH, normal esophagus, biopsy with active inflammation and metaplasia but not endoscopically consistent with Barrett [address])   HYSTEROSCOPY     TONSILLECTOMY AND ADENOIDECTOMY     WISDOM TOOTH EXTRACTION     Patient Active Problem List   Diagnosis Date Noted   Blurry vision, right eye 04/27/2023   Inflammatory arthritis 04/27/2023   Vertigo 12/14/2022   Allergic reaction to chemical substance 12/14/2022   Health maintenance examination 04/15/2021   Abnormal TSH 04/14/2021   Chest pain 07/09/2020   Epigastric pain 07/09/2020   Carrier of hemochromatosis HFE gene mutation 09/19/2019   Family history of hemochromatosis 09/10/2019   Headache 08/13/2019   Abnormal cervical  Papanicolaou smear 12/06/2017   Irregular periods 12/06/2017   Oligomenorrhea 12/06/2017   Overweight (BMI 25.0-29.9) 12/06/2017   Carrier of group B Streptococcus 09/07/2016    PCP: Eustaquio Boyden, MD  REFERRING PROVIDER: Antony Madura, MD (neurology)  REFERRING DIAG: neck pain  THERAPY DIAG:  Cervicalgia  Intractable headache, unspecified chronicity pattern, unspecified headache type  Rationale for Evaluation and Treatment: Rehabilitation  ONSET DATE: 2017  PERTINENT HISTORY:  Patient is a 37 y.o. female who presents to outpatient physical therapy with a referral for medical diagnosis neck pain. This patient's chief complaints consist of chronic neck pain and constant right sided headache leading to the following functional deficits: difficulty working, looking down, lifting weights/working out, moving head up and down and side to side to see environment, sleeping, checking her blind spot when driving, prolonged positions. Relevant past medical history and comorbidities include has seronegative RA (recently diagnosed, not treated); Carrier of group B Streptococcus; Abnormal cervical Papanicolaou smear; Irregular periods; Oligomenorrhea; Overweight (BMI 25.0-29.9); Headache; Family history of hemochromatosis; Carrier of hemochromatosis HFE gene mutation; Chest pain; Epigastric pain; Abnormal TSH; Health maintenance examination; Vertigo; Allergic reaction to chemical substance; Blurry vision, right eye; and Inflammatory arthritis. She has a past medical history of Chest pain (07/09/2020), COVID-19 virus infection (11/2019), History of COVID-19 (11/25/2019), Inflammatory arthritis (04/27/2023), Normal pregnancy (06/12/2011), SVD (spontaneous vaginal delivery) (10/12/2016). She has a past  surgical history that includes Wisdom tooth extraction; Tonsillectomy and adenoidectomy; Hysteroscopy; Colonoscopy (11/2022); and Esophagogastroduodenoscopy (11/2022). Patient denies hx of cancer, stroke,  seizures, lung problems, heart problems, diabetes, unexplained weight loss, unexplained changes in bowel or bladder problems, unexplained stumbling or dropping things, osteoporosis, and spinal surgery.   SUBJECTIVE:                                                                                                                                                                                                         SUBJECTIVE STATEMENT: Patient states her neck has been better. She states her neck has been sore, and she thinks it is from doing all the exercises, but she has not had the concordant pain she came to PT for. Her headaches have been better. She thinks the dry needling helped. She has had a couple of days where she had a headache but not like before. She states her head and neck is doing pretty well today.  She does not have any questions about her HEP. She last remembers pain maybe two days ago, and it was mild.   PAIN:  NPRS: Current: 0/10   PATIENT GOALS: "hoping it will help my headache" "to be able to not have all this tension"  NEXT MD VISIT: not yet scheduled.   OBJECTIVE  TODAY'S TREATMENT:   Therapeutic exercise: to centralize symptoms and improve ROM, strength, muscular endurance, and activity tolerance required for successful completion of functional activities.  - seated AROM cervical spine rotation to check response to interventions (improved after dry needling/manual) - seated UT stretch with self overpressure, 3x30 seconds each side.  - prone cervical spine retraction 1x5, with "W" scapular retraction 1x5, with "W" to "Y" with scapular retractoin, 1x5.  - Education on HEP including handout   Manual therapy: to reduce pain and tissue tension, improve range of motion, neuromodulation, in order to promote improved ability to complete functional activities. PRONE - STM to bilateral posterior cervical spine musculature, L > R  Modality: Dry needling performed to  posterior neck region to decrease pain and spasms along patient's neck and head region with patient in prone utilizing 2/1 dry needle(s) .25mm x 30/30/40mm with 2 sticks each at left and right suboccipitals, 1 stick each side at mid cervical multifidi, and 1 sick each side at upper traps. Patient educated about the risks and benefits from therapy and verbally consents to treatment.  Dry needling performed by Luretha Murphy. [name] PT, DPT who is certified in this technique.  Patient required multimodal cuing for proper technique and to  facilitate improved neuromuscular control, strength, range of motion, and functional ability resulting in improved performance and form.  PATIENT EDUCATION:  Education details: Exercise purpose/form. Self management techniques. Education on diagnosis, prognosis, POC, anatomy and physiology of current condition. Education on HEP including handout. Person educated: Patient Education method: Explanation, Demonstration, Tactile cues, Verbal cues, and Handouts Education comprehension: verbalized understanding, returned demonstration, and needs further education  HOME EXERCISE PROGRAM: HOME EXERCISE PROGRAM [[id number]] View at [email or web address]-[email or web address] using code: [email or web address] Cervical Retraction in Sitting w/OP - MDT -  Repeat 10 Repetitions, Hold 1 Second(s), Complete 2 Sets, Perform 3 Times a Day  Cervical Retraction/Extension in Sitting - MDT -  Repeat 10 Repetitions, Hold 1 Second(s), Complete 2 Sets, Perform 3 Times a Day  HOME EXERCISE PROGRAM [[email or web address]] View at [email or web address] using code: [email or web address] Neck Rotation and Nod- capital, atlanto occipital mobilization  -  Repeat 20 Repetitions, Hold 1 Second(s), Complete 1 Set, Perform 3 Times a Day  Suboccipital Stretch and C1-C2 Mobilization -  Repeat 3 Repetitions, Hold 30 Seconds, Perform 3 Times a Day  Access Code: [email or web address] URL: [email or web address] Date: 07/03/2023 Prepared by: Norton Blizzard  Exercises - Seated Posture with Lumbar Roll   Access Code: [email or web address] URL: [email or web address] Date: 08/08/2023 Prepared by: Norton Blizzard  Exercises - Supine Deep Neck Flexor Training - Hold  - 3-5 x weekly - 5 sets - 30 seconds hold - Prone W Scapular Retraction  - 3-5 x weekly - 3 sets - 10 reps    ASSESSMENT:  CLINICAL IMPRESSION:  Patient arrives reporting good improvement in symptom since last session but not fully resolved. She continues to have some left sided neck and headache, especially with end range right rotation of cervical spine. This improved following manual therapy, dry needling, and UT stretching. She had reproduction of left sided headache symptoms with dry needling and STM to left upper trap region, which may benefit from further treatment next session. HEP was updated to include more postural and neck strengthening for longer term relief of symptoms. Patient tolerated treatment well overall and reported feeling better by end of session with improved ease of movement. Patient would benefit from continued management of limiting condition by skilled physical therapist to address remaining impairments and functional limitations to work towards stated goals and return to PLOF or maximal functional independence.   OBJECTIVE IMPAIRMENTS: decreased activity tolerance, decreased endurance, decreased knowledge of condition, decreased ROM, decreased strength, hypomobility, impaired perceived functional ability, increased muscle spasms, impaired flexibility, improper body mechanics, postural dysfunction, and pain.   ACTIVITY LIMITATIONS: sitting and sleeping  PARTICIPATION LIMITATIONS: interpersonal relationship, driving, community activity, occupation, and   difficulty working, looking down, lifting weights/working out, moving head up and down and side to side to see environment, sleeping, checking her blind spot when driving, prolonged positions  PERSONAL  FACTORS: Past/current experiences, Profession, Time since onset of injury/illness/exacerbation, and 3+ comorbidities:   has seronegative RA (recently diagnosed, not treated); Carrier of group B Streptococcus; Abnormal cervical Papanicolaou smear; Irregular periods; Oligomenorrhea; Overweight (BMI 25.0-29.9); Headache; Family history of hemochromatosis; Carrier of hemochromatosis HFE gene mutation; Chest pain; Epigastric pain; Abnormal TSH; Health maintenance examination; Vertigo; Allergic reaction to chemical substance; Blurry vision, right eye; and Inflammatory arthritis. She has a past medical history of Chest pain (07/09/2020), COVID-19 virus infection (11/2019), History of COVID-19 (11/25/2019), Inflammatory arthritis (04/27/2023), Normal pregnancy (06/12/2011), SVD (spontaneous vaginal delivery) (10/12/2016). She has a past surgical history that includes Wisdom tooth extraction; Tonsillectomy and adenoidectomy; Hysteroscopy; Colonoscopy (11/2022); and Esophagogastroduodenoscopy (11/2022)  are also affecting patient's functional outcome.   REHAB POTENTIAL: Good  CLINICAL DECISION MAKING: Evolving/moderate complexity  EVALUATION COMPLEXITY: Moderate   GOALS: Goals reviewed with patient? No  SHORT TERM GOALS: Target date: 07/17/2023  Patient will be independent with initial home exercise program for self-management of symptoms. Baseline: Initial HEP provided at IE (07/03/23); Goal status: In-progress   LONG TERM GOALS: Target date: 09/25/2023  Patient will be independent with a long-term home exercise program for self-management of symptoms.  Baseline: Initial HEP provided at IE (07/03/23); Goal status: In-progress  2.  Patient will demonstrate improved FOTO to equal or greater than 66 by visit #10 to demonstrate improvement in overall condition and self-reported functional ability.  Baseline: 54 (07/03/23); Goal status: In-progress  3.  Patient will report equal or less than 2 headache  days per week to improve her ability to participate in her work and leisure activities with less difficulty.  Baseline: headache constant (07/03/23); Goal status: In-progress  4.  Patient will demonstrate full cervical spine AROM without increased pain except temporary end range discomfort to improve her ability to check her blind spot and view her surroundings.  Baseline: painful and limited - see objective (07/03/23); Goal status: In-progress  5.  Patient will complete community, work and/or recreational activities with 75% less limitation due to current condition.  Baseline: difficulty working, looking down, lifting weights/working out, moving head up and down and side to side to see environment, sleeping, checking her blind spot when driving, prolonged positions (07/03/23); Goal status: In-progress  6.  Patient will report pain equal or less to 3/10 with functional activities to improve her ability to complete work tasks, sleep, and work out.  Baseline: up to 7/10 (07/03/23):  Goal status: In-progress    PLAN:  PT FREQUENCY: 1-2x/week  PT DURATION: 12 weeks  PLANNED INTERVENTIONS: Therapeutic exercises, Therapeutic activity, Neuromuscular re-education, Patient/Family education, Self Care, Joint mobilization, Dry Needling, Electrical stimulation, Spinal mobilization, Cryotherapy, Moist heat, Manual therapy, and Re-evaluation  PLAN FOR NEXT SESSION: update HEP as appropriate, cervical retraction, upper cervical spine flexion based exercises, postural strengthening, manual therapy, education. Progressive functional/postural strengthening.    Cira Rue, PT, DPT 08/08/2023, 6:30 PM  [facility] [address] [place], [place] [address] P: [phone] I F: [phone]

## 2023-08-12 ENCOUNTER — Encounter: Payer: BC Managed Care – PPO | Admitting: Physical Therapy

## 2023-08-13 ENCOUNTER — Ambulatory Visit: Payer: BC Managed Care – PPO | Admitting: Physical Therapy

## 2023-08-14 ENCOUNTER — Encounter: Payer: BC Managed Care – PPO | Admitting: Physical Therapy

## 2023-08-15 ENCOUNTER — Ambulatory Visit: Payer: BC Managed Care – PPO | Admitting: Physical Therapy

## 2023-08-15 ENCOUNTER — Encounter: Payer: Self-pay | Admitting: Physical Therapy

## 2023-08-15 DIAGNOSIS — R519 Headache, unspecified: Secondary | ICD-10-CM

## 2023-08-15 DIAGNOSIS — M542 Cervicalgia: Secondary | ICD-10-CM | POA: Diagnosis not present

## 2023-08-15 NOTE — Therapy (Signed)
OUTPATIENT PHYSICAL THERAPY TREATMENT   Patient Name: Martha Coleman MRN: [id number] DOB:[birth year], 37 y.o., female Today's Date: 08/15/2023  END OF SESSION:  PT End of Session - 08/15/23 1759     Visit Number 8    Number of Visits 13    Date for PT Re-Evaluation 09/25/23    Authorization Type [employer] COMM PPO reporting period from 07/03/2023    Authorization Time Period VL: 30 PT/OT/Chiro    Authorization - Visit Number 8    Authorization - Number of Visits 30    Progress Note Due on Visit 10    PT Start Time 1732    PT Stop Time 1820    PT Time Calculation (min) 48 min    Activity Tolerance Patient tolerated treatment well    Behavior During Therapy [facility] for tasks assessed/performed                 Past Medical History:  Diagnosis Date   Chest pain 07/09/2020   COVID-19 virus infection 11/2019   Genital warts    Health maintenance examination 04/15/2021   History of COVID-19 11/25/2019   Inflammatory arthritis 04/27/2023   Normal pregnancy 06/12/2011   SVD (spontaneous vaginal delivery) 10/12/2016   Upper respiratory tract infection 04/23/2022   Past Surgical History:  Procedure Laterality Date   COLONOSCOPY  11/2022   1cm SSP, rpt 3 yrs Martha Coleman)   ESOPHAGOGASTRODUODENOSCOPY  11/2022   small HH, normal esophagus, biopsy with active inflammation and metaplasia but not endoscopically consistent with Barrett Martha Coleman)   HYSTEROSCOPY     TONSILLECTOMY AND ADENOIDECTOMY     WISDOM TOOTH EXTRACTION     Patient Active Problem List   Diagnosis Date Noted   Blurry vision, right eye 04/27/2023   Inflammatory arthritis 04/27/2023   Vertigo 12/14/2022   Allergic reaction to chemical substance 12/14/2022   Health maintenance examination 04/15/2021   Abnormal TSH 04/14/2021   Chest pain 07/09/2020   Epigastric pain 07/09/2020   Carrier of hemochromatosis HFE gene mutation 09/19/2019   Family history of hemochromatosis 09/10/2019   Headache 08/13/2019   Abnormal  cervical Papanicolaou smear 12/06/2017   Irregular periods 12/06/2017   Oligomenorrhea 12/06/2017   Overweight (BMI 25.0-29.9) 12/06/2017   Carrier of group B Streptococcus 09/07/2016    PCP: Martha Boyden, MD  REFERRING PROVIDER: Antony Madura, MD (neurology)  REFERRING DIAG: neck pain  THERAPY DIAG:  Cervicalgia  Intractable headache, unspecified chronicity pattern, unspecified headache type  Rationale for Evaluation and Treatment: Rehabilitation  ONSET DATE: 2017  PERTINENT HISTORY:  Patient is a 37 y.o. female who presents to outpatient physical therapy with a referral for medical diagnosis neck pain. This patient's chief complaints consist of chronic neck pain and constant right sided headache leading to the following functional deficits: difficulty working, looking down, lifting weights/working out, moving head up and down and side to side to see environment, sleeping, checking her blind spot when driving, prolonged positions. Relevant past medical history and comorbidities include has seronegative RA (recently diagnosed, not treated); Carrier of group B Streptococcus; Abnormal cervical Papanicolaou smear; Irregular periods; Oligomenorrhea; Overweight (BMI 25.0-29.9); Headache; Family history of hemochromatosis; Carrier of hemochromatosis HFE gene mutation; Chest pain; Epigastric pain; Abnormal TSH; Health maintenance examination; Vertigo; Allergic reaction to chemical substance; Blurry vision, right eye; and Inflammatory arthritis. She has a past medical history of Chest pain (07/09/2020), COVID-19 virus infection (11/2019), History of COVID-19 (11/25/2019), Inflammatory arthritis (04/27/2023), Normal pregnancy (06/12/2011), SVD (spontaneous vaginal delivery) (10/12/2016). She has a  past surgical history that includes Wisdom tooth extraction; Tonsillectomy and adenoidectomy; Hysteroscopy; Colonoscopy (11/2022); and Esophagogastroduodenoscopy (11/2022). Patient denies hx of cancer,  stroke, seizures, lung problems, heart problems, diabetes, unexplained weight loss, unexplained changes in bowel or bladder problems, unexplained stumbling or dropping things, osteoporosis, and spinal surgery.   OBJECTIVE  SELF-REPORTED FUNCTION FOTO score: 74/100 (neck questionnaire)             SUBJECTIVE STATEMENT: Patient states it has been a good week for her neck and head. She has not had pain at all this week. She states she has been doing HEP including the strengthening exercises.  PAIN:  NPRS: Current: 0/10   PATIENT GOALS: "hoping it will help my headache" "to be able to not have all this tension"  NEXT MD VISIT: not yet scheduled.   OBJECTIVE  TODAY'S TREATMENT:   Therapeutic exercise: to centralize symptoms and improve ROM, strength, muscular endurance, and activity tolerance required for successful completion of functional activities.  - seated AROM cervical spine rotation to check response to interventions (improved after dry needling/manual) - seated UT stretch with self overpressure, 3x30 seconds each side.   - prone cervical spine retraction hold while performing "W" to "Y" with scapular retraction, 3x10 - hooklying deep neck flexor endurance training, 5x40 seconds  Circuit:  - standing B horizontal shoulder abduction with BlueTB, 3x10 - standing B diagonal shoulder horizontal abduction with BlueTB, 3x10 each direction - Education on HEP including handout   Manual therapy: to reduce pain and tissue tension, improve range of motion, neuromodulation, in order to promote improved ability to complete functional activities. PRONE - STM to bilateral posterior cervical spine musculature, L > R  Modality: Dry needling performed to posterior neck region to decrease pain and spasms along patient's neck and head region with patient in prone utilizing 1/1 dry needle(s) .25mm x 30/58mm with 2 sticks each side at upper cervical multifidi and 1 sick each side at upper traps.  Patient educated about the risks and benefits from therapy and verbally consents to treatment.  Dry needling performed by Martha Coleman. Martha Coleman PT, DPT who is certified in this technique.  Patient required multimodal cuing for proper technique and to facilitate improved neuromuscular control, strength, range of motion, and functional ability resulting in improved performance and form.  PATIENT EDUCATION:  Education details: Exercise purpose/form. Self management techniques. Education on diagnosis, prognosis, POC, anatomy and physiology of current condition. Education on HEP including handout. Person educated: Patient Education method: Explanation, Demonstration, Tactile cues, Verbal cues, and Handouts Education comprehension: verbalized understanding, returned demonstration, and needs further education  HOME EXERCISE PROGRAM: HOME EXERCISE PROGRAM [[id number]] View at [email or web address] using code: [email or web address] Cervical Retraction in Sitting w/OP - MDT -  Repeat 10 Repetitions, Hold 1 Second(s), Complete 2 Sets, Perform 3 Times a Day  Cervical Retraction/Extension in Sitting - MDT -  Repeat 10 Repetitions, Hold 1 Second(s), Complete 2 Sets, Perform 3 Times a Day  HOME EXERCISE PROGRAM [[email or web address]] View at [email or web address] using code: [email or web address] Neck Rotation and Nod- capital, atlanto occipital mobilization  -  Repeat 20 Repetitions, Hold 1 Second(s), Complete 1 Set, Perform 3 Times a Day  Suboccipital Stretch and C1-C2 Mobilization -  Repeat 3 Repetitions, Hold 30 Seconds, Perform 3 Times a Day  Access Code: [email or web address] URL: [email or web address] Date: 07/03/2023 Prepared by: Norton Blizzard  Exercises - Seated Posture with Lumbar Roll   Access Code: [email or web address] URL: [email or web address] Date: 08/15/2023 Prepared by: Norton Blizzard  Exercises - Standing Shoulder Horizontal Abduction with  Resistance  - 3-5 x weekly - 3 sets - 10 reps - Standing Shoulder Diagonal  Horizontal Abduction 60/120 Degrees with Resistance  - 3-5 x weekly - 3 sets - 10 reps - Supine Deep Neck Flexor Training - Hold  - 3-5 x weekly - 5 sets - 40 seconds hold - Prone W Scapular Retraction  - 3-5 x weekly - 3 sets - 10 reps    ASSESSMENT:  CLINICAL IMPRESSION:  Patient arrives reporting no pain since last PT session and significant improvement in condition. Today's session included dry needling focused on locations that continue to radiate towards head with palpation (right upper cervical multifidi) and other regions that could contribute, followed by exercise for postural and neck strengthening. Pateint tolerated treatment well, although she was sore following needling. Plan to work towards discharge with patient returning if needed over the next 2 weeks or more. Patient would benefit from continued management of limiting condition by skilled physical therapist to address remaining impairments and functional limitations to work towards stated goals and return to PLOF or maximal functional independence.    OBJECTIVE IMPAIRMENTS: decreased activity tolerance, decreased endurance, decreased knowledge of condition, decreased ROM, decreased strength, hypomobility, impaired perceived functional ability, increased muscle spasms, impaired flexibility, improper body mechanics, postural dysfunction, and pain.   ACTIVITY LIMITATIONS: sitting and sleeping  PARTICIPATION LIMITATIONS: interpersonal relationship, driving, community activity, occupation, and   difficulty working, looking down, lifting weights/working out, moving head up and down and side to side to see environment, sleeping, checking her blind spot when driving, prolonged positions  PERSONAL FACTORS: Past/current experiences, Profession, Time since onset of injury/illness/exacerbation, and 3+ comorbidities:   has seronegative RA (recently diagnosed, not treated); Carrier of group B Streptococcus; Abnormal cervical Papanicolaou smear;  Irregular periods; Oligomenorrhea; Overweight (BMI 25.0-29.9); Headache; Family history of hemochromatosis; Carrier of hemochromatosis HFE gene mutation; Chest pain; Epigastric pain; Abnormal TSH; Health maintenance examination; Vertigo; Allergic reaction to chemical substance; Blurry vision, right eye; and Inflammatory arthritis. She has a past medical history of Chest pain (07/09/2020), COVID-19 virus infection (11/2019), History of COVID-19 (11/25/2019), Inflammatory arthritis (04/27/2023), Normal pregnancy (06/12/2011), SVD (spontaneous vaginal delivery) (10/12/2016). She has a past surgical history that includes Wisdom tooth extraction; Tonsillectomy and adenoidectomy; Hysteroscopy; Colonoscopy (11/2022); and Esophagogastroduodenoscopy (11/2022) are also affecting patient's functional outcome.   REHAB POTENTIAL: Good  CLINICAL DECISION MAKING: Evolving/moderate complexity  EVALUATION COMPLEXITY: Moderate   GOALS: Goals reviewed with patient? No  SHORT TERM GOALS: Target date: 07/17/2023  Patient will be independent with initial home exercise program for self-management of symptoms. Baseline: Initial HEP provided at IE (07/03/23); Goal status: In-progress   LONG TERM GOALS: Target date: 09/25/2023  Patient will be independent with a long-term home exercise program for self-management of symptoms.  Baseline: Initial HEP provided at IE (07/03/23); Goal status: In-progress  2.  Patient will demonstrate improved FOTO to equal or greater than 66 by visit #10 to demonstrate improvement in overall condition and self-reported functional ability.  Baseline: 54 (07/03/23); Goal status: In-progress  3.  Patient will report equal or less than 2 headache days per week to improve her ability to participate in her work and leisure activities with less difficulty.  Baseline: headache constant (07/03/23); Goal status: In-progress  4.  Patient will demonstrate full cervical spine AROM without  increased pain except temporary end range discomfort to improve her ability to check her blind spot and view her surroundings.  Baseline: painful and limited - see objective (07/03/23); Goal status: In-progress  5.  Patient will complete community, work and/or recreational activities with 75% less limitation due to current condition.  Baseline: difficulty working, looking down, lifting weights/working out, moving head up and down and side to side to see environment, sleeping, checking her blind spot when driving, prolonged positions (07/03/23); Goal status: In-progress  6.  Patient will report pain equal or less to 3/10 with functional activities to improve her ability to complete work tasks, sleep, and work out.  Baseline: up to 7/10 (07/03/23):  Goal status: In-progress    PLAN:  PT FREQUENCY: 1-2x/week  PT DURATION: 12 weeks  PLANNED INTERVENTIONS: Therapeutic exercises, Therapeutic activity, Neuromuscular re-education, Patient/Family education, Self Care, Joint mobilization, Dry Needling, Electrical stimulation, Spinal mobilization, Cryotherapy, Moist heat, Manual therapy, and Re-evaluation  PLAN FOR NEXT SESSION: update HEP as appropriate, cervical retraction, upper cervical spine flexion based exercises, postural strengthening, manual therapy, education. Progressive functional/postural strengthening.    Cira Rue, PT, DPT 08/15/2023, 6:25 PM  [facility] [address] [place], [place] [address] P: [phone] I F: [phone]

## 2023-08-19 ENCOUNTER — Encounter: Payer: BC Managed Care – PPO | Admitting: Physical Therapy

## 2023-08-21 ENCOUNTER — Encounter: Payer: BC Managed Care – PPO | Admitting: Physical Therapy

## 2023-08-22 ENCOUNTER — Ambulatory Visit: Payer: BC Managed Care – PPO | Admitting: Physical Therapy

## 2023-08-22 ENCOUNTER — Encounter: Payer: BC Managed Care – PPO | Admitting: Physical Therapy

## 2023-08-26 ENCOUNTER — Encounter: Payer: BC Managed Care – PPO | Admitting: Physical Therapy

## 2023-08-28 ENCOUNTER — Ambulatory Visit: Payer: BC Managed Care – PPO | Admitting: Physical Therapy

## 2023-08-28 ENCOUNTER — Encounter: Payer: Self-pay | Admitting: Physical Therapy

## 2023-08-28 ENCOUNTER — Encounter: Payer: BC Managed Care – PPO | Admitting: Physical Therapy

## 2023-08-28 DIAGNOSIS — R519 Headache, unspecified: Secondary | ICD-10-CM

## 2023-08-28 DIAGNOSIS — M542 Cervicalgia: Secondary | ICD-10-CM | POA: Diagnosis not present

## 2023-08-28 NOTE — Therapy (Signed)
OUTPATIENT PHYSICAL THERAPY TREATMENT   Patient Name: Martha Coleman MRN: [id number] DOB:1986/06/10, 37 y.o., female Today's Date: 08/28/2023  END OF SESSION:  PT End of Session - 08/28/23 1935     Visit Number 9    Number of Visits 13    Date for PT Re-Evaluation 09/25/23    Authorization Type [employer] COMM PPO reporting period from 07/03/2023    Authorization Time Period VL: 30 PT/OT/Chiro    Authorization - Visit Number 9    Authorization - Number of Visits 30    Progress Note Due on Visit 10    PT Start Time 1737    PT Stop Time 1815    PT Time Calculation (min) 38 min    Activity Tolerance Patient tolerated treatment well    Behavior During Therapy [facility] for tasks assessed/performed               Past Medical History:  Diagnosis Date   Chest pain 07/09/2020   COVID-19 virus infection 11/2019   Genital warts    Health maintenance examination 04/15/2021   History of COVID-19 11/25/2019   Inflammatory arthritis 04/27/2023   Normal pregnancy 06/12/2011   SVD (spontaneous vaginal delivery) 10/12/2016   Upper respiratory tract infection 04/23/2022   Past Surgical History:  Procedure Laterality Date   COLONOSCOPY  11/2022   1cm SSP, rpt 3 yrs [address])   ESOPHAGOGASTRODUODENOSCOPY  11/2022   small HH, normal esophagus, biopsy with active inflammation and metaplasia but not endoscopically consistent with Barrett [address])   HYSTEROSCOPY     TONSILLECTOMY AND ADENOIDECTOMY     WISDOM TOOTH EXTRACTION     Patient Active Problem List   Diagnosis Date Noted   Blurry vision, right eye 04/27/2023   Inflammatory arthritis 04/27/2023   Vertigo 12/14/2022   Allergic reaction to chemical substance 12/14/2022   Health maintenance examination 04/15/2021   Abnormal TSH 04/14/2021   Chest pain 07/09/2020   Epigastric pain 07/09/2020   Carrier of hemochromatosis HFE gene mutation 09/19/2019   Family history of hemochromatosis 09/10/2019   Headache 08/13/2019   Abnormal cervical  Papanicolaou smear 12/06/2017   Irregular periods 12/06/2017   Oligomenorrhea 12/06/2017   Overweight (BMI 25.0-29.9) 12/06/2017   Carrier of group B Streptococcus 09/07/2016    PCP: Eustaquio Boyden, MD  REFERRING PROVIDER: Antony Madura, MD (neurology)  REFERRING DIAG: neck pain  THERAPY DIAG:  Cervicalgia  Intractable headache, unspecified chronicity pattern, unspecified headache type  Rationale for Evaluation and Treatment: Rehabilitation  ONSET DATE: 2017  PERTINENT HISTORY:  Patient is a 37 y.o. female who presents to outpatient physical therapy with a referral for medical diagnosis neck pain. This patient's chief complaints consist of chronic neck pain and constant right sided headache leading to the following functional deficits: difficulty working, looking down, lifting weights/working out, moving head up and down and side to side to see environment, sleeping, checking her blind spot when driving, prolonged positions. Relevant past medical history and comorbidities include has seronegative RA (recently diagnosed, not treated); Carrier of group B Streptococcus; Abnormal cervical Papanicolaou smear; Irregular periods; Oligomenorrhea; Overweight (BMI 25.0-29.9); Headache; Family history of hemochromatosis; Carrier of hemochromatosis HFE gene mutation; Chest pain; Epigastric pain; Abnormal TSH; Health maintenance examination; Vertigo; Allergic reaction to chemical substance; Blurry vision, right eye; and Inflammatory arthritis. She has a past medical history of Chest pain (07/09/2020), COVID-19 virus infection (11/2019), History of COVID-19 (11/25/2019), Inflammatory arthritis (04/27/2023), Normal pregnancy (06/12/2011), SVD (spontaneous vaginal delivery) (10/12/2016). She has a past surgical  history that includes Wisdom tooth extraction; Tonsillectomy and adenoidectomy; Hysteroscopy; Colonoscopy (11/2022); and Esophagogastroduodenoscopy (11/2022). Patient denies hx of cancer, stroke,  seizures, lung problems, heart problems, diabetes, unexplained weight loss, unexplained changes in bowel or bladder problems, unexplained stumbling or dropping things, osteoporosis, and spinal surgery.   OBJECTIVE  SUBJECTIVE STATEMENT: Patient states she canceled her PT visit last week because she was feeling better. Her relative passed away last week and she did not get a lot of sleep and had a lot of stress, so she was not doing her HEP as much. She states her neck still feels good when rotating it horizontally and she has had only minimal headache if any, but she feels increased pain at the base of her lower cervical spine, especially when moving in the sagittal plane. Today's visit continued with some interventions for extension directional preference which improved her symptoms utilized dry needling to help address the tightness at her suboccipital region that causes radiation to head with palpation. She also was found to have hypomobility and pain at approx C3 that improved with grade III CPA and decreased the sense of tightness in her neck with sagittal plane movents by the end of the session. Plan to complete 10th visit progress note next session. Patient would benefit from continued management of limiting condition by skilled physical therapist to address remaining impairments and functional limitations to work towards stated goals and return to PLOF or maximal functional independence.   PAIN:  NPRS: Current: 3/10 lower mid cervical spine   PATIENT GOALS: "hoping it will help my headache" "to be able to not have all this tension"  NEXT MD VISIT: not yet scheduled.   OBJECTIVE  TODAY'S TREATMENT:   Therapeutic exercise: to centralize symptoms and improve ROM, strength, muscular endurance, and activity tolerance required for successful completion of functional activities.  - seated cervical spine retraction with self overpressure, 2x10 (Manual therapy / dry needling - see below).  -  seated AROM cervical spine rotation to check response to interventions (improved after dry needling/manual)  Manual therapy: to reduce pain and tissue tension, improve range of motion, neuromodulation, in order to promote improved ability to complete functional activities. SEATED with towel roll at lumbar spine - Cervical spine retraction with clinician overpressure, 3x6  SUPINE - STM with suboccipital release  - manual distraction to cervical spine - CPA along cervical segments C3-C6, with increased repetition at C3 where it felt hypomobile and was mildly painful, grade III,  PRONE - STM to bilateral posterior cervical spine musculature, L > R  Modality: Dry needling performed to posterior neck region to decrease pain and spasms along patient's neck and head region with patient in prone utilizing 1/1 dry needle(s) .25mm x 30/43mm with 2 sticks each side at upper cervical multifidi and 1 sick at right UT and 3 sticks at left UT. Patient educated about the risks and benefits from therapy and verbally consents to treatment.  Dry needling performed by Luretha Murphy. [name] PT, DPT who is certified in this technique.  Patient required multimodal cuing for proper technique and to facilitate improved neuromuscular control, strength, range of motion, and functional ability resulting in improved performance and form.  PATIENT EDUCATION:  Education details: Exercise purpose/form. Self management techniques. Education on diagnosis, prognosis, POC, anatomy and physiology of current condition. Education on HEP including handout. Person educated: Patient Education method: Explanation, Demonstration, Tactile cues, Verbal cues, and Handouts Education comprehension: verbalized understanding, returned demonstration, and needs further education  HOME EXERCISE PROGRAM:  HOME EXERCISE PROGRAM [[id number]] View at [email or web address] using code: [email or web address] Cervical Retraction in Sitting w/OP - MDT -  Repeat 10  Repetitions, Hold 1 Second(s), Complete 2 Sets, Perform 3 Times a Day  Cervical Retraction/Extension in Sitting - MDT -  Repeat 10 Repetitions, Hold 1 Second(s), Complete 2 Sets, Perform 3 Times a Day  HOME EXERCISE PROGRAM [[email or web address]] View at [email or web address] using code: [email or web address] Neck Rotation and Nod- capital, atlanto occipital mobilization  -  Repeat 20 Repetitions, Hold 1 Second(s), Complete 1 Set, Perform 3 Times a Day  Suboccipital Stretch and C1-C2 Mobilization -  Repeat 3 Repetitions, Hold 30 Seconds, Perform 3 Times a Day  Access Code: [email or web address] URL: [email or web address] Date: 07/03/2023 Prepared by: Norton Blizzard  Exercises - Seated Posture with Lumbar Roll   Access Code: [email or web address] URL: [email or web address] Date: 08/15/2023 Prepared by: Norton Blizzard  Exercises - Standing Shoulder Horizontal Abduction with Resistance  - 3-5 x weekly - 3 sets - 10 reps - Standing Shoulder Diagonal Horizontal Abduction 60/120 Degrees with Resistance  - 3-5 x weekly - 3 sets - 10 reps - Supine Deep Neck Flexor Training - Hold  - 3-5 x weekly - 5 sets - 40 seconds hold - Prone W Scapular Retraction  - 3-5 x weekly - 3 sets - 10 reps    ASSESSMENT:  CLINICAL IMPRESSION:  Patient arrives reporting no pain since last PT session and significant improvement in condition. Today's session included dry needling focused on locations that continue to radiate towards head with palpation (right upper cervical multifidi) and other regions that could contribute, followed by exercise for postural and neck strengthening. Pateint tolerated treatment well, although she was sore following needling. Plan to work towards discharge with patient returning if needed over the next 2 weeks or more. Patient would benefit from continued management of limiting condition by skilled physical therapist to address remaining impairments and functional limitations to work towards stated goals and  return to PLOF or maximal functional independence.    OBJECTIVE IMPAIRMENTS: decreased activity tolerance, decreased endurance, decreased knowledge of condition, decreased ROM, decreased strength, hypomobility, impaired perceived functional ability, increased muscle spasms, impaired flexibility, improper body mechanics, postural dysfunction, and pain.   ACTIVITY LIMITATIONS: sitting and sleeping  PARTICIPATION LIMITATIONS: interpersonal relationship, driving, community activity, occupation, and   difficulty working, looking down, lifting weights/working out, moving head up and down and side to side to see environment, sleeping, checking her blind spot when driving, prolonged positions  PERSONAL FACTORS: Past/current experiences, Profession, Time since onset of injury/illness/exacerbation, and 3+ comorbidities:   has seronegative RA (recently diagnosed, not treated); Carrier of group B Streptococcus; Abnormal cervical Papanicolaou smear; Irregular periods; Oligomenorrhea; Overweight (BMI 25.0-29.9); Headache; Family history of hemochromatosis; Carrier of hemochromatosis HFE gene mutation; Chest pain; Epigastric pain; Abnormal TSH; Health maintenance examination; Vertigo; Allergic reaction to chemical substance; Blurry vision, right eye; and Inflammatory arthritis. She has a past medical history of Chest pain (07/09/2020), COVID-19 virus infection (11/2019), History of COVID-19 (11/25/2019), Inflammatory arthritis (04/27/2023), Normal pregnancy (06/12/2011), SVD (spontaneous vaginal delivery) (10/12/2016). She has a past surgical history that includes Wisdom tooth extraction; Tonsillectomy and adenoidectomy; Hysteroscopy; Colonoscopy (11/2022); and Esophagogastroduodenoscopy (11/2022) are also affecting patient's functional outcome.   REHAB POTENTIAL: Good  CLINICAL DECISION MAKING: Evolving/moderate complexity  EVALUATION COMPLEXITY: Moderate   GOALS: Goals reviewed with patient? No  SHORT TERM  GOALS: Target date: 07/17/2023  Patient will be independent with initial home exercise program for self-management of symptoms. Baseline: Initial HEP provided at IE (07/03/23); Goal  status: In-progress   LONG TERM GOALS: Target date: 09/25/2023  Patient will be independent with a long-term home exercise program for self-management of symptoms.  Baseline: Initial HEP provided at IE (07/03/23); Goal status: In-progress  2.  Patient will demonstrate improved FOTO to equal or greater than 66 by visit #10 to demonstrate improvement in overall condition and self-reported functional ability.  Baseline: 54 (07/03/23); Goal status: In-progress  3.  Patient will report equal or less than 2 headache days per week to improve her ability to participate in her work and leisure activities with less difficulty.  Baseline: headache constant (07/03/23); Goal status: In-progress  4.  Patient will demonstrate full cervical spine AROM without increased pain except temporary end range discomfort to improve her ability to check her blind spot and view her surroundings.  Baseline: painful and limited - see objective (07/03/23); Goal status: In-progress  5.  Patient will complete community, work and/or recreational activities with 75% less limitation due to current condition.  Baseline: difficulty working, looking down, lifting weights/working out, moving head up and down and side to side to see environment, sleeping, checking her blind spot when driving, prolonged positions (07/03/23); Goal status: In-progress  6.  Patient will report pain equal or less to 3/10 with functional activities to improve her ability to complete work tasks, sleep, and work out.  Baseline: up to 7/10 (07/03/23):  Goal status: In-progress    PLAN:  PT FREQUENCY: 1-2x/week  PT DURATION: 12 weeks  PLANNED INTERVENTIONS: Therapeutic exercises, Therapeutic activity, Neuromuscular re-education, Patient/Family education, Self Care,  Joint mobilization, Dry Needling, Electrical stimulation, Spinal mobilization, Cryotherapy, Moist heat, Manual therapy, and Re-evaluation  PLAN FOR NEXT SESSION: update HEP as appropriate, cervical retraction, upper cervical spine flexion based exercises, postural strengthening, manual therapy, education. Progressive functional/postural strengthening.    Cira Rue, PT, DPT 08/28/2023, 7:44 PM  [facility] [address] [place], [place] [address] P: [phone] I F: [phone]

## 2023-09-02 ENCOUNTER — Encounter: Payer: BC Managed Care – PPO | Admitting: Physical Therapy

## 2023-09-04 ENCOUNTER — Ambulatory Visit: Payer: BC Managed Care – PPO | Admitting: Physical Therapy

## 2023-09-05 ENCOUNTER — Encounter: Payer: BC Managed Care – PPO | Admitting: Physical Therapy

## 2023-09-09 ENCOUNTER — Encounter: Payer: BC Managed Care – PPO | Admitting: Physical Therapy

## 2023-09-11 ENCOUNTER — Encounter: Payer: BC Managed Care – PPO | Admitting: Physical Therapy

## 2023-09-17 ENCOUNTER — Encounter: Payer: BC Managed Care – PPO | Admitting: Physical Therapy

## 2023-09-19 ENCOUNTER — Encounter: Payer: BC Managed Care – PPO | Admitting: Physical Therapy

## 2023-09-23 ENCOUNTER — Encounter: Payer: BC Managed Care – PPO | Admitting: Physical Therapy

## 2023-09-23 DIAGNOSIS — R102 Pelvic and perineal pain: Secondary | ICD-10-CM | POA: Diagnosis not present

## 2023-09-25 ENCOUNTER — Encounter: Payer: BC Managed Care – PPO | Admitting: Physical Therapy

## 2023-09-30 ENCOUNTER — Encounter: Payer: BC Managed Care – PPO | Admitting: Physical Therapy

## 2023-10-02 ENCOUNTER — Encounter: Payer: BC Managed Care – PPO | Admitting: Physical Therapy

## 2023-10-10 IMAGING — DX DG CHEST 2V
2 series · 3 of 3 positions shown · non-contrast
Comparison: Prior chest radiographs 07/06/2020 and earlier.

CLINICAL DATA: Provided history: Acute cough. Upper respiratory
tract infection, unspecified type. Cough for approximately
weeks, increasing.

EXAM:
CHEST - 2 VIEW

[chest pa]
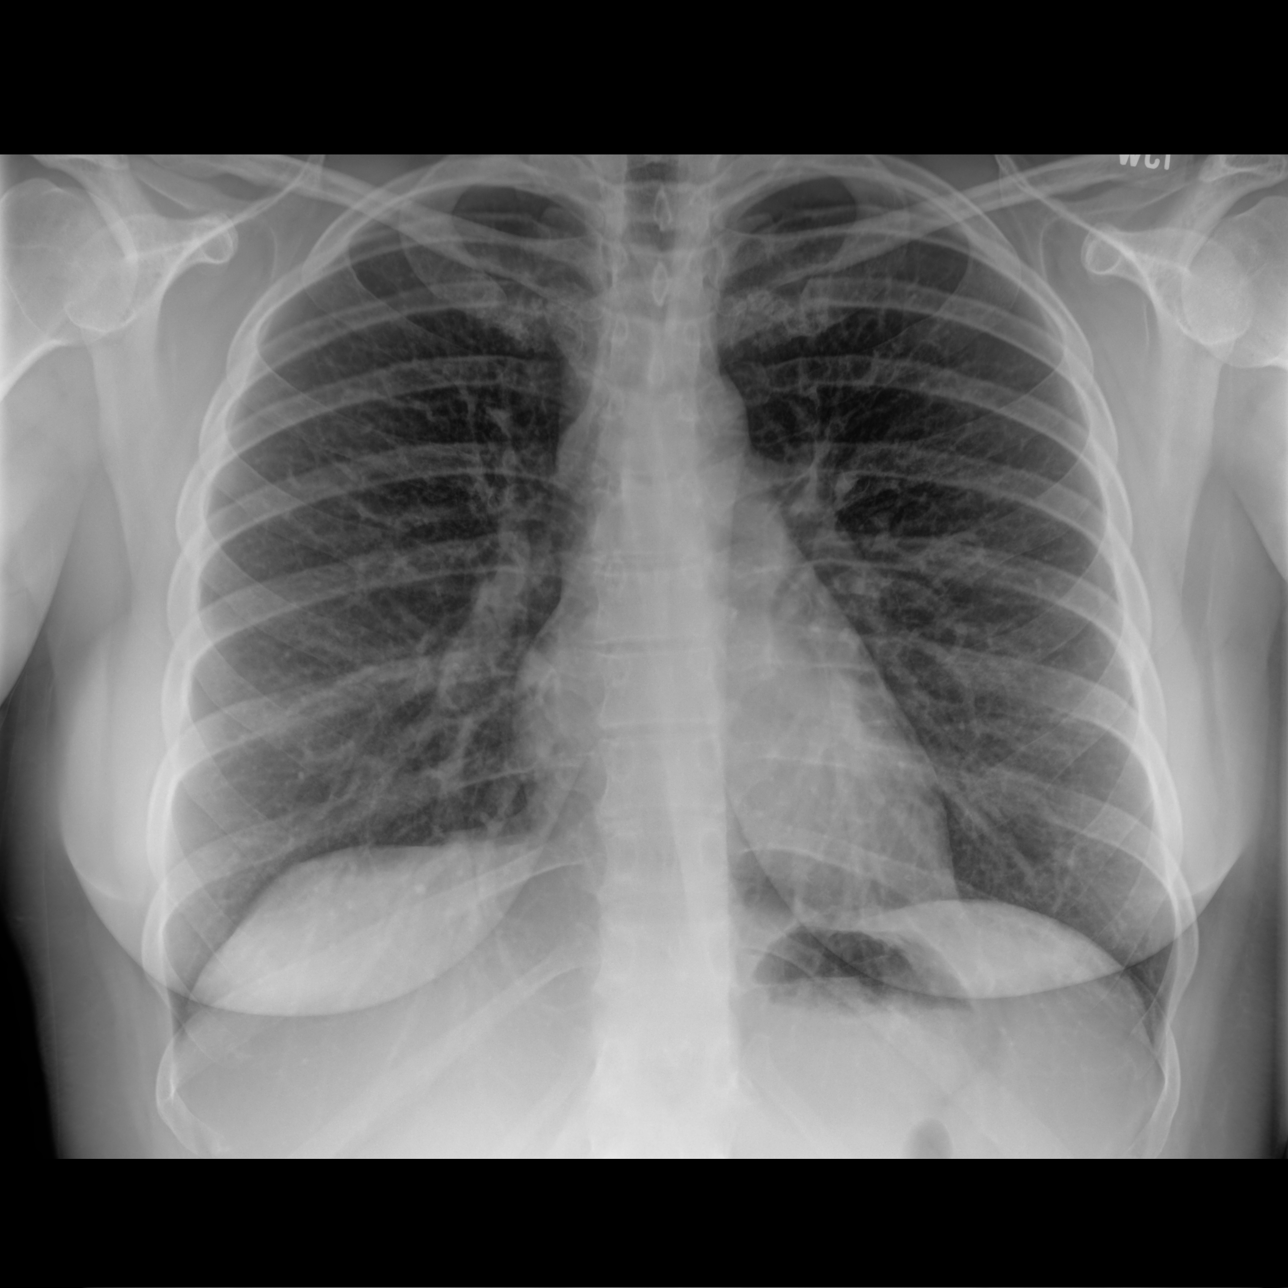

[Series 2: chest lat · 2 of 2 slices shown]
[im 1/2]
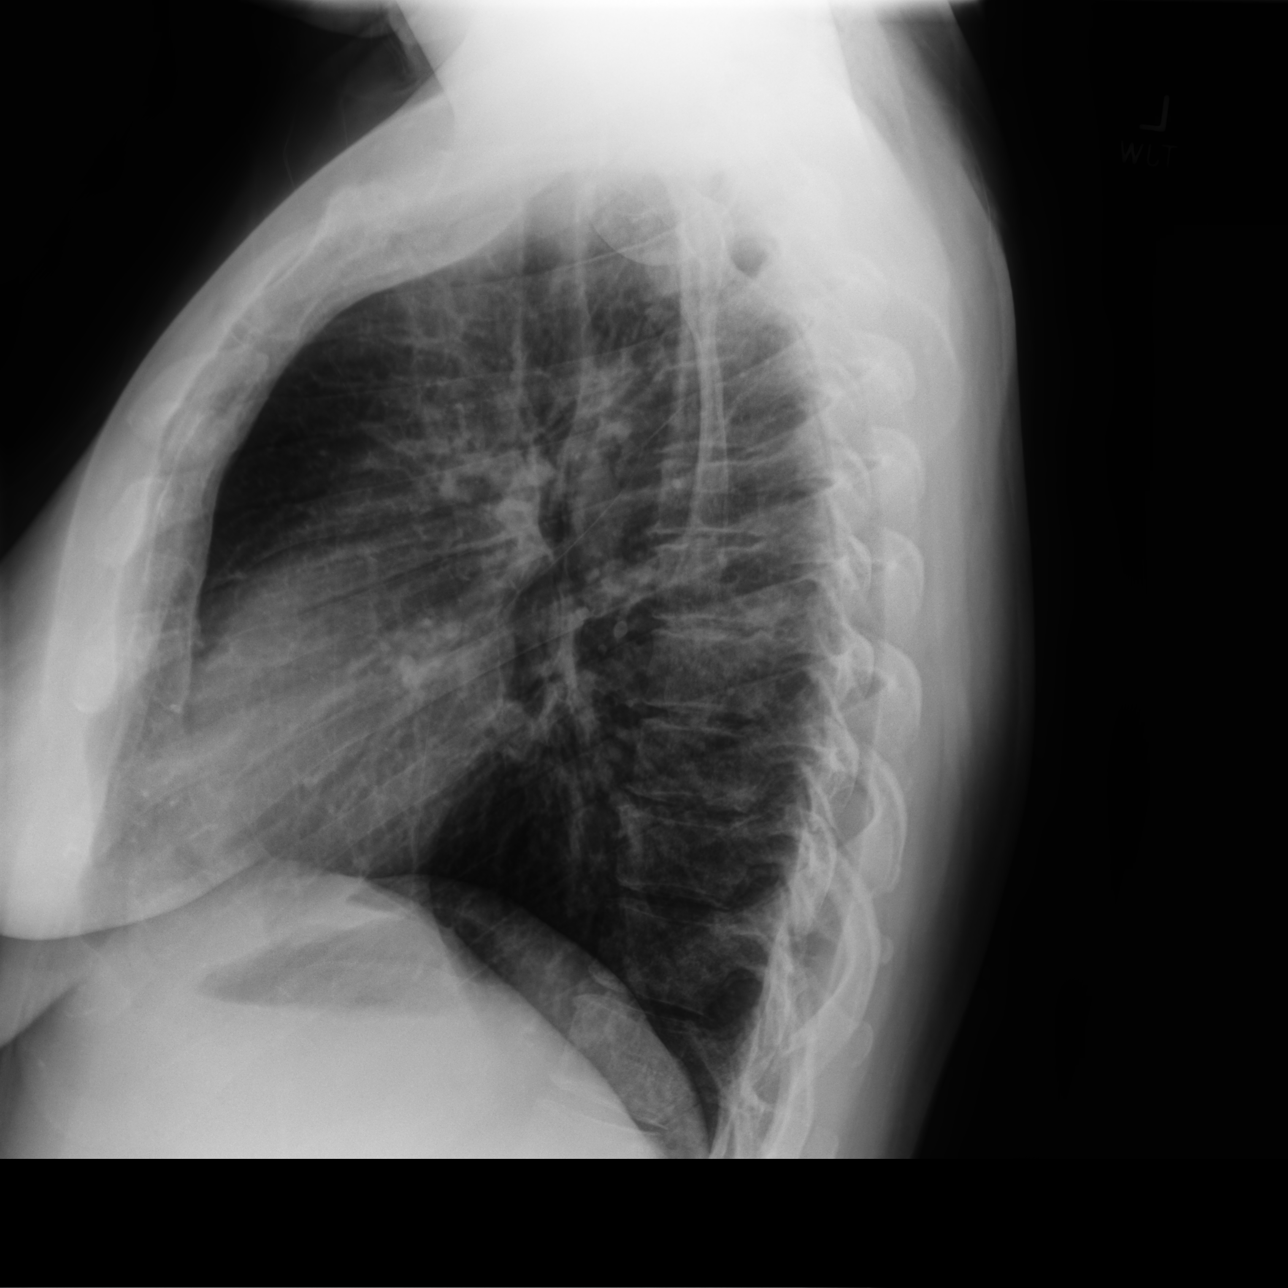
[im 2/2]
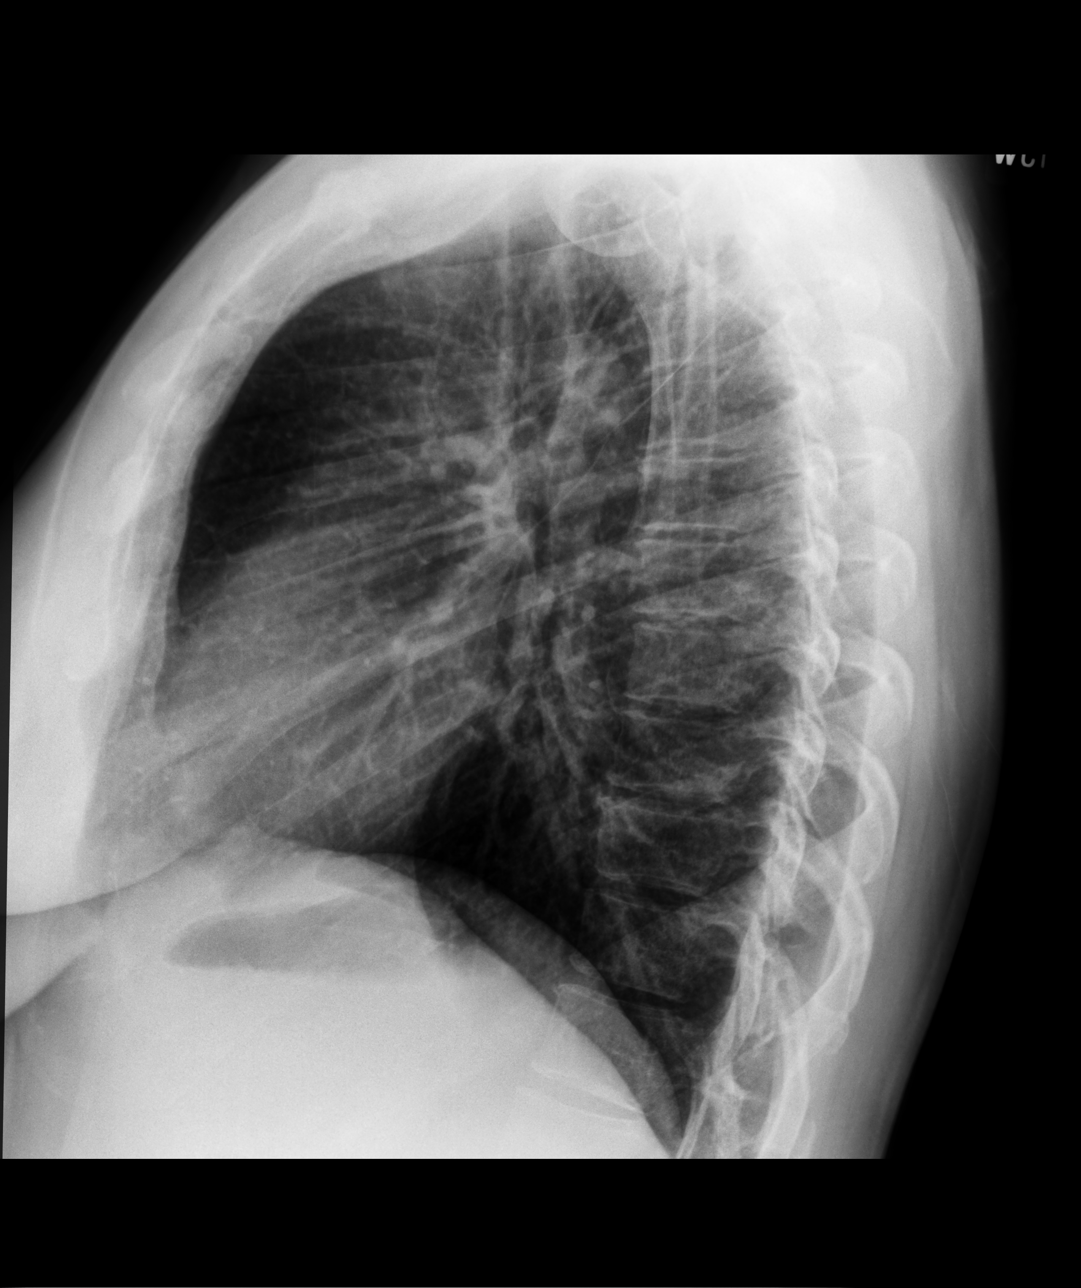

[3 of 3 positions shown; findings below may reference images not displayed]

FINDINGS: Heart size within normal limits. No appreciable airspace
consolidation. No evidence of pleural effusion or pneumothorax. No
acute bony abnormality identified.
IMPRESSION: No evidence of active cardiopulmonary disease.

## 2023-11-05 ENCOUNTER — Ambulatory Visit: Payer: Self-pay | Admitting: Family Medicine

## 2023-11-05 DIAGNOSIS — Z130 Encounter for screening for diseases of the blood and blood-forming organs and certain disorders involving the immune mechanism: Secondary | ICD-10-CM | POA: Diagnosis not present

## 2023-11-05 DIAGNOSIS — N3010 Interstitial cystitis (chronic) without hematuria: Secondary | ICD-10-CM | POA: Diagnosis not present

## 2023-11-05 DIAGNOSIS — Z01411 Encounter for gynecological examination (general) (routine) with abnormal findings: Secondary | ICD-10-CM | POA: Diagnosis not present

## 2023-11-05 NOTE — Telephone Encounter (Signed)
 Chief Complaint: SOB Symptoms: cough, chest tightness, dizzy Frequency: comes and goes  Disposition: [] ED /[] Urgent Care (no appt availability in office) / [x] Appointment(In office/virtual)/ []  [place] [facility]/ [] Home Care/ [] Refused Recommended Disposition /[] [place] [facility]/ []  Follow-up with PCP Additional Notes: Pt is at work but complaining of SOB for two weeks that is on and off. Pt states she has chest tightness mostly on right side and dizzy at times. Pt states she feels this SOB 5-6 times a day. When it hits her, she sits down to calm down and it goes away. Pt wondered if it was panic attack, but also stated it makes her cough then feels the chest tightness. Pt stated it is not getting better and wants to be checked out. Appt made for 11/07/23 with provider. Pt was not SOB while on phone with  RN. RN gave care advised and pt verbalized understanding and to call back if symptoms worsen.          Copied from CRM [id number]. Topic: Clinical - Red Word Triage >> Nov 05, 2023 11:43 AM Graeme ORN wrote: Red Word that prompted transfer to Nurse Triage: Tightness and congestion in chest, dizziness, fatigue, shortness of breath. Reason for Disposition  [1] MODERATE longstanding difficulty breathing (e.g., speaks in phrases, SOB even at rest, pulse 100-120) AND [2] SAME as normal  Answer Assessment - Initial Assessment Questions 1. RESPIRATORY STATUS: Describe your breathing? (e.g., wheezing, shortness of breath, unable to speak, severe coughing)      Short of breath 2. ONSET: When did this breathing problem begin?      2 weeks ago 3. PATTERN Does the difficult breathing come and go, or has it been constant since it started?      Comes and goes 4. SEVERITY: How bad is your breathing? (e.g., mild, moderate, severe)    - MILD: No SOB at rest, mild SOB with walking, speaks normally in sentences, can lie down, no retractions, pulse < 100.    - MODERATE: SOB at rest, SOB  with minimal exertion and prefers to sit, cannot lie down flat, speaks in phrases, mild retractions, audible wheezing, pulse 100-120.    - SEVERE: Very SOB at rest, speaks in single words, struggling to breathe, sitting hunched forward, retractions, pulse > 120      moderate 5. RECURRENT SYMPTOM: Have you had difficulty breathing before? If Yes, ask: When was the last time? and What happened that time?      no 6. CARDIAC HISTORY: Do you have any history of heart disease? (e.g., heart attack, angina, bypass surgery, angioplasty)      no 7. LUNG HISTORY: Do you have any history of lung disease?  (e.g., pulmonary embolus, asthma, emphysema)     no 8. CAUSE: What do you think is causing the breathing problem?      Not sure 9. OTHER SYMPTOMS: Do you have any other symptoms? (e.g., dizziness, runny nose, cough, chest pain, fever)     Runny nose, chest pain, cough 10. O2 SATURATION MONITOR:  Do you use an oxygen saturation monitor (pulse oximeter) at home? If Yes, ask: What is your reading (oxygen level) today? What is your usual oxygen saturation reading? (e.g., 95%)       na 11. PREGNANCY: Is there any chance you are pregnant? When was your last menstrual period?       no 12. TRAVEL: Have you traveled out of the country in the last month? (e.g., travel history, exposures)  no  Protocols used: Breathing Difficulty-A-AH

## 2023-11-07 ENCOUNTER — Ambulatory Visit: Payer: BC Managed Care – PPO | Admitting: Family Medicine

## 2023-11-07 ENCOUNTER — Ambulatory Visit (INDEPENDENT_AMBULATORY_CARE_PROVIDER_SITE_OTHER)
Admission: RE | Admit: 2023-11-07 | Discharge: 2023-11-07 | Disposition: A | Discharge status: Home / Self Care | Payer: BC Managed Care – PPO | Source: Ambulatory Visit | Attending: Family Medicine | Admitting: Family Medicine

## 2023-11-07 VITALS — BP 128/76 | HR 85 | Temp 97.8°F | Ht 63.5 in | Wt 166.0 lb

## 2023-11-07 DIAGNOSIS — R0602 Shortness of breath: Secondary | ICD-10-CM | POA: Diagnosis not present

## 2023-11-07 DIAGNOSIS — R5383 Other fatigue: Secondary | ICD-10-CM | POA: Diagnosis not present

## 2023-11-07 DIAGNOSIS — R079 Chest pain, unspecified: Secondary | ICD-10-CM | POA: Diagnosis not present

## 2023-11-07 HISTORY — DX: Shortness of breath: R06.02

## 2023-11-07 NOTE — Assessment & Plan Note (Signed)
 Acute, exam within the normal limits.  Patient without clear obvious sign of infection. Offered EKG to evaluate chest pain and shortness of breath, but patient is not interested in this evaluation at this time.  I think that is reasonable given she is young and with minimal cardiovascular risk. Will evaluate with CBC and thyroid  for anemia and thyroid  issue. Will check chest x-ray to rule out pneumonia although lung exam clear. No suggestion of DVT and pulmonary embolus as patient's vitals within the normal range.  Return and ER precautions provided.

## 2023-11-07 NOTE — Telephone Encounter (Addendum)
 Noted. Appreciate Dr Ermalene Searing evaluating patient.  Prior chest pain as sequelae of COVID as well as presumed gastritis treated with PPI, cancelled GI appt due to improvement (2022).  Not on hormonal therapy.  H/o inflammatory arthritis has seen rheum.

## 2023-11-07 NOTE — Progress Notes (Signed)
 Patient ID: Martha Coleman, female    DOB: 1986/07/16, 38 y.o.   MRN: [id number]  This visit was conducted in person.  BP 128/76   Pulse 85   Temp 97.8 F (36.6 C) (Temporal)   Ht 5' 3.5 (1.613 m)   Wt 166 lb (75.3 kg)   SpO2 99%   BMI 28.94 kg/m    CC:  Chief Complaint  Patient presents with   Shortness of Breath    2 weeks she has been experiencing intermittent chest heaviness and sob.     Subjective:   HPI: Martha Coleman is a 38 y.o. female patient of Dr. Rilla presenting on 11/07/2023 for Shortness of Breath (2 weeks she has been experiencing intermittent chest heaviness and sob. )  New onset intermittent chest heaviness tightness and shortness of breath ongoing for the past 2 weeks. Notes at rest, but worse with exertion.  Has gradually worsened.  She has been feeling more fatigued.  Sharp pains in right upper anterior checst wall, radiated to back, right breast.  Occ feeling chills, no fever.   No cough, some sinus congestion.  No swelling in legs, no calf pain.   No asthma, allergy history.  Former small amount smoker only for 1 year in 74s.   Currently on menses.. regular not heavy.  No anemia or thyroid  history.    No family history of heart disease, mother has  clotting disorder.  BP Readings from Last 3 Encounters:  11/07/23 128/76  04/26/23 124/86  04/25/23 127/84   She denies increased stress or anxiety.      Relevant past medical, surgical, family and social history reviewed and updated as indicated. Interim medical history since our last visit reviewed. Allergies and medications reviewed and updated. Outpatient Medications Prior to Visit  Medication Sig Dispense Refill   Cetirizine HCl (ZYRTEC ALLERGY PO) Take by mouth.     oxybutynin (DITROPAN-XL) 5 MG 24 hr tablet Take 5 mg by mouth daily.     spironolactone (ALDACTONE) 100 MG tablet Take 100 mg by mouth daily. With food     fluticasone  (FLONASE ) 50 MCG/ACT nasal spray USE TWO  SPRAYS IN EACH NOSTRIL ONE TIME DAILY (Patient not taking: Reported on 11/07/2023) 16 mL 11   gabapentin  (NEURONTIN ) 300 MG capsule Take 1 capsule (300 mg total) by mouth 3 (three) times daily. Take 1 capsule (300 mg) at bedtime for 7 days, if tolerated increased increase to 300 mg twice daily for 7 days, if tolerated increase to 300 mg three times daily thereafter (Patient not taking: Reported on 11/07/2023) 90 capsule 11   indomethacin  (INDOCIN  SR) 75 MG CR capsule Take 1 capsule (75 mg total) by mouth 2 (two) times daily with a meal. (Patient not taking: Reported on 11/07/2023) 60 capsule 2   scopolamine  (TRANSDERM-SCOP) 1 MG/3DAYS Place 1 patch (1.5 mg total) onto the skin every 3 (three) days. (Patient not taking: Reported on 11/07/2023) 4 patch 0   No facility-administered medications prior to visit.     Per HPI unless specifically indicated in ROS section below Review of Systems  Constitutional:  Positive for chills. Negative for fatigue and fever.  HENT:  Negative for congestion.   Eyes:  Negative for pain.  Respiratory:  Positive for shortness of breath. Negative for cough.   Cardiovascular:  Positive for chest pain. Negative for palpitations and leg swelling.  Gastrointestinal:  Negative for abdominal pain.  Genitourinary:  Negative for dysuria and vaginal bleeding.  Musculoskeletal:  Negative for back pain.  Neurological:  Negative for syncope, light-headedness and headaches.  Psychiatric/Behavioral:  Negative for dysphoric mood.    Objective:  BP 128/76   Pulse 85   Temp 97.8 F (36.6 C) (Temporal)   Ht 5' 3.5 (1.613 m)   Wt 166 lb (75.3 kg)   SpO2 99%   BMI 28.94 kg/m   Wt Readings from Last 3 Encounters:  11/07/23 166 lb (75.3 kg)  04/26/23 169 lb 4 oz (76.8 kg)  04/25/23 171 lb (77.6 kg)      Physical Exam Constitutional:      General: She is not in acute distress.    Appearance: Normal appearance. She is well-developed. She is not ill-appearing or toxic-appearing.   HENT:     Head: Normocephalic.     Right Ear: Hearing, tympanic membrane, ear canal and external ear normal. Tympanic membrane is not erythematous, retracted or bulging.     Left Ear: Hearing, tympanic membrane, ear canal and external ear normal. Tympanic membrane is not erythematous, retracted or bulging.     Nose: No mucosal edema or rhinorrhea.     Right Sinus: No maxillary sinus tenderness or frontal sinus tenderness.     Left Sinus: No maxillary sinus tenderness or frontal sinus tenderness.     Mouth/Throat:     Pharynx: Uvula midline.  Eyes:     General: Lids are normal. Lids are everted, no foreign bodies appreciated.     Conjunctiva/sclera: Conjunctivae normal.     Pupils: Pupils are equal, round, and reactive to light.  Neck:     Thyroid : No thyroid  mass or thyromegaly.     Vascular: No carotid bruit.     Trachea: Trachea normal.  Cardiovascular:     Rate and Rhythm: Normal rate and regular rhythm.     Pulses: Normal pulses.     Heart sounds: Normal heart sounds, S1 normal and S2 normal. No murmur heard.    No friction rub. No gallop.  Pulmonary:     Effort: Pulmonary effort is normal. No tachypnea or respiratory distress.     Breath sounds: Normal breath sounds. No decreased breath sounds, wheezing, rhonchi or rales.  Abdominal:     General: Bowel sounds are normal.     Palpations: Abdomen is soft.     Tenderness: There is no abdominal tenderness.  Musculoskeletal:     Cervical back: Normal range of motion and neck supple.  Skin:    General: Skin is warm and dry.     Findings: No rash.  Neurological:     Mental Status: She is alert.  Psychiatric:        Mood and Affect: Mood is not anxious or depressed.        Speech: Speech normal.        Behavior: Behavior normal. Behavior is cooperative.        Thought Content: Thought content normal.        Judgment: Judgment normal.       Results for orders placed or performed in visit on 04/19/23  Lipid panel    Collection Time: 04/19/23  7:32 AM  Result Value Ref Range   Cholesterol 195 0 - 200 mg/dL   Triglycerides 10.9 0.0 - 149.0 mg/dL   HDL 55.89 >60.99 mg/dL   VLDL 82.1 0.0 - 59.9 mg/dL   LDL Cholesterol 866 (H) 0 - 99 mg/dL   Total CHOL/HDL Ratio 4    NonHDL 150.63   Comprehensive metabolic panel  Collection Time: 04/19/23  7:32 AM  Result Value Ref Range   Sodium 140 135 - 145 mEq/L   Potassium 4.5 3.5 - 5.1 mEq/L   Chloride 105 96 - 112 mEq/L   CO2 27 19 - 32 mEq/L   Glucose, Bld 86 70 - 99 mg/dL   BUN 13 6 - 23 mg/dL   Creatinine, Ser 8.98 0.40 - 1.20 mg/dL   Total Bilirubin 0.8 0.2 - 1.2 mg/dL   Alkaline Phosphatase 37 (L) 39 - 117 U/L   AST 14 0 - 37 U/L   ALT 17 0 - 35 U/L   Total Protein 6.7 6.0 - 8.3 g/dL   Albumin 4.2 3.5 - 5.2 g/dL   GFR 28.21 >39.99 mL/min   Calcium  9.2 8.4 - 10.5 mg/dL  CBC with Differential/Platelet   Collection Time: 04/19/23  7:32 AM  Result Value Ref Range   WBC 6.7 4.0 - 10.5 K/uL   RBC 4.78 3.87 - 5.11 Mil/uL   Hemoglobin 14.7 12.0 - 15.0 g/dL   HCT 56.5 63.9 - 53.9 %   MCV 90.9 78.0 - 100.0 fl   MCHC 33.8 30.0 - 36.0 g/dL   RDW 87.1 88.4 - 84.4 %   Platelets 282.0 150.0 - 400.0 K/uL   Neutrophils Relative % 67.0 43.0 - 77.0 %   Lymphocytes Relative 20.9 12.0 - 46.0 %   Monocytes Relative 5.0 3.0 - 12.0 %   Eosinophils Relative 6.6 (H) 0.0 - 5.0 %   Basophils Relative 0.5 0.0 - 3.0 %   Neutro Abs 4.5 1.4 - 7.7 K/uL   Lymphs Abs 1.4 0.7 - 4.0 K/uL   Monocytes Absolute 0.3 0.1 - 1.0 K/uL   Eosinophils Absolute 0.4 0.0 - 0.7 K/uL   Basophils Absolute 0.0 0.0 - 0.1 K/uL  T4, free   Collection Time: 04/19/23  7:32 AM  Result Value Ref Range   Free T4 0.75 0.60 - 1.60 ng/dL  TSH   Collection Time: 04/19/23  7:32 AM  Result Value Ref Range   TSH 3.95 0.35 - 5.50 uIU/mL    Assessment and Plan  SOB (shortness of breath) Assessment & Plan: Acute, exam within the normal limits.  Patient without clear obvious sign of  infection. Offered EKG to evaluate chest pain and shortness of breath, but patient is not interested in this evaluation at this time.  I think that is reasonable given she is young and with minimal cardiovascular risk. Will evaluate with CBC and thyroid  for anemia and thyroid  issue. Will check chest x-ray to rule out pneumonia although lung exam clear. No suggestion of DVT and pulmonary embolus as patient's vitals within the normal range.  Return and ER precautions provided.  Orders: -     DG Chest 2 View -     CBC with Differential/Platelet -     TSH  Chest pain, unspecified type  Other fatigue -     CBC with Differential/Platelet -     TSH    No follow-ups on file.   Greig Ring, MD

## 2023-11-08 ENCOUNTER — Encounter: Payer: Self-pay | Admitting: Family Medicine

## 2023-11-08 LAB — CBC WITH DIFFERENTIAL/PLATELET
Basophils Absolute: 0 10*3/uL (ref 0.0–0.1)
Basophils Relative: 0.6 % (ref 0.0–3.0)
Eosinophils Absolute: 0.3 10*3/uL (ref 0.0–0.7)
Eosinophils Relative: 3.7 % (ref 0.0–5.0)
HCT: 44.2 % (ref 36.0–46.0)
Hemoglobin: 15.4 g/dL — ABNORMAL HIGH (ref 12.0–15.0)
Lymphocytes Relative: 23.6 % (ref 12.0–46.0)
Lymphs Abs: 1.7 10*3/uL (ref 0.7–4.0)
MCHC: 34.8 g/dL (ref 30.0–36.0)
MCV: 89.3 fL (ref 78.0–100.0)
Monocytes Absolute: 0.3 10*3/uL (ref 0.1–1.0)
Monocytes Relative: 4.8 % (ref 3.0–12.0)
Neutro Abs: 4.8 10*3/uL (ref 1.4–7.7)
Neutrophils Relative %: 67.3 % (ref 43.0–77.0)
Platelets: 296 10*3/uL (ref 150.0–400.0)
RBC: 4.95 Mil/uL (ref 3.87–5.11)
RDW: 12.5 % (ref 11.5–15.5)
WBC: 7.1 10*3/uL (ref 4.0–10.5)

## 2023-11-08 LAB — TSH: TSH: 3.53 u[IU]/mL (ref 0.35–5.50)

## 2023-12-19 ENCOUNTER — Encounter: Payer: Self-pay | Admitting: Family Medicine

## 2023-12-19 MED ORDER — OSELTAMIVIR PHOSPHATE 75 MG PO CAPS: 75.0000 mg | ORAL_CAPSULE | Freq: Every day | ORAL | 0 refills | Status: DC

## 2024-01-26 DIAGNOSIS — R42 Dizziness and giddiness: Secondary | ICD-10-CM | POA: Diagnosis not present

## 2024-01-27 ENCOUNTER — Encounter: Payer: Self-pay | Admitting: Family Medicine

## 2024-01-27 ENCOUNTER — Encounter: Payer: Self-pay | Admitting: *Deleted

## 2024-01-27 ENCOUNTER — Ambulatory Visit: Admitting: Family Medicine

## 2024-01-27 VITALS — BP 122/82 | HR 97 | Temp 99.4°F | Ht 63.5 in | Wt 165.5 lb

## 2024-01-27 DIAGNOSIS — G44039 Episodic paroxysmal hemicrania, not intractable: Secondary | ICD-10-CM

## 2024-01-27 DIAGNOSIS — R42 Dizziness and giddiness: Secondary | ICD-10-CM

## 2024-01-27 MED ORDER — ONDANSETRON HCL 4 MG PO TABS: 4.0000 mg | ORAL_TABLET | Freq: Three times a day (TID) | ORAL | 0 refills | Status: DC | PRN

## 2024-01-27 MED ORDER — PANTOPRAZOLE SODIUM 40 MG PO TBEC: 40.0000 mg | DELAYED_RELEASE_TABLET | Freq: Every day | ORAL | 3 refills | Status: DC

## 2024-01-27 NOTE — Assessment & Plan Note (Addendum)
 Story/exam suspicious for peripheral vertigo likely BPPV r/o other cause of vertigo, vertiginous migraine, meniere's.  Treated in office with epley canalith repositioning maneuvers, premedicated with PO meclizine 25mg  x1.  Notes ongoing unsteadiness nausea and headache.  Refer to ENT for further evaluation of vertigo, r/o meniere's disease.  Rx zofran PRN nausea.

## 2024-01-27 NOTE — Assessment & Plan Note (Signed)
 S/p neurology eval, prior neurological eval (Dr HIll) consistent with cervicogenic headache that improved with PT.  Now having dull frontal headache associated with vertigo - see above.

## 2024-01-27 NOTE — Patient Instructions (Signed)
 I do think you have positional vertigo - may continue repositioning maneuvers.  Take meclizine prior to maneuvers.  We will refer you to ENT for a recheck. May use zofran as needed for nausea as well.

## 2024-01-27 NOTE — Progress Notes (Signed)
 Ph: [phone] Fax: [phone]   Patient ID: Martha Coleman, female    DOB: 08/26/1986, 38 y.o.   MRN: [id number]  This visit was conducted in person.  BP 122/82   Pulse 97   Temp 99.4 F (37.4 C) (Oral)   Ht 5' 3.5" (1.613 m)   Wt 165 lb 8 oz (75.1 kg)   LMP 01/03/2024   SpO2 98%   BMI 28.86 kg/m    Hearing Screening   500Hz  1000Hz  2000Hz  4000Hz   Right ear 20 20 20 20   Left ear 20 20 20 20    CC: dizziness, headache, n/v with vertigo  Subjective:   HPI: Martha Coleman is a 38 y.o. female presenting on 01/27/2024 for Dizziness (C/o dizziness, vomiting, HA and feeling of room spinning. Sxs started 01/26/24 while out of town at dance competition with daughters. Tended to by paramedics onsite- given IV fluids and Zofran. H/o vertigo. Pt accompanied by husband, Drew. Pt also tried Epley maneuver- helped when sxs were on R side but then moved to L side. )   1d h/o vertigo, vomiting, dizziness - started yesterday morning at 5am, worse with head turn to right. Tried home Epley maneuver - worsened things, caused vomiting x1. Seen by paramedics s/p IVF and phenergan IV treatment with some benefit. Staying dizzy, nauseated with sudden head movements. Vertigo episodes lasted seconds to minutes, but dizziness nausea and malaise lasted all day.  No associated hearing loss.  No fmhx or personal h/o meniere's disease.  She took meclizine with limited benefit.   She was with daughters in their dance competition in [place] this weekend.   Over the past few months, has noted R ear pain and ringing, again occurred over weekend. No hearing changes.   No recent respiratory infections.  No abd pain, diarrhea, new foods. No fevers/chills.  No unilateral weakness, numbness, slurred speech, double vision7.  Notes ongoing substernal chest discomfort down to RUQ associated with indigestion - restarted pantoprazole with benefit.   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  Saw Dr Loleta Chance neurology - found these were cervicogenic headaches - s/p PT with relief. Neck has been bothering her more recently.  Latest MRI reassuring 06/2023: 1.  No evidence of an acute intracranial abnormality. 2. 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 3. Otherwise unremarkable MRI appearance of the brain..   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 Interim medical history since our last visit reviewed. Allergies and medications reviewed and updated. Outpatient Medications Prior to Visit  Medication Sig Dispense Refill   solifenacin (VESICARE) 5 MG tablet Take 5 mg by mouth daily.     spironolactone (ALDACTONE) 100 MG tablet Take 100 mg by mouth daily. With food     Cetirizine HCl (ZYRTEC ALLERGY PO) Take by mouth.     oseltamivir (TAMIFLU) 75 MG capsule Take 1 capsule (75 mg total) by mouth daily. 10 capsule 0   oxybutynin (DITROPAN-XL) 5 MG 24 hr tablet Take 5 mg by mouth daily.     No facility-administered medications prior to visit.     Per HPI unless specifically indicated in ROS section below Review of Systems  Objective:  BP 122/82   Pulse 97   Temp 99.4 F (37.4 C) (Oral)   Ht 5' 3.5" (1.613 m)   Wt 165 lb 8 oz (75.1  kg)   LMP 01/03/2024   SpO2 98%   BMI 28.86 kg/m   Wt Readings from Last 3 Encounters:  01/27/24 165 lb 8 oz (75.1 kg)  11/07/23 166 lb (75.3 kg)  04/26/23 169 lb 4 oz (76.8 kg)      Physical Exam Vitals and nursing note reviewed.  Constitutional:      Appearance: Normal appearance. She is not ill-appearing.  HENT:     Head: Normocephalic and atraumatic.     Right Ear: Tympanic membrane, ear canal and external ear normal. There is no impacted cerumen.     Left Ear: Tympanic membrane, ear canal and external ear normal. There is no impacted cerumen.      Mouth/Throat:     Mouth: Mucous membranes are moist.     Pharynx: Oropharynx is clear. No oropharyngeal exudate or posterior oropharyngeal erythema.  Eyes:     Extraocular Movements: Extraocular movements intact.     Conjunctiva/sclera: Conjunctivae normal.     Pupils: Pupils are equal, round, and reactive to light.  Cardiovascular:     Rate and Rhythm: Normal rate and regular rhythm.     Pulses: Normal pulses.     Heart sounds: Normal heart sounds. No murmur heard. Pulmonary:     Effort: Pulmonary effort is normal. No respiratory distress.     Breath sounds: Normal breath sounds. No wheezing, rhonchi or rales.  Musculoskeletal:     Right lower leg: No edema.     Left lower leg: No edema.  Lymphadenopathy:     Head:     Right side of head: No submental, submandibular, tonsillar, preauricular or posterior auricular adenopathy.     Left side of head: No submental, submandibular, tonsillar, preauricular or posterior auricular adenopathy.     Cervical: No cervical adenopathy.     Right cervical: No superficial cervical adenopathy.    Left cervical: No superficial cervical adenopathy.     Upper Body:     Right upper body: No supraclavicular adenopathy.     Left upper body: No supraclavicular adenopathy.  Skin:    General: Skin is warm and dry.     Findings: No rash.  Neurological:     Mental Status: She is alert.     Cranial Nerves: Cranial nerves 2-12 are intact.     Sensory: Sensation is intact.     Motor: Motor function is intact.     Coordination: Coordination is intact.     Gait: Gait is intact.     Comments:  CN 2-12 intact FTN intact EOMI No ataxia, neg romberg ++ dix hallpike on right S/p epley repositioning maneuver performed  She remains unsteady  Psychiatric:        Mood and Affect: Mood normal.        Behavior: Behavior normal.       Results for orders placed or performed in visit on 11/07/23  CBC with Differential/Platelet   Collection Time: 11/07/23  3:21  PM  Result Value Ref Range   WBC 7.1 4.0 - 10.5 K/uL   RBC 4.95 3.87 - 5.11 Mil/uL   Hemoglobin 15.4 (H) 12.0 - 15.0 g/dL   HCT 16.1 09.6 - 04.5 %   MCV 89.3 78.0 - 100.0 fl   MCHC 34.8 30.0 - 36.0 g/dL   RDW 40.9 81.1 - 91.4 %   Platelets 296.0 150.0 - 400.0 K/uL   Neutrophils Relative % 67.3 43.0 - 77.0 %   Lymphocytes Relative 23.6 12.0 - 46.0 %  Monocytes Relative 4.8 3.0 - 12.0 %   Eosinophils Relative 3.7 0.0 - 5.0 %   Basophils Relative 0.6 0.0 - 3.0 %   Neutro Abs 4.8 1.4 - 7.7 K/uL   Lymphs Abs 1.7 0.7 - 4.0 K/uL   Monocytes Absolute 0.3 0.1 - 1.0 K/uL   Eosinophils Absolute 0.3 0.0 - 0.7 K/uL   Basophils Absolute 0.0 0.0 - 0.1 K/uL  TSH   Collection Time: 11/07/23  3:21 PM  Result Value Ref Range   TSH 3.53 0.35 - 5.50 uIU/mL    Assessment & Plan:   Problem List Items Addressed This Visit     Headache   S/p neurology eval, prior neurological eval (Dr HIll) consistent with cervicogenic headache that improved with PT.  Now having dull frontal headache associated with vertigo - see above.       Vertigo - Primary   Story/exam suspicious for peripheral vertigo likely BPPV r/o other cause of vertigo, vertiginous migraine, meniere's.  Treated in office with epley canalith repositioning maneuvers, premedicated with PO meclizine 25mg  x1.  Notes ongoing unsteadiness nausea and headache.  Refer to ENT for further evaluation of vertigo, r/o meniere's disease.  Rx zofran PRN nausea.       Relevant Orders   Ambulatory referral to ENT     Meds ordered this encounter  Medications   pantoprazole (PROTONIX) 40 MG tablet    Sig: Take 1 tablet (40 mg total) by mouth daily.    Dispense:  30 tablet    Refill:  3   ondansetron (ZOFRAN) 4 MG tablet    Sig: Take 1 tablet (4 mg total) by mouth every 8 (eight) hours as needed for nausea or vomiting.    Dispense:  20 tablet    Refill:  0    Orders Placed This Encounter  Procedures   Ambulatory referral to ENT    Referral  Priority:   Routine    Referral Type:   Consultation    Referral Reason:   Specialty Services Required    Requested Specialty:   Otolaryngology    Number of Visits Requested:   1    Patient Instructions  I do think you have positional vertigo - may continue repositioning maneuvers.  Take meclizine prior to maneuvers.  We will refer you to ENT for a recheck. May use zofran as needed for nausea as well.   Follow up plan: No follow-ups on file.  Eustaquio Boyden, MD

## 2024-01-28 ENCOUNTER — Encounter: Payer: Self-pay | Admitting: Family Medicine

## 2024-01-30 ENCOUNTER — Ambulatory Visit: Payer: Self-pay | Admitting: *Deleted

## 2024-01-30 NOTE — Telephone Encounter (Signed)
 See referral for updates.   Referral re-routed to [facility], patient notified via Mychart, everything faxed over.

## 2024-01-30 NOTE — Telephone Encounter (Signed)
 Follow up symptom call: Chief Complaint: Patient woke this morning- ready to go back to work- vertigo got worse Symptoms: dizziness when moves head, ear pain, vomiting  Frequency: started Sunday Pertinent Negatives: Patient denies headache, weakness, numbness,  Disposition: [] ED /[] Urgent Care (no appt availability in office) / [] Appointment(In office/virtual)/ []  [facility]/ [] Home Care/ [] Refused Recommended Disposition /[] [facility]/ [x]  Follow-up with PCP Additional Notes: Patient is calling to see if there is anything else provider can give her to help- she is unable to work , has pain in her ear and referral is so far out.    Copied from CRM [id number]. Topic: Clinical - Red Word Triage >> Jan 30, 2024  9:02 AM Drema Balzarine wrote: Red Word that prompted transfer to Nurse Triage: Patient seen Dr. Patrice Paradise Monday for vertigo, says she woke up feeling nauseous, dizzy and has no balance. Referred to ENT but her appointment isn't until June. Would like to know if there's a medication she can take in the meantime . Reason for Disposition  Earache  Answer Assessment - Initial Assessment Questions 1. DESCRIPTION: "Describe your dizziness."     Patient feels things are moving- Meclizine does not seem help, patient reports she does have some discomfort in R ear. ENT referral appointment is in June 2. VERTIGO: "Do you feel like either you or the room is spinning or tilting?"      Lifting head is making her nauseous- patient is vomiting 3. LIGHTHEADED: "Do you feel lightheaded?" (e.g., somewhat faint, woozy, weak upon standing)     no 4. SEVERITY: "How bad is it?"  "Can you walk?"   - MILD: Feels slightly dizzy and unsteady, but is walking normally.   - MODERATE: Feels unsteady when walking, but not falling; interferes with normal activities (e.g., school, work).   - SEVERE: Unable to walk without falling, or requires assistance to walk without falling.     Off balance-  mild/moderate 5. ONSET:  "When did the dizziness begin?"     Started Sunday am- comes and goes- did get some better- woke this morning with vertigo 6. AGGRAVATING FACTORS: "Does anything make it worse?" (e.g., standing, change in head position)     Moving head- when laying or sitting up 7. CAUSE: "What do you think is causing the dizziness?"     Possible inner ear problem 8. RECURRENT SYMPTOM: "Have you had dizziness before?" If Yes, ask: "When was the last time?" "What happened that time?"     In past- never this bad 9. OTHER SYMPTOMS: "Do you have any other symptoms?" (e.g., headache, weakness, numbness, vomiting, earache)     Earache- mild ache- 5-6 months ago patient has bleeding from that ear, nausea  Protocols used: Dizziness - Vertigo-A-AH

## 2024-01-30 NOTE — Addendum Note (Signed)
 Addended by: Eustaquio Boyden on: 01/30/2024 11:06 AM   Modules accepted: Orders

## 2024-01-30 NOTE — Telephone Encounter (Signed)
 Spoke with pt asking how she's taking OTC meclizine. States she takes it prior to doing maneuvers. Has not taken any today. But did take Zofran this AM after lifting head and vomiting once. Says she feels better while laying on L ear but room starts spinning when moving from that position.   I relayed Dr Timoteo Expose message about referral. Pt verbalizes understanding and states she also sent a message to Referral Coordinator/Team notifying them she contacted [facility] and was told they could see pt sooner but needed referral.

## 2024-01-30 NOTE — Telephone Encounter (Signed)
 Can we try to get patient in urgently  to ENT for vertigo r/o meniere's? Referral status changed

## 2024-01-30 NOTE — Telephone Encounter (Signed)
 I've changed referral status to urgent - ok to go to Dr Jenne Pane if they  can see her sooner. Thanks.

## 2024-01-30 NOTE — Telephone Encounter (Signed)
 Please check to see how she's currently taking the meclizine (dramamine) OTC?  I will also change ENT referral to urgent status.

## 2024-02-03 DIAGNOSIS — R42 Dizziness and giddiness: Secondary | ICD-10-CM | POA: Diagnosis not present

## 2024-02-04 NOTE — Telephone Encounter (Signed)
error 

## 2024-02-05 DIAGNOSIS — R42 Dizziness and giddiness: Secondary | ICD-10-CM | POA: Diagnosis not present

## 2024-04-20 ENCOUNTER — Other Ambulatory Visit: Payer: Self-pay | Admitting: Family Medicine

## 2024-04-20 DIAGNOSIS — Z13220 Encounter for screening for lipoid disorders: Secondary | ICD-10-CM

## 2024-04-20 DIAGNOSIS — Z148 Genetic carrier of other disease: Secondary | ICD-10-CM

## 2024-04-20 DIAGNOSIS — R7989 Other specified abnormal findings of blood chemistry: Secondary | ICD-10-CM

## 2024-04-23 ENCOUNTER — Other Ambulatory Visit: Payer: Self-pay | Admitting: Family Medicine

## 2024-04-24 ENCOUNTER — Other Ambulatory Visit: Payer: BC Managed Care – PPO

## 2024-04-24 DIAGNOSIS — Z136 Encounter for screening for cardiovascular disorders: Secondary | ICD-10-CM

## 2024-04-24 DIAGNOSIS — Z13220 Encounter for screening for lipoid disorders: Secondary | ICD-10-CM | POA: Diagnosis not present

## 2024-04-24 DIAGNOSIS — R945 Abnormal results of liver function studies: Secondary | ICD-10-CM | POA: Diagnosis not present

## 2024-04-24 DIAGNOSIS — Z148 Genetic carrier of other disease: Secondary | ICD-10-CM

## 2024-04-24 DIAGNOSIS — R7989 Other specified abnormal findings of blood chemistry: Secondary | ICD-10-CM

## 2024-04-24 LAB — COMPREHENSIVE METABOLIC PANEL WITH GFR
ALT: 14 U/L (ref 0–35)
AST: 11 U/L (ref 0–37)
Albumin: 4.2 g/dL (ref 3.5–5.2)
Alkaline Phosphatase: 38 U/L — ABNORMAL LOW (ref 39–117)
BUN: 13 mg/dL (ref 6–23)
CO2: 27 meq/L (ref 19–32)
Calcium: 9.1 mg/dL (ref 8.4–10.5)
Chloride: 105 meq/L (ref 96–112)
Creatinine, Ser: 0.85 mg/dL (ref 0.40–1.20)
GFR: 87.65 mL/min (ref >60.00)
Glucose, Bld: 85 mg/dL (ref 70–99)
Potassium: 4 meq/L (ref 3.5–5.1)
Sodium: 140 meq/L (ref 135–145)
Total Bilirubin: 0.6 mg/dL (ref 0.2–1.2)
Total Protein: 6.6 g/dL (ref 6.0–8.3)

## 2024-04-24 LAB — CBC WITH DIFFERENTIAL/PLATELET
Basophils Absolute: 0 10*3/uL (ref 0.0–0.1)
Basophils Relative: 0.4 % (ref 0.0–3.0)
Eosinophils Absolute: 0.4 10*3/uL (ref 0.0–0.7)
Eosinophils Relative: 4.1 % (ref 0.0–5.0)
HCT: 41.5 % (ref 36.0–46.0)
Hemoglobin: 14.6 g/dL (ref 12.0–15.0)
Lymphocytes Relative: 15.1 % (ref 12.0–46.0)
Lymphs Abs: 1.5 10*3/uL (ref 0.7–4.0)
MCHC: 35.1 g/dL (ref 30.0–36.0)
MCV: 87.6 fl (ref 78.0–100.0)
Monocytes Absolute: 0.5 10*3/uL (ref 0.1–1.0)
Monocytes Relative: 5.5 % (ref 3.0–12.0)
Neutro Abs: 7.3 10*3/uL (ref 1.4–7.7)
Neutrophils Relative %: 74.9 % (ref 43.0–77.0)
Platelets: 260 10*3/uL (ref 150.0–400.0)
RBC: 4.74 Mil/uL (ref 3.87–5.11)
RDW: 12.5 % (ref 11.5–15.5)
WBC: 9.7 10*3/uL (ref 4.0–10.5)

## 2024-04-24 LAB — LIPID PANEL
Cholesterol: 165 mg/dL (ref 0–200)
HDL: 42.7 mg/dL (ref >39.00)
LDL Cholesterol: 111 mg/dL — ABNORMAL HIGH (ref 0–99)
NonHDL: 122.79
Total CHOL/HDL Ratio: 4
Triglycerides: 61 mg/dL (ref 0.0–149.0)
VLDL: 12.2 mg/dL (ref 0.0–40.0)

## 2024-04-24 LAB — IBC PANEL
Iron: 101 ug/dL (ref 42–145)
Saturation Ratios: 27.7 % (ref 20.0–50.0)
TIBC: 364 ug/dL (ref 250.0–450.0)
Transferrin: 260 mg/dL (ref 212.0–360.0)

## 2024-04-24 LAB — TSH: TSH: 3.94 u[IU]/mL (ref 0.35–5.50)

## 2024-04-24 LAB — FERRITIN: Ferritin: 17.2 ng/mL (ref 10.0–291.0)

## 2024-04-24 LAB — T4, FREE: Free T4: 0.67 ng/dL (ref 0.60–1.60)

## 2024-04-26 ENCOUNTER — Ambulatory Visit: Payer: Self-pay | Admitting: Family Medicine

## 2024-04-27 ENCOUNTER — Institutional Professional Consult (permissible substitution) (INDEPENDENT_AMBULATORY_CARE_PROVIDER_SITE_OTHER)

## 2024-04-27 ENCOUNTER — Ambulatory Visit (INDEPENDENT_AMBULATORY_CARE_PROVIDER_SITE_OTHER): Admitting: Audiology

## 2024-05-01 ENCOUNTER — Ambulatory Visit (INDEPENDENT_AMBULATORY_CARE_PROVIDER_SITE_OTHER): Payer: BC Managed Care – PPO | Admitting: Family Medicine

## 2024-05-01 ENCOUNTER — Encounter: Payer: Self-pay | Admitting: Family Medicine

## 2024-05-01 VITALS — BP 116/84 | HR 82 | Temp 98.4°F | Ht 63.25 in | Wt 170.0 lb

## 2024-05-01 DIAGNOSIS — R7989 Other specified abnormal findings of blood chemistry: Secondary | ICD-10-CM

## 2024-05-01 DIAGNOSIS — M940 Chondrocostal junction syndrome [Tietze]: Secondary | ICD-10-CM | POA: Insufficient documentation

## 2024-05-01 DIAGNOSIS — R42 Dizziness and giddiness: Secondary | ICD-10-CM

## 2024-05-01 DIAGNOSIS — Z0000 Encounter for general adult medical examination without abnormal findings: Secondary | ICD-10-CM

## 2024-05-01 DIAGNOSIS — Z8349 Family history of other endocrine, nutritional and metabolic diseases: Secondary | ICD-10-CM

## 2024-05-01 DIAGNOSIS — M1990 Unspecified osteoarthritis, unspecified site: Secondary | ICD-10-CM

## 2024-05-01 DIAGNOSIS — Z809 Family history of malignant neoplasm, unspecified: Secondary | ICD-10-CM | POA: Insufficient documentation

## 2024-05-01 NOTE — Progress Notes (Signed)
 Ph: [phone] Fax: [phone]   Patient ID: Martha Coleman, female    DOB: 07-09-1986, 38 y.o.   MRN: [id number]  This visit was conducted in person.  BP 116/84   Pulse 82   Temp 98.4 F (36.9 C) (Oral)   Ht 5' 3.25 (1.607 m)   Wt 170 lb (77.1 kg)   LMP 04/23/2024   SpO2 99%   BMI 29.88 kg/m    CC: CPE Subjective:   HPI: AMANDAJO Coleman is a 38 y.o. female presenting on 05/01/2024 for Annual Exam   Lots of family deaths in the past 2 yrs -  Family History  Problem Relation Age of Onset   Hemochromatosis Mother        homozygous for H63D mutation   Hyperlipidemia Mother    Hypertension Mother    Hypertension Father    Migraines Brother    ADD / ADHD Brother    Diabetes Maternal Grandmother    Hypertension Maternal Grandmother    COPD Maternal Grandmother    Anxiety disorder Daughter    Breast cancer Maternal Aunt 20 - 49   Leukemia Maternal Aunt 50 - 59   Lung cancer Maternal Aunt 40 - 49   Renal cancer Paternal Aunt 30 - 59       renal cell   Heart disease Cousin 20 - 29       tachycardia   Saw ENT Dr Carlie for vertigo - had reassuring hearing evaluation. Rx valium 5mg  PRN. Brain MRI 06/2023 reassuring.   Saw rheumatologist Cathryne Stark NP [place]) [facility] for joint pain - dx seronegative RA vs psoriatic arthritis, however insurance wouldn't cover Enbrel. Failed NSAIDs.   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  Saw Dr Leigh neurology - found these were cervicogenic headaches - s/p PT with benefit, latest 08/2023. Neck has been bothering her more recently.  Latest MRI reassuring 06/2023: 1.  No evidence of an acute intracranial abnormality. 2. 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 3. Otherwise unremarkable MRI appearance of the brain..    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   Notes recurrent left sided substernal chest discomfort  described as sharp pain, reproducible with palpation, worse with working out. No new workout routine. PPI hasn't helped, hasn't tried anything else. This has been ongoing for 6 months, overall unchanged.   Preventative: Colonoscopy 11/2022 - SSP rpt 3 yrs, done for episode of rectal bleeding [name])  Well woman - through [facility] GYN Dr Rolan. Normal paps in the past. Taking amitriptyline for IC through GYN. Last seen Fall 2024  Reassuring mammo/US  with biopsy 07/2022 - fibrocystic changes with PASH.  DEXA scan - not due Flu shot yearly COVID vaccine - declined Completed Hep B series  Tdap 2012, 2017 Seat belt use discussed  Sunscreen use discussed. No changing moles on skin.  Sleep - averaging 6-7 hours/night  Non smoker  Alcohol - seldom  Dentist - yearly  Eye exam - 2024   Lives at home with family Right handed Caffeine: 1 cup/day Occ: works at dermatology office Activity: limited due to joint pain, trying to bike more regularly  Diet: water 36 oz/day, fruits/vegetables daily      Relevant past medical, surgical, family and social history reviewed and updated as indicated. Interim medical history since our last visit reviewed. Allergies and medications reviewed and updated. Outpatient Medications Prior to Visit  Medication Sig Dispense Refill  pantoprazole  (PROTONIX ) 40 MG tablet TAKE 1 TABLET BY MOUTH EVERY DAY 90 tablet 0   solifenacin (VESICARE) 5 MG tablet Take 5 mg by mouth daily.     spironolactone (ALDACTONE) 100 MG tablet Take 100 mg by mouth daily. With food     ondansetron  (ZOFRAN ) 4 MG tablet Take 1 tablet (4 mg total) by mouth every 8 (eight) hours as needed for nausea or vomiting. 20 tablet 0   No facility-administered medications prior to visit.     Per HPI unless specifically indicated in ROS section below Review of Systems  Constitutional:  Negative for activity change, appetite change, chills, fatigue, fever and unexpected weight change.  HENT:  Negative for  hearing loss.   Eyes:  Negative for visual disturbance.  Respiratory:  Negative for cough, chest tightness, shortness of breath and wheezing.   Cardiovascular:  Positive for chest pain (occ - see above). Negative for palpitations and leg swelling.  Gastrointestinal:  Negative for abdominal distention, abdominal pain, blood in stool, constipation, diarrhea, nausea and vomiting.  Genitourinary:  Negative for difficulty urinating and hematuria.  Musculoskeletal:  Negative for arthralgias, myalgias and neck pain.  Skin:  Negative for rash.  Neurological:  Negative for dizziness, seizures, syncope and headaches.  Hematological:  Negative for adenopathy. Does not bruise/bleed easily.  Psychiatric/Behavioral:  Negative for dysphoric mood. The patient is not nervous/anxious.     Objective:  BP 116/84   Pulse 82   Temp 98.4 F (36.9 C) (Oral)   Ht 5' 3.25 (1.607 m)   Wt 170 lb (77.1 kg)   LMP 04/23/2024   SpO2 99%   BMI 29.88 kg/m   Wt Readings from Last 3 Encounters:  05/01/24 170 lb (77.1 kg)  01/27/24 165 lb 8 oz (75.1 kg)  11/07/23 166 lb (75.3 kg)      Physical Exam Vitals and nursing note reviewed.  Constitutional:      Appearance: Normal appearance. She is not ill-appearing.  HENT:     Head: Normocephalic and atraumatic.     Right Ear: Tympanic membrane, ear canal and external ear normal. There is no impacted cerumen.     Left Ear: Tympanic membrane, ear canal and external ear normal. There is no impacted cerumen.     Mouth/Throat:     Mouth: Mucous membranes are moist.     Pharynx: Oropharynx is clear. No oropharyngeal exudate or posterior oropharyngeal erythema.   Eyes:     General:        Right eye: No discharge.        Left eye: No discharge.     Extraocular Movements: Extraocular movements intact.     Conjunctiva/sclera: Conjunctivae normal.     Pupils: Pupils are equal, round, and reactive to light.   Neck:     Thyroid : No thyroid  mass or thyromegaly.    Cardiovascular:     Rate and Rhythm: Normal rate and regular rhythm.     Pulses: Normal pulses.     Heart sounds: Normal heart sounds. No murmur heard. Pulmonary:     Effort: Pulmonary effort is normal. No respiratory distress.     Breath sounds: Normal breath sounds. No wheezing, rhonchi or rales.  Chest:     Chest wall: Tenderness (reproducible discomfort to palpation of left 3rd costochondral junction with some swelling present, no redness) present.   Abdominal:     General: Bowel sounds are normal. There is no distension.     Palpations: Abdomen is soft. There is no  mass.     Tenderness: There is no abdominal tenderness. There is no guarding or rebound.     Hernia: No hernia is present.   Musculoskeletal:     Cervical back: Normal range of motion and neck supple. No rigidity.     Right lower leg: No edema.     Left lower leg: No edema.  Lymphadenopathy:     Cervical: No cervical adenopathy.   Skin:    General: Skin is warm and dry.     Findings: No rash.   Neurological:     General: No focal deficit present.     Mental Status: She is alert. Mental status is at baseline.   Psychiatric:        Mood and Affect: Mood normal.        Behavior: Behavior normal.       Results for orders placed or performed in visit on 04/24/24  CBC with Differential/Platelet   Collection Time: 04/24/24  7:29 AM  Result Value Ref Range   WBC 9.7 4.0 - 10.5 K/uL   RBC 4.74 3.87 - 5.11 Mil/uL   Hemoglobin 14.6 12.0 - 15.0 g/dL   HCT 58.4 63.9 - 53.9 %   MCV 87.6 78.0 - 100.0 fl   MCHC 35.1 30.0 - 36.0 g/dL   RDW 87.4 88.4 - 84.4 %   Platelets 260.0 150.0 - 400.0 K/uL   Neutrophils Relative % 74.9 43.0 - 77.0 %   Lymphocytes Relative 15.1 12.0 - 46.0 %   Monocytes Relative 5.5 3.0 - 12.0 %   Eosinophils Relative 4.1 0.0 - 5.0 %   Basophils Relative 0.4 0.0 - 3.0 %   Neutro Abs 7.3 1.4 - 7.7 K/uL   Lymphs Abs 1.5 0.7 - 4.0 K/uL   Monocytes Absolute 0.5 0.1 - 1.0 K/uL   Eosinophils  Absolute 0.4 0.0 - 0.7 K/uL   Basophils Absolute 0.0 0.0 - 0.1 K/uL  Lipid panel   Collection Time: 04/24/24  7:29 AM  Result Value Ref Range   Cholesterol 165 0 - 200 mg/dL   Triglycerides 38.9 0.0 - 149.0 mg/dL   HDL 57.29 >60.99 mg/dL   VLDL 87.7 0.0 - 59.9 mg/dL   LDL Cholesterol 888 (H) 0 - 99 mg/dL   Total CHOL/HDL Ratio 4    NonHDL 122.79   Ferritin   Collection Time: 04/24/24  7:29 AM  Result Value Ref Range   Ferritin 17.2 10.0 - 291.0 ng/mL  IBC panel   Collection Time: 04/24/24  7:29 AM  Result Value Ref Range   Iron 101 42 - 145 ug/dL   Transferrin 739.9 787.9 - 360.0 mg/dL   Saturation Ratios 72.2 20.0 - 50.0 %   TIBC 364.0 250.0 - 450.0 mcg/dL  TSH   Collection Time: 04/24/24  7:29 AM  Result Value Ref Range   TSH 3.94 0.35 - 5.50 uIU/mL  Comprehensive metabolic panel with GFR   Collection Time: 04/24/24  7:29 AM  Result Value Ref Range   Sodium 140 135 - 145 mEq/L   Potassium 4.0 3.5 - 5.1 mEq/L   Chloride 105 96 - 112 mEq/L   CO2 27 19 - 32 mEq/L   Glucose, Bld 85 70 - 99 mg/dL   BUN 13 6 - 23 mg/dL   Creatinine, Ser 9.14 0.40 - 1.20 mg/dL   Total Bilirubin 0.6 0.2 - 1.2 mg/dL   Alkaline Phosphatase 38 (L) 39 - 117 U/L   AST 11 0 - 37 U/L  ALT 14 0 - 35 U/L   Total Protein 6.6 6.0 - 8.3 g/dL   Albumin 4.2 3.5 - 5.2 g/dL   GFR 12.34 >39.99 mL/min   Calcium  9.1 8.4 - 10.5 mg/dL  T4, free   Collection Time: 04/24/24  7:29 AM  Result Value Ref Range   Free T4 0.67 0.60 - 1.60 ng/dL    Assessment & Plan:   Problem List Items Addressed This Visit     Health maintenance examination - Primary (Chronic)   Preventative protocols reviewed and updated unless pt declined. Discussed healthy diet and lifestyle.       Family history of hemochromatosis   Iron levels and LFTs remain ok.       Abnormal TSH   Anticipate very mild borderline hypothyroidism. Will continue to monitor.       Vertigo   Was dealing with this back in 01/2024. This improved  over time- she actually tested positive for influenza - wonders if post-viral vertigo. She did see ENT as well.       Inflammatory arthritis   H/o this. Has seen [place] [facility] - ?costochondritis related to this.  NSAIDs previously ineffective, would avoid in h/o gastroesophageal inflammation/GERD      Costochondritis   Describes chronic left costochondral inflammation. In h/o inflammatory arthritis ?related.  Rec start with conservative measures including ice/heat, topical NSAID, gentle stretching.  Update if not improved with this.       Family history of cancer   Multiple recent cancer diagnoses in different second degree relatives (breast, blood, lung, kidney).  Discussed [employer], brochure provided.         No orders of the defined types were placed in this encounter.   No orders of the defined types were placed in this encounter.   Patient Instructions  Treat left sided costochondritis with ice or heat - whichever soothes better, topical voltaren (diclofenac) 2-3 times daily as needed, gentle stretching. Let us  know if not better.  Good to see you today Return as needed or in 1 year for next physical  Follow up plan: Return in about 1 year (around 05/01/2025) for annual exam, prior fasting for blood work.  Anton Blas, MD

## 2024-05-01 NOTE — Assessment & Plan Note (Signed)
 Preventative protocols reviewed and updated unless pt declined. Discussed healthy diet and lifestyle.

## 2024-05-01 NOTE — Assessment & Plan Note (Signed)
 Iron levels and LFTs remain ok.

## 2024-05-01 NOTE — Assessment & Plan Note (Signed)
 Describes chronic left costochondral inflammation. In h/o inflammatory arthritis ?related.  Rec start with conservative measures including ice/heat, topical NSAID, gentle stretching.  Update if not improved with this.

## 2024-05-01 NOTE — Patient Instructions (Signed)
 Treat left sided costochondritis with ice or heat - whichever soothes better, topical voltaren (diclofenac) 2-3 times daily as needed, gentle stretching. Let us  know if not better.  Good to see you today Return as needed or in 1 year for next physical

## 2024-05-01 NOTE — Assessment & Plan Note (Addendum)
 H/o this. Has seen [place] [facility] - ?costochondritis related to this.  NSAIDs previously ineffective, would avoid in h/o gastroesophageal inflammation/GERD

## 2024-05-01 NOTE — Assessment & Plan Note (Addendum)
 Was dealing with this back in 01/2024. This improved over time- she actually tested positive for influenza - wonders if post-viral vertigo. She did see ENT as well.

## 2024-05-01 NOTE — Assessment & Plan Note (Addendum)
 Multiple recent cancer diagnoses in different second degree relatives (breast, blood, lung, kidney).  Discussed [employer], brochure provided.

## 2024-05-01 NOTE — Assessment & Plan Note (Signed)
 Anticipate very mild borderline hypothyroidism. Will continue to monitor.

## 2024-10-27 ENCOUNTER — Encounter: Payer: Self-pay | Admitting: Family Medicine

## 2024-10-27 MED ORDER — OSELTAMIVIR PHOSPHATE 75 MG PO CAPS: 75.0000 mg | ORAL_CAPSULE | Freq: Every day | ORAL | 0 refills | Status: AC

## 2024-11-13 ENCOUNTER — Telehealth: Payer: Self-pay

## 2024-11-13 ENCOUNTER — Other Ambulatory Visit: Payer: Self-pay | Admitting: Obstetrics and Gynecology

## 2024-11-13 DIAGNOSIS — N644 Mastodynia: Secondary | ICD-10-CM

## 2024-11-13 NOTE — Telephone Encounter (Signed)
 Patient telephoned and after completing screening process is ineligible for [facility] program. Advised patient to call [facility] for self pay options and contact [employer] [facility].

## 2024-12-16 ENCOUNTER — Other Ambulatory Visit

## 2024-12-16 ENCOUNTER — Encounter

## 2025-05-06 ENCOUNTER — Other Ambulatory Visit

## 2025-05-14 ENCOUNTER — Encounter: Admitting: Family Medicine
